# Patient Record
Sex: Female | Born: 1940 | Race: White | Hispanic: No | Marital: Married | Smoking: Never smoker
Health system: Southern US, Community
[De-identification: ages and names within clinical notes are randomized; demographics above are authoritative.]

## PROBLEM LIST (undated history)

## (undated) DIAGNOSIS — K2970 Gastritis, unspecified, without bleeding: Secondary | ICD-10-CM

## (undated) DIAGNOSIS — T7840XA Allergy, unspecified, initial encounter: Secondary | ICD-10-CM

## (undated) DIAGNOSIS — J40 Bronchitis, not specified as acute or chronic: Secondary | ICD-10-CM

## (undated) DIAGNOSIS — B029 Zoster without complications: Secondary | ICD-10-CM

## (undated) DIAGNOSIS — I447 Left bundle-branch block, unspecified: Secondary | ICD-10-CM

## (undated) DIAGNOSIS — M797 Fibromyalgia: Secondary | ICD-10-CM

## (undated) DIAGNOSIS — I519 Heart disease, unspecified: Secondary | ICD-10-CM

## (undated) DIAGNOSIS — Q249 Congenital malformation of heart, unspecified: Secondary | ICD-10-CM

## (undated) DIAGNOSIS — K219 Gastro-esophageal reflux disease without esophagitis: Secondary | ICD-10-CM

## (undated) DIAGNOSIS — E785 Hyperlipidemia, unspecified: Secondary | ICD-10-CM

## (undated) DIAGNOSIS — C801 Malignant (primary) neoplasm, unspecified: Secondary | ICD-10-CM

## (undated) DIAGNOSIS — M1990 Unspecified osteoarthritis, unspecified site: Secondary | ICD-10-CM

## (undated) DIAGNOSIS — K589 Irritable bowel syndrome without diarrhea: Secondary | ICD-10-CM

## (undated) HISTORY — DX: Allergy, unspecified, initial encounter: T78.40XA

## (undated) HISTORY — DX: Unspecified osteoarthritis, unspecified site: M19.90

## (undated) HISTORY — DX: Fibromyalgia: M79.7

## (undated) HISTORY — DX: Irritable bowel syndrome, unspecified: K58.9

## (undated) HISTORY — DX: Hyperlipidemia, unspecified: E78.5

## (undated) HISTORY — PX: GANGLION CYST EXCISION: SHX1691

## (undated) HISTORY — DX: Bronchitis, not specified as acute or chronic: J40

## (undated) HISTORY — PX: TONSILLECTOMY: SHX5217

## (undated) HISTORY — DX: Malignant (primary) neoplasm, unspecified: C80.1

## (undated) HISTORY — DX: Gastritis, unspecified, without bleeding: K29.70

## (undated) HISTORY — DX: Gastro-esophageal reflux disease without esophagitis: K21.9

## (undated) HISTORY — DX: Congenital malformation of heart, unspecified: Q24.9

## (undated) HISTORY — DX: Heart disease, unspecified: I51.9

## (undated) HISTORY — PX: MASTECTOMY, PARTIAL: SHX709

## (undated) HISTORY — DX: Left bundle-branch block, unspecified: I44.7

---

## 1961-06-10 HISTORY — PX: TONSILLECTOMY: SUR1361

## 1999-11-21 ENCOUNTER — Ambulatory Visit (HOSPITAL_COMMUNITY): Admission: RE | Admit: 1999-11-21 | Discharge: 1999-11-21 | Payer: Self-pay | Admitting: Gastroenterology

## 1999-11-28 ENCOUNTER — Encounter: Payer: Self-pay | Admitting: Gynecology

## 1999-11-28 ENCOUNTER — Encounter: Admission: RE | Admit: 1999-11-28 | Discharge: 1999-11-28 | Payer: Self-pay | Admitting: Gynecology

## 2000-10-13 ENCOUNTER — Other Ambulatory Visit: Admission: RE | Admit: 2000-10-13 | Discharge: 2000-10-13 | Payer: Self-pay | Admitting: Gynecology

## 2000-12-25 ENCOUNTER — Encounter: Admission: RE | Admit: 2000-12-25 | Discharge: 2000-12-25 | Payer: Self-pay | Admitting: Gynecology

## 2000-12-25 ENCOUNTER — Encounter: Payer: Self-pay | Admitting: Gynecology

## 2001-10-28 ENCOUNTER — Other Ambulatory Visit: Admission: RE | Admit: 2001-10-28 | Discharge: 2001-10-28 | Payer: Self-pay | Admitting: Gynecology

## 2002-01-26 ENCOUNTER — Ambulatory Visit (HOSPITAL_COMMUNITY): Admission: RE | Admit: 2002-01-26 | Discharge: 2002-01-26 | Payer: Self-pay | Admitting: Gastroenterology

## 2002-04-26 ENCOUNTER — Encounter: Admission: RE | Admit: 2002-04-26 | Discharge: 2002-04-26 | Payer: Self-pay | Admitting: Gynecology

## 2002-04-26 ENCOUNTER — Encounter: Payer: Self-pay | Admitting: Gynecology

## 2002-04-29 ENCOUNTER — Encounter (INDEPENDENT_AMBULATORY_CARE_PROVIDER_SITE_OTHER): Payer: Self-pay | Admitting: *Deleted

## 2002-04-29 ENCOUNTER — Encounter: Admission: RE | Admit: 2002-04-29 | Discharge: 2002-04-29 | Payer: Self-pay | Admitting: Gynecology

## 2002-04-29 ENCOUNTER — Other Ambulatory Visit: Admission: RE | Admit: 2002-04-29 | Discharge: 2002-04-29 | Payer: Self-pay | Admitting: Radiology

## 2002-04-29 ENCOUNTER — Encounter: Payer: Self-pay | Admitting: Gynecology

## 2002-05-12 ENCOUNTER — Encounter: Admission: RE | Admit: 2002-05-12 | Discharge: 2002-05-12 | Payer: Self-pay | Admitting: Surgery

## 2002-05-12 ENCOUNTER — Encounter: Payer: Self-pay | Admitting: Surgery

## 2002-05-13 ENCOUNTER — Ambulatory Visit (HOSPITAL_BASED_OUTPATIENT_CLINIC_OR_DEPARTMENT_OTHER): Admission: RE | Admit: 2002-05-13 | Discharge: 2002-05-14 | Payer: Self-pay | Admitting: Surgery

## 2002-05-13 ENCOUNTER — Encounter (INDEPENDENT_AMBULATORY_CARE_PROVIDER_SITE_OTHER): Payer: Self-pay | Admitting: Specialist

## 2002-05-13 ENCOUNTER — Encounter: Payer: Self-pay | Admitting: Surgery

## 2002-05-15 HISTORY — PX: BREAST SURGERY: SHX581

## 2002-05-25 ENCOUNTER — Ambulatory Visit: Admission: RE | Admit: 2002-05-25 | Discharge: 2002-05-31 | Payer: Self-pay | Admitting: Radiation Oncology

## 2002-06-04 ENCOUNTER — Ambulatory Visit: Admission: RE | Admit: 2002-06-04 | Discharge: 2002-08-22 | Payer: Self-pay | Admitting: Radiation Oncology

## 2002-09-21 ENCOUNTER — Encounter: Payer: Self-pay | Admitting: Oncology

## 2002-09-21 ENCOUNTER — Ambulatory Visit (HOSPITAL_COMMUNITY): Admission: RE | Admit: 2002-09-21 | Discharge: 2002-09-21 | Payer: Self-pay | Admitting: Oncology

## 2002-11-19 ENCOUNTER — Encounter: Payer: Self-pay | Admitting: Oncology

## 2002-11-19 ENCOUNTER — Ambulatory Visit (HOSPITAL_COMMUNITY): Admission: RE | Admit: 2002-11-19 | Discharge: 2002-11-19 | Payer: Self-pay | Admitting: Oncology

## 2003-01-03 ENCOUNTER — Other Ambulatory Visit: Admission: RE | Admit: 2003-01-03 | Discharge: 2003-01-03 | Payer: Self-pay | Admitting: Gynecology

## 2003-05-02 ENCOUNTER — Encounter: Admission: RE | Admit: 2003-05-02 | Discharge: 2003-05-02 | Payer: Self-pay | Admitting: Oncology

## 2004-04-18 ENCOUNTER — Other Ambulatory Visit: Admission: RE | Admit: 2004-04-18 | Discharge: 2004-04-18 | Payer: Self-pay | Admitting: Gynecology

## 2004-05-14 ENCOUNTER — Encounter: Admission: RE | Admit: 2004-05-14 | Discharge: 2004-05-14 | Payer: Self-pay | Admitting: Gynecology

## 2004-07-18 ENCOUNTER — Encounter: Admission: RE | Admit: 2004-07-18 | Discharge: 2004-07-18 | Payer: Self-pay | Admitting: Oncology

## 2004-08-17 ENCOUNTER — Ambulatory Visit: Payer: Self-pay | Admitting: Oncology

## 2004-10-18 ENCOUNTER — Ambulatory Visit: Payer: Self-pay | Admitting: Oncology

## 2005-05-22 ENCOUNTER — Other Ambulatory Visit: Admission: RE | Admit: 2005-05-22 | Discharge: 2005-05-22 | Payer: Self-pay | Admitting: Gynecology

## 2005-05-29 ENCOUNTER — Encounter: Admission: RE | Admit: 2005-05-29 | Discharge: 2005-05-29 | Payer: Self-pay | Admitting: Oncology

## 2005-10-18 ENCOUNTER — Ambulatory Visit: Payer: Self-pay | Admitting: Oncology

## 2005-10-22 LAB — CBC WITH DIFFERENTIAL/PLATELET
BASO%: 0.5 % (ref 0.0–2.0)
Eosinophils Absolute: 0.1 10*3/uL (ref 0.0–0.5)
HCT: 43.3 % (ref 34.8–46.6)
MCHC: 33.4 g/dL (ref 32.0–36.0)
MONO#: 0.8 10*3/uL (ref 0.1–0.9)
NEUT#: 5.8 10*3/uL (ref 1.5–6.5)
NEUT%: 69.5 % (ref 39.6–76.8)
RBC: 4.99 10*6/uL (ref 3.70–5.32)
WBC: 8.3 10*3/uL (ref 3.9–10.0)
lymph#: 1.6 10*3/uL (ref 0.9–3.3)

## 2005-10-22 LAB — COMPREHENSIVE METABOLIC PANEL
ALT: 20 U/L (ref 0–40)
AST: 23 U/L (ref 0–37)
Albumin: 4.6 g/dL (ref 3.5–5.2)
CO2: 24 mEq/L (ref 19–32)
Calcium: 9.9 mg/dL (ref 8.4–10.5)
Chloride: 101 mEq/L (ref 96–112)
Potassium: 4.4 mEq/L (ref 3.5–5.3)

## 2005-10-22 LAB — CANCER ANTIGEN 27.29: CA 27.29: 60 U/mL — ABNORMAL HIGH (ref 0–39)

## 2005-10-31 ENCOUNTER — Encounter: Admission: RE | Admit: 2005-10-31 | Discharge: 2005-10-31 | Payer: Self-pay | Admitting: Oncology

## 2006-05-21 ENCOUNTER — Ambulatory Visit: Payer: Self-pay | Admitting: Oncology

## 2006-06-11 ENCOUNTER — Encounter: Admission: RE | Admit: 2006-06-11 | Discharge: 2006-06-11 | Payer: Self-pay | Admitting: Gynecology

## 2006-06-11 ENCOUNTER — Ambulatory Visit (HOSPITAL_COMMUNITY): Admission: RE | Admit: 2006-06-11 | Discharge: 2006-06-11 | Payer: Self-pay | Admitting: Oncology

## 2006-06-12 LAB — COMPREHENSIVE METABOLIC PANEL
ALT: 19 U/L (ref 0–35)
AST: 19 U/L (ref 0–37)
Calcium: 9.9 mg/dL (ref 8.4–10.5)
Chloride: 104 mEq/L (ref 96–112)
Creatinine, Ser: 0.73 mg/dL (ref 0.40–1.20)
Potassium: 4.3 mEq/L (ref 3.5–5.3)
Sodium: 138 mEq/L (ref 135–145)

## 2006-06-12 LAB — CBC WITH DIFFERENTIAL/PLATELET
BASO%: 0.7 % (ref 0.0–2.0)
EOS%: 2.1 % (ref 0.0–7.0)
MCH: 27 pg (ref 26.0–34.0)
MCHC: 31.7 g/dL — ABNORMAL LOW (ref 32.0–36.0)
RBC: 5.3 10*6/uL (ref 3.70–5.32)
RDW: 13.7 % (ref 11.3–14.5)
lymph#: 1.5 10*3/uL (ref 0.9–3.3)

## 2006-07-28 ENCOUNTER — Ambulatory Visit: Payer: Self-pay | Admitting: Oncology

## 2006-07-31 LAB — CBC WITH DIFFERENTIAL/PLATELET
Basophils Absolute: 0.1 10*3/uL (ref 0.0–0.1)
EOS%: 1.5 % (ref 0.0–7.0)
HGB: 14.9 g/dL (ref 11.6–15.9)
MCH: 28.3 pg (ref 26.0–34.0)
MCV: 82.5 fL (ref 81.0–101.0)
MONO%: 8.9 % (ref 0.0–13.0)
NEUT%: 71.8 % (ref 39.6–76.8)
RDW: 13.4 % (ref 11.3–14.5)

## 2006-07-31 LAB — COMPREHENSIVE METABOLIC PANEL
AST: 21 U/L (ref 0–37)
Alkaline Phosphatase: 160 U/L — ABNORMAL HIGH (ref 39–117)
BUN: 14 mg/dL (ref 6–23)
Creatinine, Ser: 0.96 mg/dL (ref 0.40–1.20)
Potassium: 4.2 mEq/L (ref 3.5–5.3)
Total Bilirubin: 0.3 mg/dL (ref 0.3–1.2)

## 2006-08-04 ENCOUNTER — Other Ambulatory Visit: Admission: RE | Admit: 2006-08-04 | Discharge: 2006-08-04 | Payer: Self-pay | Admitting: Gynecology

## 2006-08-12 ENCOUNTER — Ambulatory Visit (HOSPITAL_COMMUNITY): Admission: RE | Admit: 2006-08-12 | Discharge: 2006-08-12 | Payer: Self-pay | Admitting: Oncology

## 2006-09-24 ENCOUNTER — Ambulatory Visit: Payer: Self-pay | Admitting: Oncology

## 2006-09-25 LAB — CBC WITH DIFFERENTIAL/PLATELET
Basophils Absolute: 0 10*3/uL (ref 0.0–0.1)
Eosinophils Absolute: 0.1 10*3/uL (ref 0.0–0.5)
HCT: 41.8 % (ref 34.8–46.6)
HGB: 14.5 g/dL (ref 11.6–15.9)
LYMPH%: 18.7 % (ref 14.0–48.0)
MCV: 82.5 fL (ref 81.0–101.0)
MONO%: 8.8 % (ref 0.0–13.0)
NEUT#: 4.7 10*3/uL (ref 1.5–6.5)
Platelets: 274 10*3/uL (ref 145–400)

## 2006-09-25 LAB — COMPREHENSIVE METABOLIC PANEL
CO2: 24 mEq/L (ref 19–32)
Calcium: 9.5 mg/dL (ref 8.4–10.5)
Creatinine, Ser: 0.74 mg/dL (ref 0.40–1.20)
Glucose, Bld: 104 mg/dL — ABNORMAL HIGH (ref 70–99)
Total Bilirubin: 0.6 mg/dL (ref 0.3–1.2)
Total Protein: 6.6 g/dL (ref 6.0–8.3)

## 2006-11-10 ENCOUNTER — Ambulatory Visit: Payer: Self-pay | Admitting: Oncology

## 2006-11-10 LAB — CBC WITH DIFFERENTIAL/PLATELET
BASO%: 0.3 % (ref 0.0–2.0)
EOS%: 1.8 % (ref 0.0–7.0)
Eosinophils Absolute: 0.1 10*3/uL (ref 0.0–0.5)
HCT: 43 % (ref 34.8–46.6)
LYMPH%: 15.6 % (ref 14.0–48.0)
MCH: 28.9 pg (ref 26.0–34.0)
MCHC: 34.8 g/dL (ref 32.0–36.0)
MONO#: 0.7 10*3/uL (ref 0.1–0.9)
NEUT%: 71.8 % (ref 39.6–76.8)
Platelets: 252 10*3/uL (ref 145–400)
RBC: 5.19 10*6/uL (ref 3.70–5.32)
WBC: 6.9 10*3/uL (ref 3.9–10.0)

## 2006-11-10 LAB — COMPREHENSIVE METABOLIC PANEL
ALT: 20 U/L (ref 0–35)
AST: 21 U/L (ref 0–37)
Alkaline Phosphatase: 135 U/L — ABNORMAL HIGH (ref 39–117)
Creatinine, Ser: 0.68 mg/dL (ref 0.40–1.20)
Sodium: 138 mEq/L (ref 135–145)
Total Bilirubin: 0.4 mg/dL (ref 0.3–1.2)
Total Protein: 6.8 g/dL (ref 6.0–8.3)

## 2006-11-10 LAB — CANCER ANTIGEN 27.29: CA 27.29: 48 U/mL — ABNORMAL HIGH (ref 0–39)

## 2007-06-30 ENCOUNTER — Ambulatory Visit: Payer: Self-pay | Admitting: Oncology

## 2007-07-02 LAB — COMPREHENSIVE METABOLIC PANEL
AST: 23 U/L (ref 0–37)
BUN: 9 mg/dL (ref 6–23)
Calcium: 9.7 mg/dL (ref 8.4–10.5)
Chloride: 103 mEq/L (ref 96–112)
Creatinine, Ser: 0.74 mg/dL (ref 0.40–1.20)
Total Bilirubin: 0.5 mg/dL (ref 0.3–1.2)

## 2007-07-02 LAB — CBC WITH DIFFERENTIAL/PLATELET
Basophils Absolute: 0 10*3/uL (ref 0.0–0.1)
EOS%: 1.4 % (ref 0.0–7.0)
HCT: 41.6 % (ref 34.8–46.6)
HGB: 14.3 g/dL (ref 11.6–15.9)
LYMPH%: 20.1 % (ref 14.0–48.0)
MCH: 28.4 pg (ref 26.0–34.0)
MCHC: 34.3 g/dL (ref 32.0–36.0)
MCV: 82.9 fL (ref 81.0–101.0)
MONO%: 10.6 % (ref 0.0–13.0)
NEUT%: 67.4 % (ref 39.6–76.8)
Platelets: 303 10*3/uL (ref 145–400)
lymph#: 1.4 10*3/uL (ref 0.9–3.3)

## 2007-07-02 LAB — CANCER ANTIGEN 27.29: CA 27.29: 68 U/mL — ABNORMAL HIGH (ref 0–39)

## 2007-07-07 ENCOUNTER — Encounter: Admission: RE | Admit: 2007-07-07 | Discharge: 2007-07-07 | Payer: Self-pay | Admitting: Oncology

## 2008-06-23 ENCOUNTER — Emergency Department (HOSPITAL_BASED_OUTPATIENT_CLINIC_OR_DEPARTMENT_OTHER): Admission: EM | Admit: 2008-06-23 | Discharge: 2008-06-23 | Payer: Self-pay | Admitting: Emergency Medicine

## 2008-07-12 ENCOUNTER — Encounter: Admission: RE | Admit: 2008-07-12 | Discharge: 2008-07-12 | Payer: Self-pay | Admitting: Gynecology

## 2008-08-08 HISTORY — PX: CARDIAC CATHETERIZATION: SHX172

## 2008-09-06 ENCOUNTER — Ambulatory Visit (HOSPITAL_COMMUNITY): Admission: RE | Admit: 2008-09-06 | Discharge: 2008-09-06 | Payer: Self-pay | Admitting: *Deleted

## 2008-09-24 DIAGNOSIS — D0590 Unspecified type of carcinoma in situ of unspecified breast: Secondary | ICD-10-CM | POA: Insufficient documentation

## 2008-09-24 DIAGNOSIS — I2510 Atherosclerotic heart disease of native coronary artery without angina pectoris: Secondary | ICD-10-CM | POA: Insufficient documentation

## 2008-09-24 DIAGNOSIS — I4460 Unspecified fascicular block: Secondary | ICD-10-CM

## 2008-09-26 ENCOUNTER — Encounter: Payer: Self-pay | Admitting: Internal Medicine

## 2008-09-26 ENCOUNTER — Ambulatory Visit: Payer: Self-pay | Admitting: Internal Medicine

## 2008-09-26 DIAGNOSIS — R000 Tachycardia, unspecified: Secondary | ICD-10-CM

## 2009-04-19 ENCOUNTER — Encounter: Admission: RE | Admit: 2009-04-19 | Discharge: 2009-04-19 | Payer: Self-pay | Admitting: Gynecology

## 2009-09-06 ENCOUNTER — Encounter: Admission: RE | Admit: 2009-09-06 | Discharge: 2009-09-06 | Payer: Self-pay | Admitting: Gynecology

## 2010-04-30 ENCOUNTER — Ambulatory Visit: Payer: Self-pay | Admitting: Cardiology

## 2010-05-09 ENCOUNTER — Encounter: Admission: RE | Admit: 2010-05-09 | Discharge: 2010-05-09 | Payer: Self-pay | Admitting: Gastroenterology

## 2010-07-01 ENCOUNTER — Encounter: Payer: Self-pay | Admitting: Oncology

## 2010-09-25 ENCOUNTER — Other Ambulatory Visit: Payer: Self-pay | Admitting: *Deleted

## 2010-09-25 DIAGNOSIS — R000 Tachycardia, unspecified: Secondary | ICD-10-CM

## 2010-09-25 MED ORDER — NADOLOL 20 MG PO TABS: 20.0000 mg | ORAL_TABLET | Freq: Every day | ORAL | Status: DC

## 2010-09-25 NOTE — Telephone Encounter (Signed)
escribe medication per fax request  

## 2010-10-08 ENCOUNTER — Other Ambulatory Visit: Payer: Self-pay | Admitting: Gynecology

## 2010-10-08 DIAGNOSIS — Z1231 Encounter for screening mammogram for malignant neoplasm of breast: Secondary | ICD-10-CM

## 2010-10-23 ENCOUNTER — Ambulatory Visit
Admission: RE | Admit: 2010-10-23 | Discharge: 2010-10-23 | Disposition: A | Discharge status: Home / Self Care | Payer: Medicare Other | Source: Ambulatory Visit | Attending: Gynecology | Admitting: Gynecology

## 2010-10-23 DIAGNOSIS — Z1231 Encounter for screening mammogram for malignant neoplasm of breast: Secondary | ICD-10-CM

## 2010-10-23 NOTE — Cardiovascular Report (Signed)
[name], [name]                 ACCOUNT NO.:  [id number]   MEDICAL RECORD NO.:  [id number]          PATIENT TYPE:  OIB   LOCATION:  2899                         FACILITY:  [facility]   PHYSICIAN:  Kristina Schroeder., Kristina SchroederDATE OF BIRTH:  Feb 04, 1941   DATE OF PROCEDURE:  09/06/2008  DATE OF DISCHARGE:  09/06/2008                            CARDIAC CATHETERIZATION   INDICATION FOR PROCEDURE:  Abnormal stress test, new left bundle-branch  block on ECG.   HISTORY OF PRESENT ILLNESS:  Kristina Schroeder is a very pleasant 70 year old  white female with increasing dyspnea on exertion.  She presented to the  office for evaluation.  Her ECG showed new left bundle-branch block.  She was then referred for nuclear medicine study.  This study showed a  reversible mid, distal, anterior, and periapical wall defect with  moderate LV dysfunction, her EF was 29%.  Because of these findings, she  is referred for cardiac catheterization.   PROCEDURE REPORT:  The patient was brought to the cardiac cath lab, and  after appropriate informed consent, she was prepped and draped in  sterile fashion.  Approximately 10 mL of 1% lidocaine was used for local  anesthesia.  A 5-French sheath was placed in the right femoral artery  without difficulty.  Coronary angiography, LV angiography were then  performed.  The patient tolerated the procedure well and was transferred  from the cardiac cath lab in stable condition.   FINDINGS:  1. Left Main:  Long, normal appearing.  2. LAD:  Moderate-sized vessel with mild luminal irregularities.  3. D1:  Moderate-sized vessel with mild luminal irregularities.  4. LCX:  Nondominant with mild luminal irregularities.  5. OM-1/OM-2/OM-3:  All patent with mild luminal irregularities.  6. Ramus intermedius:  Small vessel, patent with mild luminal      irregularities.  7. RCA:  Dominant with mild luminal irregularities.  8. PDA is ectatic with mild luminal irregularities, PLV is patent  with      mild luminal irregularities.  9. LV:  EF is 35-40% with mild-to-moderate global hypokinesis.  LVEDP      is 6 mmHg.   IMPRESSION:  1. Nonobstructive coronary arteries as described above.  2. Moderate left ventricular dysfunction, ejection fraction 35-40%.   PLAN:  At this time, I would recommend aggressive risk factor  modification and treatment for LV dysfunction as indicated.      Kristina Schroeder., M.D.  Electronically Signed    TWK/MEDQ  D:  09/06/2008  T:  09/07/2008  Job:  [id number]

## 2010-10-26 NOTE — Op Note (Signed)
NAMESUEKO, DIMICHELE                           ACCOUNT NO.:  [id number]   MEDICAL RECORD NO.:  [id number]                   PATIENT TYPE:  AMB   LOCATION:  DSC                                  FACILITY:  [facility]   PHYSICIAN:  Currie Paris, M.D.           DATE OF BIRTH:  Mar 07, 1941   DATE OF PROCEDURE:  05/13/2002  DATE OF DISCHARGE:                                 OPERATIVE REPORT   PREOPERATIVE DIAGNOSIS:  Carcinoma left breast upper outer quadrant.   POSTOPERATIVE DIAGNOSIS:  Carcinoma left breast upper outer quadrant.   OPERATION:  Left partial mastectomy with blue dye injection and sentinel  node dissection.   SURGEON:  Currie Paris, M.D.   ANESTHESIA:  General.   CLINICAL HISTORY:  This patient is a 70 year old lady recently found to have  a left breast cancer by core biopsy that measured about 1.2 cm by ultrasound  criteria.  It could be felt but it was somewhat soft and a little bit  difficult to assess completely in terms of size by palpation.   DESCRIPTION OF PROCEDURE:  The patient was seen in the holding area and had  no further questions.  The left side was identified and marked as the  operative side.  The patient was taken to the operating room and after  satisfactory general anesthesia had been obtained, some Lymphazurin blue  using about 4 cc was infiltrated subareolarly.  The mass was palpated and  using ultrasound confirmed and marked.  The breast was prepped and draped.  Using the Neoprobe, I found a hot area in the axilla and made a transverse  incision and as soon as I got through with subcutaneous tissues, identified  a blue lymphatic and almost immediately a blue lymph node.  The lymph node  had counts of about 1800 once it was removed which was done with the  cautery.  There were no other hot areas and no blue areas noted but just  adjacent where I had taken this first node out was a second fairly firm  node, the first one being more normal  in appearance.  I sent the second one  out as well because it grossly appeared abnormal, but this was the only  other abnormality noted in the axilla.   Attention was returned to the breast and I made a curvilinear incision over  the mass taking a thin ellipse of skin to make sure I had a good superficial  margin and then using cautery, went down around the mass and came under it  getting down close to the chest wall.  There was seen to be some tethering  towards the axilla, but no obvious tumor.  However, along the medial  inferior aspect, I got fairly close to the tumor but thought I was around  it.  Nevertheless, once I had this area out I took another cm of tissue at  the area where I was close making sure I got down to and included muscle  fascia but not muscle, itself.  Once all this was out, it went for  examination by the pathologist.  The wound was irrigated, checked for  hemostasis, and once everything was dry, I put some metal clips in using two  large to mark superficial and deep margins as well as four small clips to  mark the quadrants of excision.  The deeper layers were left unclosed, but  the superficial subcu was closed followed by the skin with 4-0 Monocryl.  The axillary incision was closed in a similar fashion.  I used some Marcaine  in each incision to help with postoperative analgesia.   Speaking to Dr. Clelia Croft, the initial hot lymph node was totally negative.  There was some reactive changes in the second node, but she could not make a  definite diagnosis of cancer with that.  The margins appeared negative to  her on the original lumpectomy specimen with the closest being about 5 mm  and that being from the area where I took additional tissue.   At this point, I concluded the case.  Sterile dressings were applied.  The  patient was taken to the recovery room in satisfactory condition.  All  counts were correct.                                                Currie Paris, M.D.    CJS/MEDQ  D:  05/13/2002  T:  05/13/2002  Job:  [id number]   cc:   Meredith Staggers, M.D.  [address]  [place]  [place] [address]  Fax: [phone]   Gretta Cool, M.D.  [address] [place]  [place] [address]  Fax: [phone]

## 2010-10-26 NOTE — Procedures (Signed)
[facility]  Patient:    Kristina Schroeder, Kristina Schroeder                   MRN: [id number] Proc. Date: 11/21/99 Adm. Date:  [id number] Disc. Date: [id number] Attending:  Deneen Harts CC:         Gretta Cool, M.D.             Meredith Staggers, M.D.                           Procedure Report  PROCEDURE:  Colonoscopy.  INDICATION FOR PROCEDURE: A 70 year old white female with intermittent hematochezia. Abdominal pain, cramping/gripping typically worse in the morning. Bowel habits typically 5-6 formed stools daily. Prior neoplasia surveillance with flexible sigmoidoscopy performed by Dr. Francesca Oman 1.5 years ago. Reportedly normal.  DESCRIPTION OF PROCEDURE:  After reviewing the nature of the procedure with the patient including potential risks and complications, and after discussing alternative methods of diagnosis and treatment, informed consent was signed.  The patient was premedicated receiving IV sedation totaling Versed 7 mg, fentanyl 62.5 mcg administered in divided doses prior to the onset of the procedure.  Using the Olympus pediatric PCF-140L video colonoscope, the rectum was intubated after a normal digital examination. The scope was advanced without difficulty around the entire length of the colon to the cecum identified by the appendiceal orifice and ileocecal valve. Excellent preparation throughout, patient having used Visicol tablets.  The scope was slowly withdrawn after the terminal ileum was inspected over its distal 10 cm. This was entirely normal.  The colon was then examined in a retrograde manner with close inspection throughout. Excellent visualization without abnormality other than early internal hemorrhoids, noninflamed.  No evidence of neoplasia, mucosal inflammation, vascular lesion, or diverticular disease.  The colon was decompressed, scope withdrawn. The patient tolerated the procedure without difficulty being  maintained on DataScope monitor and low flow oxygen throughout.  Time 1, technical 1, preparation 1 (Visicol), total score equal 3.  ASSESSMENT: 1. Normal colonoscopy and ileoscopy. 2. Internal hemorrhoids--probable source of intermittent hematochezia. 3. Altered bowel movements probably secondary to irritable bowel syndrome.  RECOMMENDATION: 1. Repeat colonoscopy in 10 years for neoplasia surveillance. 2. Annual guaiac--Dr. Nicholas Lose or Dr. Dayton Scrape. 3. Irritable bowel syndrome regimen--NuLev, Levbid as prescribed. 4. Return office visit p.r.n. 5. Rectal care p.r.n. DD:  11/21/99 TD:  11/24/99 Job: [id number]

## 2010-11-06 ENCOUNTER — Other Ambulatory Visit: Payer: Self-pay | Admitting: *Deleted

## 2010-11-06 MED ORDER — DILTIAZEM HCL ER COATED BEADS 180 MG PO CP24: ORAL_CAPSULE | ORAL | Status: DC

## 2010-11-06 NOTE — Telephone Encounter (Signed)
escribe medication per fax request  

## 2010-11-29 ENCOUNTER — Encounter: Payer: Self-pay | Admitting: Cardiology

## 2011-01-21 ENCOUNTER — Encounter: Payer: Self-pay | Admitting: Cardiology

## 2011-01-24 ENCOUNTER — Other Ambulatory Visit: Payer: Self-pay | Admitting: *Deleted

## 2011-01-24 MED ORDER — ROSUVASTATIN CALCIUM 5 MG PO TABS: 5.0000 mg | ORAL_TABLET | Freq: Every day | ORAL | Status: DC

## 2011-01-24 NOTE — Telephone Encounter (Signed)
escribe medication per fax request  

## 2011-01-31 ENCOUNTER — Encounter: Payer: Self-pay | Admitting: Cardiology

## 2011-01-31 ENCOUNTER — Ambulatory Visit (INDEPENDENT_AMBULATORY_CARE_PROVIDER_SITE_OTHER): Payer: Medicare Other | Admitting: Cardiology

## 2011-01-31 VITALS — BP 126/80 | HR 76 | Ht 62.5 in | Wt 164.6 lb

## 2011-01-31 DIAGNOSIS — R000 Tachycardia, unspecified: Secondary | ICD-10-CM

## 2011-01-31 DIAGNOSIS — I2510 Atherosclerotic heart disease of native coronary artery without angina pectoris: Secondary | ICD-10-CM

## 2011-01-31 DIAGNOSIS — E7800 Pure hypercholesterolemia, unspecified: Secondary | ICD-10-CM

## 2011-01-31 NOTE — Patient Instructions (Signed)
Continue your current medications.  We will see you again in 6 months with fasting lab work.

## 2011-02-01 NOTE — Assessment & Plan Note (Signed)
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 We will continue on her current therapy.

## 2011-02-01 NOTE — Progress Notes (Signed)
Kristina Schroeder Date of Birth: [birth year]   History of Present Illness: Kristina Schroeder is seen today for followup. She reports that she has been doing very well. On the combination of diltiazem and beta blockers she has had no tachycardia. Her energy level is good. She's had no dizziness or syncope. She denies any chest pain or shortness of breath. Overall she feels that she is doing very well.  Current Outpatient Prescriptions on File Prior to Visit  Medication Sig Dispense Refill  . Acetaminophen (TYLENOL ARTHRITIS PAIN PO) Take by mouth 4 (four) times daily.        Marland Kitchen BIOTIN 5000 PO Take by mouth daily.        . cetirizine (ZYRTEC) 10 MG tablet Take 10 mg by mouth daily.        . Cholecalciferol (VITAMIN D-3) 5000 UNITS TABS Take by mouth daily.        Marland Kitchen diltiazem (CARDIZEM CD) 180 MG 24 hr capsule Take 2 capsules daily  60 capsule  3  . DULoxetine (CYMBALTA) 60 MG capsule Take 60 mg by mouth 2 (two) times daily.        Marland Kitchen ezetimibe (ZETIA) 10 MG tablet Take 10 mg by mouth daily. Take 1/2 Tab      . famotidine (PEPCID) 40 MG tablet Take 40 mg by mouth 2 (two) times daily.        . fexofenadine (ALLEGRA) 180 MG tablet Take 180 mg by mouth daily.        . fish oil-omega-3 fatty acids 1000 MG capsule Take 2 g by mouth daily.        . fluticasone (FLONASE) 50 MCG/ACT nasal spray Place 2 sprays into the nose daily.        Marland Kitchen gabapentin (NEURONTIN) 100 MG capsule Take 100 mg by mouth 2 (two) times daily.        . hyoscyamine (LEVSIN) 0.125 MG/5ML ELIX Take 0.125 mg by mouth.        . lubiprostone (AMITIZA) 8 MCG capsule Take 8 mcg by mouth daily with breakfast.        . methocarbamol (ROBAXIN) 500 MG tablet Take 500 mg by mouth 2 (two) times daily.        . nadolol (CORGARD) 20 MG tablet Take 1 tablet (20 mg total) by mouth daily.  30 tablet  5  . niacin 250 MG tablet Take 250 mg by mouth daily.        . Probiotic Product (PROBIOTIC PO) Take by mouth daily.        . rosuvastatin (CRESTOR) 5 MG  tablet Take 1 tablet (5 mg total) by mouth daily.  30 tablet  5  . zolpidem (AMBIEN) 10 MG tablet Take 10 mg by mouth. 1/2 tablet q.h.s.       Marland Kitchen aspirin 81 MG tablet Take 81 mg by mouth daily.        . Flaxseed, Linseed, (FLAX SEED OIL PO) Take by mouth daily.        Marland Kitchen omeprazole (PRILOSEC) 20 MG capsule Take 20 mg by mouth 2 (two) times daily.          Allergies  Allergen Reactions  . Demerol   . Hydrocodone-Acetaminophen   . Meperidine Hcl   . Morphine   . Morphine And Related   . Vioxx (Rofecoxib)     Past Medical History  Diagnosis Date  . Bundle branch block left   . Left ventricular dysfunction   . Dyslipidemia   .  IBS (irritable bowel syndrome)   . GERD (gastroesophageal reflux disease)   . Fibromyalgia   . Degenerative joint disease   . Cancer     Breast  . Gastritis     Past Surgical History  Procedure Date  . Cardiac catheterization 08/2008  . Mastectomy, partial     Status post left   . Tonsillectomy   . Ganglion cyst excision     History  Smoking status  . Never Smoker   Smokeless tobacco  . Not on file    History  Alcohol Use No    Family History  Problem Relation Age of Onset  . Heart disease Mother   . Heart disease Sister     Review of Systems: As noted history of present illness.  All other systems were reviewed and are negative.  Physical Exam: BP 126/80  Pulse 76  Ht 5' 2.5" (1.588 m)  Wt 164 lb 9.6 oz (74.662 kg)  BMI 29.63 kg/m2 The patient is alert and oriented x 3.  The mood and affect are normal.  The skin is warm and dry.  Color is normal.  The HEENT exam reveals that the sclera are nonicteric.  The mucous membranes are moist.  The carotids are 2+ without bruits.  There is no thyromegaly.  There is no JVD.  The lungs are clear.  The chest wall is non tender.  The heart exam reveals a regular rate with a normal S1 and S2.  There are no murmurs, gallops, or rubs.  The PMI is not displaced.   Abdominal exam reveals good bowel  sounds.  There is no guarding or rebound.  There is no hepatosplenomegaly or tenderness.  There are no masses.  Exam of the legs reveal no clubbing, cyanosis, or edema.  The legs are without rashes.  The distal pulses are intact.  Cranial nerves II - XII are intact.  Motor and sensory functions are intact.  The gait is normal. LABORATORY DATA:   Assessment / Plan:

## 2011-02-01 NOTE — Assessment & Plan Note (Signed)
Ejection fraction of 45-50% by echocardiogram in March of 2011. This is a significant improvement from her cardiac catheterization in 2010 when it was 35-40% and nuclear stress testing at that time which showed an ejection fraction of 29%. She is asymptomatic. We will continue with her current therapy.

## 2011-03-12 ENCOUNTER — Other Ambulatory Visit: Payer: Self-pay | Admitting: *Deleted

## 2011-03-12 MED ORDER — DILTIAZEM HCL ER COATED BEADS 180 MG PO CP24: ORAL_CAPSULE | ORAL | Status: DC

## 2011-03-15 ENCOUNTER — Telehealth: Payer: Self-pay | Admitting: Cardiology

## 2011-03-15 NOTE — Telephone Encounter (Signed)
Pt call c/o of BP 109/54 and HR 80. Pt would like to speak with nurse regarding this. Please call back.

## 2011-03-15 NOTE — Telephone Encounter (Signed)
10/5--pt calling stating she has fatigue and it seems worse lately and she thinks it is because her BP is too low--she gave me readings of 120/80 and 109/54--with pulse of 80bpm--advised that  Both readings are good, but if she wants to see dr jordon we could make an appoint--pt states she does not need an appoint--has one in 5 months and is just happy to know the BP is with-in normal limits--nt

## 2011-03-25 ENCOUNTER — Other Ambulatory Visit: Payer: Self-pay | Admitting: Cardiology

## 2011-05-15 ENCOUNTER — Other Ambulatory Visit: Payer: Self-pay | Admitting: *Deleted

## 2011-05-15 MED ORDER — EZETIMIBE 10 MG PO TABS: 10.0000 mg | ORAL_TABLET | Freq: Every day | ORAL | Status: DC

## 2011-05-29 ENCOUNTER — Emergency Department (HOSPITAL_BASED_OUTPATIENT_CLINIC_OR_DEPARTMENT_OTHER)
Admission: EM | Admit: 2011-05-29 | Discharge: 2011-05-29 | Disposition: A | Discharge status: Home / Self Care | Payer: Medicare Other | Attending: Emergency Medicine | Admitting: Emergency Medicine

## 2011-05-29 ENCOUNTER — Encounter (HOSPITAL_BASED_OUTPATIENT_CLINIC_OR_DEPARTMENT_OTHER): Payer: Self-pay | Admitting: *Deleted

## 2011-05-29 ENCOUNTER — Emergency Department (INDEPENDENT_AMBULATORY_CARE_PROVIDER_SITE_OTHER): Payer: Medicare Other

## 2011-05-29 DIAGNOSIS — R52 Pain, unspecified: Secondary | ICD-10-CM

## 2011-05-29 DIAGNOSIS — E785 Hyperlipidemia, unspecified: Secondary | ICD-10-CM | POA: Insufficient documentation

## 2011-05-29 DIAGNOSIS — J209 Acute bronchitis, unspecified: Secondary | ICD-10-CM | POA: Insufficient documentation

## 2011-05-29 DIAGNOSIS — R0989 Other specified symptoms and signs involving the circulatory and respiratory systems: Secondary | ICD-10-CM

## 2011-05-29 DIAGNOSIS — R05 Cough: Secondary | ICD-10-CM

## 2011-05-29 DIAGNOSIS — K589 Irritable bowel syndrome without diarrhea: Secondary | ICD-10-CM | POA: Insufficient documentation

## 2011-05-29 DIAGNOSIS — R059 Cough, unspecified: Secondary | ICD-10-CM

## 2011-05-29 DIAGNOSIS — I517 Cardiomegaly: Secondary | ICD-10-CM

## 2011-05-29 MED ORDER — GUAIFENESIN-CODEINE 100-10 MG/5ML PO SYRP: ORAL_SOLUTION | ORAL | Status: DC

## 2011-05-29 MED ORDER — AZITHROMYCIN 250 MG PO TABS: ORAL_TABLET | ORAL | Status: DC

## 2011-05-29 NOTE — ED Notes (Signed)
Pt up and redressed-EDP [place] at [facility] with d/c instructions and rx

## 2011-05-29 NOTE — ED Provider Notes (Signed)
History     CSN: [id number] Arrival date & time: 05/29/2011  4:37 PM   First MD Initiated Contact with Patient 05/29/11 1751      Chief Complaint  Patient presents with  . URI    (Consider location/radiation/quality/duration/timing/severity/associated sxs/prior treatment) HPI Comments: Patient is a 70 year old woman who had the flu some 3 weeks ago. She has been coughing up green stuff. Has been no fever. She has continued chest congestion and a hacking cough she's had headache for a week. He says symptomatic treatment from the doctor with Jerilynn Som, without relief. She therefore seeks evaluation.  Patient is a 70 y.o. female presenting with cough. The history is provided by the patient. No language interpreter was used.  Cough This is a new problem. The current episode started more than 1 week ago. The problem occurs hourly. The problem has not changed since onset.The cough is productive of sputum. There has been no fever. Associated symptoms include chest pain. Associated symptoms comments: She has anterior pleuritic chest pain.. Treatments tried: She has taken Tessalon Perles without relief.    Past Medical History  Diagnosis Date  . Bundle branch block left   . Left ventricular dysfunction   . Dyslipidemia   . IBS (irritable bowel syndrome)   . GERD (gastroesophageal reflux disease)   . Fibromyalgia   . Degenerative joint disease   . Cancer     Breast  . Gastritis     Past Surgical History  Procedure Date  . Cardiac catheterization 08/2008  . Mastectomy, partial     Status post left   . Tonsillectomy   . Ganglion cyst excision     Family History  Problem Relation Age of Onset  . Heart disease Mother   . Heart disease Sister     History  Substance Use Topics  . Smoking status: Never Smoker   . Smokeless tobacco: Not on file  . Alcohol Use: No    OB History    Grav Para Term Preterm Abortions TAB SAB Ect Mult Living                  Review of  Systems  Constitutional: Negative.  Negative for fever.  HENT: Negative.   Eyes: Negative.   Respiratory: Positive for cough.   Cardiovascular: Positive for chest pain.  Gastrointestinal: Negative.   Genitourinary: Negative.   Musculoskeletal: Negative.   Skin: Negative.   Neurological: Negative.   Psychiatric/Behavioral: Negative.     Allergies  Demerol; Hydrocodone-acetaminophen; Meperidine hcl; Morphine; Morphine and related; and Vioxx  Home Medications   Current Outpatient Rx  Name Route Sig Dispense Refill  . BIOTIN 5000 PO Oral Take 5,000 mg by mouth daily.     Marland Kitchen VITAMIN D 1000 UNITS PO TABS Oral Take 2,000 Units by mouth daily.      Marland Kitchen COENZYME Q10 200 MG PO TABS Oral Take 1 tablet by mouth daily.      Marland Kitchen DILTIAZEM HCL ER COATED BEADS 180 MG PO CP24  Take 2 capsules daily 60 capsule 5  . DULOXETINE HCL 60 MG PO CPEP Oral Take 60 mg by mouth 2 (two) times daily.      Marland Kitchen EZETIMIBE 10 MG PO TABS Oral Take 1 tablet (10 mg total) by mouth daily. Take 1/2 Tab 90 tablet 3  . FAMOTIDINE 40 MG PO TABS Oral Take 40 mg by mouth 2 (two) times daily.      . OMEGA-3 FATTY ACIDS 1000 MG PO CAPS Oral  Take 1 g by mouth daily.     Marland Kitchen GABAPENTIN 100 MG PO CAPS Oral Take 100 mg by mouth at bedtime.     Marland Kitchen HYOSCYAMINE SULFATE 0.125 MG/5ML PO ELIX Oral Take 0.125 mg by mouth every 4 (four) hours as needed. For IBS    . LUBIPROSTONE 8 MCG PO CAPS Oral Take 8 mcg by mouth daily with breakfast.      . METHOCARBAMOL 500 MG PO TABS Oral Take 500 mg by mouth 2 (two) times daily.      Marland Kitchen NADOLOL 20 MG PO TABS  TAKE 1 TABLET ONCE DAILY. 30 tablet 5  . NIACIN 250 MG PO TABS Oral Take 250 mg by mouth daily.      Marland Kitchen PANTOPRAZOLE SODIUM 40 MG PO TBEC Oral Take 40 mg by mouth daily.      Marland Kitchen PSEUDOEPHEDRINE-GUAIFENESIN 60-600 MG PO TB12 Oral Take 1 tablet by mouth every 12 (twelve) hours.      Marland Kitchen ROSUVASTATIN CALCIUM 5 MG PO TABS Oral Take 1 tablet (5 mg total) by mouth daily. 30 tablet 5  . ZOLPIDEM TARTRATE 10 MG  PO TABS Oral Take 5 mg by mouth at bedtime as needed. For sleep    . AZITHROMYCIN 250 MG PO TABS  Take 2 tablets today, then take one tablet per day for the next 4 days. 6 tablet 0  . GUAIFENESIN-CODEINE 100-10 MG/5ML PO SYRP  Take 2 teaspoons every 4 hours if needed for cough. 120 mL 0    BP 144/85  Pulse 90  Temp 98.6 F (37 C)  Resp 18  Ht 5\' 3"  (1.6 m)  Wt 160 lb (72.576 kg)  BMI 28.34 kg/m2  SpO2 96%  Physical Exam  Nursing note and vitals reviewed. Constitutional: She is oriented to person, place, and time.       Pleasant elderly lady in no distress. She has a dry cough.  HENT:  Head: Normocephalic and atraumatic.  Right Ear: External ear normal.  Left Ear: External ear normal.  Mouth/Throat: Oropharynx is clear and moist.  Eyes: Conjunctivae and EOM are normal. Pupils are equal, round, and reactive to light.  Neck: Normal range of motion. Neck supple.  Cardiovascular: Normal rate, regular rhythm and normal heart sounds.   Pulmonary/Chest: Effort normal and breath sounds normal. She has no wheezes. She has no rales. She exhibits no tenderness.  Abdominal: Soft. Bowel sounds are normal.  Musculoskeletal: Normal range of motion.  Lymphadenopathy:    She has no cervical adenopathy.  Neurological: She is alert and oriented to person, place, and time.       No sensory or motor deficit.  Skin: Skin is warm and dry.  Psychiatric: She has a normal mood and affect. Her behavior is normal.    ED Course  Procedures (including critical care time)  Labs Reviewed - No data to display Dg Chest 2 View  05/29/2011  *RADIOLOGY REPORT*  Clinical Data: Cough, congestion, body aches  CHEST - 2 VIEW  Comparison: None Correlation:  CT chest 08/12/2006  Findings: Enlargement of cardiac silhouette. Tortuous aorta with atherosclerotic calcification. Pulmonary vascular congestion. Emphysematous changes with scattered interstitial prominence greatest in left lower lobe. Retrosternal soft tissue  density on lateral view, mass not excluded, area measuring approximately 2.5 x 2.6 cm in size. Bones diffusely demineralized.  IMPRESSION: Enlargement of cardiac silhouette. Emphysematous changes with scattered interstitial changes of uncertain acuity; acute left basilar infiltrate not completely excluded. Retrosternal soft tissue density 2.5 x 2.6 cm  in size. Recommend CT chest, with contrast if the patient's renal function permits, to evaluate.  Original Report Authenticated By: Lollie Marrow, M.D.   6:21 PM Patient was seen and had physical examination. She was prescribed a Z-Pak and Robitussin-AC for cough. I questioned her on her allergy history, and hydrocodone makes her nauseated. He decided it would be reasonable to try Robitussin-AC where Jerilynn Som had been ineffective for her cough. Her chest x-ray showed a retrosternal lesion of unknown cause. She will need to have this checked further, with a CT of her chest. She can get this ordered through her primary care physician, Selena Batten M.D.  1. Acute bronchitis          Carleene Cooper III, MD 05/29/11 Rickey Primus

## 2011-05-29 NOTE — ED Notes (Signed)
Pt c/o cold symptoms x 5 weeks, finished tamiflu x 2 weeks ago w/ cont symptoms

## 2011-06-13 ENCOUNTER — Telehealth: Payer: Self-pay

## 2011-06-13 ENCOUNTER — Other Ambulatory Visit: Payer: Self-pay

## 2011-06-13 MED ORDER — METOPROLOL SUCCINATE ER 50 MG PO TB24: 50.0000 mg | ORAL_TABLET | Freq: Every day | ORAL | Status: DC

## 2011-06-13 NOTE — Telephone Encounter (Signed)
[facility] faxed a notification patient's Nadolol on indefinite backorder.Spoke with Dr.Jordan Metoprolol 50 mg daily prescribed.Patient was called and was told.

## 2011-07-17 ENCOUNTER — Other Ambulatory Visit: Payer: Self-pay | Admitting: Cardiology

## 2011-09-04 ENCOUNTER — Other Ambulatory Visit (INDEPENDENT_AMBULATORY_CARE_PROVIDER_SITE_OTHER): Payer: Medicare Other

## 2011-09-04 ENCOUNTER — Encounter: Payer: Self-pay | Admitting: Cardiology

## 2011-09-04 ENCOUNTER — Ambulatory Visit (INDEPENDENT_AMBULATORY_CARE_PROVIDER_SITE_OTHER): Payer: Medicare Other | Admitting: Cardiology

## 2011-09-04 VITALS — BP 124/70 | HR 72 | Ht 63.0 in | Wt 169.0 lb

## 2011-09-04 DIAGNOSIS — E7800 Pure hypercholesterolemia, unspecified: Secondary | ICD-10-CM

## 2011-09-04 DIAGNOSIS — I519 Heart disease, unspecified: Secondary | ICD-10-CM

## 2011-09-04 DIAGNOSIS — I2510 Atherosclerotic heart disease of native coronary artery without angina pectoris: Secondary | ICD-10-CM

## 2011-09-04 DIAGNOSIS — R000 Tachycardia, unspecified: Secondary | ICD-10-CM

## 2011-09-04 DIAGNOSIS — I498 Other specified cardiac arrhythmias: Secondary | ICD-10-CM

## 2011-09-04 LAB — BASIC METABOLIC PANEL
Chloride: 104 mEq/L (ref 96–112)
GFR: 86.36 mL/min (ref >60.00)
Glucose, Bld: 119 mg/dL — ABNORMAL HIGH (ref 70–99)
Potassium: 4.2 mEq/L (ref 3.5–5.1)
Sodium: 139 mEq/L (ref 135–145)

## 2011-09-04 LAB — HEPATIC FUNCTION PANEL
ALT: 18 U/L (ref 0–35)
AST: 24 U/L (ref 0–37)
Albumin: 4 g/dL (ref 3.5–5.2)
Alkaline Phosphatase: 119 U/L — ABNORMAL HIGH (ref 39–117)
Total Bilirubin: 0.4 mg/dL (ref 0.3–1.2)

## 2011-09-04 LAB — LIPID PANEL
Cholesterol: 129 mg/dL (ref 0–200)
VLDL: 23.8 mg/dL (ref 0.0–40.0)

## 2011-09-04 MED ORDER — GUAIFENESIN ER 600 MG PO TB12: 600.0000 mg | ORAL_TABLET | Freq: Two times a day (BID) | ORAL | Status: AC

## 2011-09-04 NOTE — Assessment & Plan Note (Signed)
She has a history of inappropriate sinus tachycardia well controlled on a combination of diltiazem and nadolol. Continue current therapy.

## 2011-09-04 NOTE — Patient Instructions (Signed)
Continue your current medication.  We will call with the results of your lab work  I will see you again in 1 year.

## 2011-09-04 NOTE — Progress Notes (Signed)
Kristina Schroeder Date of Birth: 02-Oct-1940   History of Present Illness: Kristina Schroeder is seen today for followup. She reports that she has been doing very well. On the combination of diltiazem and beta blockers she has had no tachycardia. Her energy level is good. She did have a bad case of bronchitis in December and still has a mild cough.  Current Outpatient Prescriptions on File Prior to Visit  Medication Sig Dispense Refill  . BIOTIN 5000 PO Take 5,000 mg by mouth daily.       . cholecalciferol (VITAMIN D) 1000 UNITS tablet Take 2,000 Units by mouth daily.        . CRESTOR 5 MG tablet TAKE 1 TABLET ONCE DAILY.  30 each  5  . diltiazem (CARDIZEM CD) 180 MG 24 hr capsule Take 2 capsules daily  60 capsule  5  . DULoxetine (CYMBALTA) 60 MG capsule Take 60 mg by mouth 2 (two) times daily.        Marland Kitchen ezetimibe (ZETIA) 10 MG tablet Take 1 tablet (10 mg total) by mouth daily. Take 1/2 Tab  90 tablet  3  . famotidine (PEPCID) 40 MG tablet Take 40 mg by mouth 2 (two) times daily.        Marland Kitchen gabapentin (NEURONTIN) 100 MG capsule Take 100 mg by mouth at bedtime.       Marland Kitchen guaiFENesin-codeine (ROBITUSSIN AC) 100-10 MG/5ML syrup Take 2 teaspoons every 4 hours if needed for cough.  120 mL  0  . hyoscyamine (LEVSIN) 0.125 MG/5ML ELIX Take 0.125 mg by mouth every 4 (four) hours as needed. For IBS      . lubiprostone (AMITIZA) 8 MCG capsule Take 8 mcg by mouth 2 (two) times daily with a meal.       . methocarbamol (ROBAXIN) 500 MG tablet Take 500 mg by mouth 2 (two) times daily.        . niacin 250 MG tablet Take 250 mg by mouth daily.        . pantoprazole (PROTONIX) 40 MG tablet Take 40 mg by mouth daily.        Marland Kitchen zolpidem (AMBIEN) 10 MG tablet Take 5 mg by mouth at bedtime as needed. For sleep        Allergies  Allergen Reactions  . Demerol     unknown  . Hydrocodone-Acetaminophen     unknown  . Meperidine Hcl     unknown  . Morphine     unknown  . Morphine And Related     unknown  . Vioxx  (Rofecoxib)     unknown    Past Medical History  Diagnosis Date  . Bundle branch block left   . Left ventricular dysfunction   . Dyslipidemia   . IBS (irritable bowel syndrome)   . GERD (gastroesophageal reflux disease)   . Fibromyalgia   . Degenerative joint disease   . Cancer     Breast  . Gastritis     Past Surgical History  Procedure Date  . Cardiac catheterization 08/2008  . Mastectomy, partial     Status post left   . Tonsillectomy   . Ganglion cyst excision     History  Smoking status  . Never Smoker   Smokeless tobacco  . Not on file    History  Alcohol Use No    Family History  Problem Relation Age of Onset  . Heart disease Mother   . Heart disease Sister  Review of Systems: As noted history of present illness.  All other systems were reviewed and are negative.  Physical Exam: BP 124/70  Pulse 72  Ht 5\' 3"  (1.6 m)  Wt 169 lb (76.658 kg)  BMI 29.94 kg/m2 The patient is alert and oriented x 3.  The mood and affect are normal.  The skin is warm and dry.  Color is normal.  The HEENT exam reveals that the sclera are nonicteric.  The mucous membranes are moist.  The carotids are 2+ without bruits.  There is no thyromegaly.  There is no JVD.  The lungs are clear.  The chest wall is non tender.  The heart exam reveals a regular rate with a normal S1 and S2.  There are no murmurs, gallops, or rubs.  The PMI is not displaced.   Abdominal exam reveals good bowel sounds.  There is no guarding or rebound.  There is no hepatosplenomegaly or tenderness.  There are no masses.  Exam of the legs reveal no clubbing, cyanosis, or edema.  The legs are without rashes.  The distal pulses are intact.  Cranial nerves II - XII are intact.  Motor and sensory functions are intact.  The gait is normal. LABORATORY DATA: ECG today demonstrates normal sinus rhythm with a chronic left bundle branch block.  Assessment / Plan:

## 2011-09-04 NOTE — Assessment & Plan Note (Signed)
Ejection fraction 45-50% by echocardiogram in March of 2011. No signs or symptoms of congestive heart failure.

## 2011-09-09 HISTORY — PX: CATARACT EXTRACTION: SUR2

## 2011-10-15 ENCOUNTER — Encounter: Payer: Self-pay | Admitting: Family Medicine

## 2011-10-15 ENCOUNTER — Ambulatory Visit (INDEPENDENT_AMBULATORY_CARE_PROVIDER_SITE_OTHER): Payer: Medicare Other | Admitting: Family Medicine

## 2011-10-15 VITALS — BP 121/77 | HR 80 | Temp 98.3°F | Ht 61.75 in | Wt 170.0 lb

## 2011-10-15 DIAGNOSIS — IMO0001 Reserved for inherently not codable concepts without codable children: Secondary | ICD-10-CM

## 2011-10-15 DIAGNOSIS — K589 Irritable bowel syndrome without diarrhea: Secondary | ICD-10-CM | POA: Insufficient documentation

## 2011-10-15 DIAGNOSIS — E785 Hyperlipidemia, unspecified: Secondary | ICD-10-CM

## 2011-10-15 DIAGNOSIS — M797 Fibromyalgia: Secondary | ICD-10-CM

## 2011-10-15 DIAGNOSIS — R000 Tachycardia, unspecified: Secondary | ICD-10-CM

## 2011-10-15 DIAGNOSIS — K219 Gastro-esophageal reflux disease without esophagitis: Secondary | ICD-10-CM

## 2011-10-15 NOTE — Progress Notes (Signed)
  Subjective:    Patient ID: Kristina Schroeder, female    DOB: [birth year], 71 y.o.   MRN: [id number]  HPI New to establish.  Previous MD- Uvaldo Rising Deboraha Sprang) Cards- [place] [facility]- [facility]- [facility]- [facility]- [facility]- Deveshwar  Hyperlipidemia- chronic problem, on crestor and Zetia.  Checked 2 months ago.  Well controlled.  Denies N/V, myalgias.  Tachycardia- chronic problem, following w/ Dr [place].  On Dilt, Nadolol.  GERD- chronic problem, on Protonix.  Feels sxs are well controlled.  IBS- chronic problem, Levsin and Amitiza.  Following w/ GI  Fibromyalgia- chronic problem, 35 yrs+.  on Cymbalta, neurontin, robaxin.  Still continues to be problematic for pt.  Wants to know if there are better meds available.  Breast Cancer- getting yearly mammograms w/ Dr Nicholas Lose and is due in May.  10 yrs cancer free.   Review of Systems For ROS see HPI     Objective:   Physical Exam  Vitals reviewed. Constitutional: She is oriented to person, place, and time. She appears well-developed and well-nourished. No distress.  HENT:  Head: Normocephalic and atraumatic.  Eyes: Conjunctivae and EOM are normal. Pupils are equal, round, and reactive to light.  Neck: Normal range of motion. Neck supple. No thyromegaly present.  Cardiovascular: Normal rate, regular rhythm, normal heart sounds and intact distal pulses.   No murmur heard. Pulmonary/Chest: Effort normal and breath sounds normal. No respiratory distress.  Abdominal: Soft. She exhibits no distension. There is no tenderness.  Musculoskeletal: She exhibits no edema.  Lymphadenopathy:    She has no cervical adenopathy.  Neurological: She is alert and oriented to person, place, and time.  Skin: Skin is warm and dry.  Psychiatric: She has a normal mood and affect. Her behavior is normal.          Assessment & Plan:

## 2011-10-15 NOTE — Patient Instructions (Signed)
Please have your specialists send [place] their notes so we can follow along Try and get regular exercise to improve your fibromyalgia pain At this point, we won't make any med changes Call with any questions or concerns Welcome!  We're glad to have you!

## 2011-10-26 NOTE — Assessment & Plan Note (Signed)
New to provider- chronic for pt.  Following w/ GI.  On meds.  Doing fairly well.  Will continue to follow.

## 2011-10-26 NOTE — Assessment & Plan Note (Signed)
New to provider.  Following w/ cardiology.  Will defer management to them and follow along.

## 2011-10-26 NOTE — Assessment & Plan Note (Signed)
New to provider.  Chronic for pt.  Had labs done 2 months ago.  Tolerating statin w/out difficulty.  No changes.  Will follow.

## 2011-10-26 NOTE — Assessment & Plan Note (Signed)
New to provider- chronic for pt.  Following w/ rheum.  Pt already on appropriate meds.  Encouraged regular exercise to prevent stiffness and improve pain.  Will follow.

## 2011-10-26 NOTE — Assessment & Plan Note (Signed)
New to provider, chronic for pt.  sxs well controlled on current PPI.  No changes.

## 2011-10-31 ENCOUNTER — Ambulatory Visit (INDEPENDENT_AMBULATORY_CARE_PROVIDER_SITE_OTHER): Payer: Medicare Other | Admitting: Family Medicine

## 2011-10-31 ENCOUNTER — Encounter: Payer: Self-pay | Admitting: Family Medicine

## 2011-10-31 VITALS — BP 125/78 | HR 88 | Temp 98.3°F | Ht 61.75 in | Wt 169.6 lb

## 2011-10-31 DIAGNOSIS — J019 Acute sinusitis, unspecified: Secondary | ICD-10-CM

## 2011-10-31 MED ORDER — CETIRIZINE HCL 10 MG PO TABS: 10.0000 mg | ORAL_TABLET | Freq: Every day | ORAL | Status: AC

## 2011-10-31 MED ORDER — GUAIFENESIN-CODEINE 100-10 MG/5ML PO SYRP: ORAL_SOLUTION | ORAL | Status: DC

## 2011-10-31 MED ORDER — AMOXICILLIN 875 MG PO TABS: 875.0000 mg | ORAL_TABLET | Freq: Two times a day (BID) | ORAL | Status: DC

## 2011-10-31 NOTE — Assessment & Plan Note (Signed)
New.  Pt's sxs and PE consistent w/ infxn.  Start abx.  Cough meds prn.  Reviewed supportive care and red flags that should prompt return.  Pt expressed understanding and is in agreement w/ plan.  

## 2011-10-31 NOTE — Progress Notes (Signed)
  Subjective:    Patient ID: Kristina Schroeder, female    DOB: May 02, 1941, 71 y.o.   MRN: [id number]  HPI URI- sxs started Tuesday w/ nasal congestion, PND, cough productive of green sputum.  + sick contacts.  + facial pain over R eye.  No fevers.   Review of Systems For ROS see HPI     Objective:   Physical Exam  Vitals reviewed. Constitutional: She appears well-developed and well-nourished. No distress.  HENT:  Head: Normocephalic and atraumatic.  Right Ear: Tympanic membrane normal.  Left Ear: Tympanic membrane normal.  Nose: Mucosal edema and rhinorrhea present. Right sinus exhibits maxillary sinus tenderness and frontal sinus tenderness. Left sinus exhibits no maxillary sinus tenderness and no frontal sinus tenderness.  Mouth/Throat: Uvula is midline and mucous membranes are normal. Posterior oropharyngeal erythema present. No oropharyngeal exudate.  Eyes: Conjunctivae and EOM are normal. Pupils are equal, round, and reactive to light.  Neck: Normal range of motion. Neck supple.  Cardiovascular: Normal rate, regular rhythm and normal heart sounds.   Pulmonary/Chest: Effort normal and breath sounds normal. No respiratory distress. She has no wheezes.  Lymphadenopathy:    She has no cervical adenopathy.          Assessment & Plan:

## 2011-10-31 NOTE — Patient Instructions (Signed)
Start the Amoxicillin twice daily w/ food to treat your sinus infection Use the cough syrup as needed Start the Zyrtec daily for allergy symptoms Call with any questions or concerns Hang in there!!!

## 2011-11-25 ENCOUNTER — Other Ambulatory Visit: Payer: Self-pay | Admitting: Gynecology

## 2011-11-25 ENCOUNTER — Other Ambulatory Visit: Payer: Self-pay | Admitting: Cardiology

## 2011-11-25 DIAGNOSIS — Z1231 Encounter for screening mammogram for malignant neoplasm of breast: Secondary | ICD-10-CM

## 2011-11-26 ENCOUNTER — Encounter: Payer: Self-pay | Admitting: Family Medicine

## 2011-11-26 ENCOUNTER — Ambulatory Visit (INDEPENDENT_AMBULATORY_CARE_PROVIDER_SITE_OTHER): Payer: Medicare Other | Admitting: Family Medicine

## 2011-11-26 VITALS — BP 118/80 | HR 81 | Temp 98.4°F | Ht 61.0 in | Wt 167.0 lb

## 2011-11-26 DIAGNOSIS — J019 Acute sinusitis, unspecified: Secondary | ICD-10-CM

## 2011-11-26 MED ORDER — AMOXICILLIN 875 MG PO TABS: 875.0000 mg | ORAL_TABLET | Freq: Two times a day (BID) | ORAL | Status: AC

## 2011-11-26 NOTE — Assessment & Plan Note (Signed)
Pt's sxs and PE consistent w/ infxn.  Start abx.  Reviewed supportive care and red flags that should prompt return.  Pt expressed understanding and is in agreement w/ plan.  

## 2011-11-26 NOTE — Progress Notes (Signed)
  Subjective:    Patient ID: Kristina Schroeder, female    DOB: 1941/05/30, 71 y.o.   MRN: [id number]  HPI 'my sinuses are about to drive me crazy'- sxs started over the weekend w/ sore throat, 'constant' PND.  R ear pain.  No fevers.  + facial pressure.  Minimal cough but frequent throat clearing.  No known sick contacts.  Take Zyrtec daily.   Review of Systems For ROS see HPI     Objective:   Physical Exam  Vitals reviewed. Constitutional: She appears well-developed and well-nourished. No distress.  HENT:  Head: Normocephalic and atraumatic.  Right Ear: Tympanic membrane normal.  Left Ear: Tympanic membrane normal.  Nose: Mucosal edema and rhinorrhea present. Right sinus exhibits maxillary sinus tenderness and frontal sinus tenderness. Left sinus exhibits maxillary sinus tenderness and frontal sinus tenderness.  Mouth/Throat: Uvula is midline and mucous membranes are normal. Posterior oropharyngeal erythema present. No oropharyngeal exudate.  Eyes: Conjunctivae and EOM are normal. Pupils are equal, round, and reactive to light.  Neck: Normal range of motion. Neck supple.  Cardiovascular: Normal rate, regular rhythm and normal heart sounds.   Pulmonary/Chest: Effort normal and breath sounds normal. No respiratory distress. She has no wheezes.  Lymphadenopathy:    She has no cervical adenopathy.          Assessment & Plan:

## 2011-11-26 NOTE — Patient Instructions (Addendum)
Take the Amoxicillin twice daily for early sinus infection Drink plenty of fluids Mucinex as needed to thin your post nasal drip Rest! Sherri Rad in there!!!

## 2011-12-18 ENCOUNTER — Ambulatory Visit
Admission: RE | Admit: 2011-12-18 | Discharge: 2011-12-18 | Disposition: A | Discharge status: Home / Self Care | Payer: Medicare Other | Source: Ambulatory Visit | Attending: Gynecology | Admitting: Gynecology

## 2011-12-18 DIAGNOSIS — Z1231 Encounter for screening mammogram for malignant neoplasm of breast: Secondary | ICD-10-CM

## 2012-01-22 ENCOUNTER — Other Ambulatory Visit: Payer: Self-pay | Admitting: Cardiology

## 2012-01-23 ENCOUNTER — Other Ambulatory Visit: Payer: Self-pay | Admitting: *Deleted

## 2012-01-24 ENCOUNTER — Other Ambulatory Visit: Payer: Self-pay

## 2012-01-24 ENCOUNTER — Telehealth: Payer: Self-pay | Admitting: Cardiology

## 2012-01-24 MED ORDER — NADOLOL 40 MG PO TABS: 40.0000 mg | ORAL_TABLET | Freq: Every day | ORAL | Status: DC

## 2012-01-24 NOTE — Telephone Encounter (Signed)
Patient called upset because her corgard has not been renewed.Stated she has been waiting on prescription for 3 days.Patient was told it was not listed on her medication list.Stated at one time her pharmacy could not get corgard and she was prescribed metoprolol.States when pharmacy got corgard in she stopped metoprolol and went back to taking corgard 20 mg daily.Patient was told will call [facility] and renew corgard.Spoke to pharmacist at [facility] was told to renew corgard 20 mg daily # 30 refills x 6.

## 2012-01-27 ENCOUNTER — Other Ambulatory Visit: Payer: Self-pay | Admitting: Cardiology

## 2012-02-03 ENCOUNTER — Other Ambulatory Visit: Payer: Self-pay | Admitting: *Deleted

## 2012-02-03 NOTE — Telephone Encounter (Signed)
Ok for cymbalta #60, 6 refills Ok for Ambien, #30, 3 refills

## 2012-02-04 MED ORDER — DULOXETINE HCL 60 MG PO CPEP: 60.0000 mg | ORAL_CAPSULE | Freq: Two times a day (BID) | ORAL | Status: AC

## 2012-02-04 MED ORDER — ZOLPIDEM TARTRATE 10 MG PO TABS: 5.0000 mg | ORAL_TABLET | Freq: Every evening | ORAL | Status: AC | PRN

## 2012-02-04 NOTE — Telephone Encounter (Signed)
.  rx faxed to pharmacy, manually for Ambein escribe cymbalta

## 2012-03-02 ENCOUNTER — Encounter: Payer: Self-pay | Admitting: Family

## 2012-03-02 ENCOUNTER — Ambulatory Visit (INDEPENDENT_AMBULATORY_CARE_PROVIDER_SITE_OTHER): Payer: Medicare Other | Admitting: Family

## 2012-03-02 VITALS — BP 126/86 | HR 73 | Temp 98.3°F | Resp 16 | Wt 164.1 lb

## 2012-03-02 DIAGNOSIS — J40 Bronchitis, not specified as acute or chronic: Secondary | ICD-10-CM | POA: Insufficient documentation

## 2012-03-02 MED ORDER — AZITHROMYCIN 250 MG PO TABS: ORAL_TABLET | ORAL | Status: DC

## 2012-03-02 MED ORDER — BENZONATATE 100 MG PO CAPS: 100.0000 mg | ORAL_CAPSULE | Freq: Three times a day (TID) | ORAL | Status: AC | PRN

## 2012-03-02 NOTE — Progress Notes (Signed)
Subjective:    Patient ID: Kristina Schroeder, female    DOB: 01-Oct-1940, 71 y.o.   MRN: [id number]  HPI  Kristina Schroeder is a 71 yr old female who presents with complaint of cough since Friday.  Cough is associated with muscle soreness and productive of yellow sputum. She denies fever and has been continuing mucinex and zyrtec.  Allergies have been acting up.    Review of Systems See HPI  Past Medical History  Diagnosis Date  . Bundle branch block left   . Left ventricular dysfunction   . Dyslipidemia   . IBS (irritable bowel syndrome)   . GERD (gastroesophageal reflux disease)   . Fibromyalgia   . Degenerative joint disease   . Cancer     Breast  . Gastritis   . Bronchitis     acute  . Allergy   . 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 Hyperlipidemia     History   Social History  . Marital Status: Married    Spouse Name: N/A    Number of Children: 1  . Years of Education: N/A   Occupational History  . Not on file.   Social History Main Topics  . Smoking status: Never Smoker   . Smokeless tobacco: Not on file  . Alcohol Use: No  . Drug Use: Not on file  . Sexually Active: Not on file   Other Topics Concern  . Not on file   Social History Narrative  . No narrative on file    Past Surgical History  Procedure Date  . Cardiac catheterization 08/2008  . Mastectomy, partial     Status post left   . Tonsillectomy   . Ganglion cyst excision   . Cataract extraction 4 2013  . Breast surgery 12 6 2003  . Tonsillectomy 1963    Family History  Problem Relation Age of Onset  . Heart disease Mother   . Heart disease Sister   . Diabetes Sister   . Arthritis Father   . Cancer Father   . Crohn's disease Father   . Cancer Paternal Aunt     Allergies  Allergen Reactions  . Demerol     unknown  . Hydrocodone-Acetaminophen     unknown  . Meperidine Hcl     unknown  . Morphine     unknown  . Morphine And Related     unknown  . Vioxx  (Rofecoxib)     unknown    Current Outpatient Prescriptions on File Prior to Visit  Medication Sig Dispense Refill  . BIOTIN 5000 PO Take 5,000 mg by mouth daily.       Marland Kitchen CARDIZEM CD 180 MG 24 hr capsule TAKE 2 CAPSULES DAILY.  180 each  1  . cetirizine (ZYRTEC) 10 MG tablet Take 1 tablet (10 mg total) by mouth daily.  30 tablet  11  . cholecalciferol (VITAMIN D) 1000 UNITS tablet Take 2,000 Units by mouth daily.        . CRESTOR 5 MG tablet TAKE 1 TABLET ONCE DAILY.  30 each  11  . DULoxetine (CYMBALTA) 60 MG capsule Take 1 capsule (60 mg total) by mouth 2 (two) times daily.  60 capsule  6  . ezetimibe (ZETIA) 10 MG tablet Take 1 tablet (10 mg total) by mouth daily. Take 1/2 Tab  90 tablet  3  . gabapentin (NEURONTIN) 100 MG capsule Take 100 mg by mouth at bedtime.       Marland Kitchen  guaiFENesin (MUCINEX) 600 MG 12 hr tablet Take 1 tablet (600 mg total) by mouth 2 (two) times daily.  60 tablet  2  . hyoscyamine (LEVSIN) 0.125 MG/5ML ELIX Take 0.125 mg by mouth every 4 (four) hours as needed. For IBS      . lubiprostone (AMITIZA) 8 MCG capsule Take 8 mcg by mouth 2 (two) times daily with a meal.       . methocarbamol (ROBAXIN) 500 MG tablet Take 500 mg by mouth 2 (two) times daily.        . nadolol (CORGARD) 40 MG tablet Take 1 tablet (40 mg total) by mouth daily. 1/2 daily  30 tablet  6  . niacin 250 MG tablet Take 250 mg by mouth daily.        . pantoprazole (PROTONIX) 40 MG tablet Take 40 mg by mouth daily.        Marland Kitchen zolpidem (AMBIEN) 10 MG tablet Take 0.5 tablets (5 mg total) by mouth at bedtime as needed. For sleep  30 tablet  3  . DISCONTD: metoprolol (TOPROL XL) 50 MG 24 hr tablet Take 1 tablet (50 mg total) by mouth daily.  30 tablet  11    BP 126/86  Pulse 73  Temp 98.3 F (36.8 C) (Oral)  Resp 16  Wt 164 lb 1.9 oz (74.444 kg)  SpO2 97%       Objective:   Physical Exam  Constitutional: She is oriented to person, place, and time. She appears well-developed and well-nourished. No  distress.  Cardiovascular: Normal rate and regular rhythm.   No murmur heard. Pulmonary/Chest: Effort normal and breath sounds normal. No respiratory distress. She has no wheezes.  Neurological: She is alert and oriented to person, place, and time.  Skin: Skin is warm and dry.  Psychiatric: She has a normal mood and affect. Her behavior is normal. Judgment and thought content normal.          Assessment & Plan:

## 2012-03-02 NOTE — Patient Instructions (Addendum)

## 2012-03-02 NOTE — Assessment & Plan Note (Signed)
Rx with Zpak and prn tessalon.

## 2012-03-12 ENCOUNTER — Encounter: Payer: Self-pay | Admitting: Family Medicine

## 2012-03-12 ENCOUNTER — Ambulatory Visit (INDEPENDENT_AMBULATORY_CARE_PROVIDER_SITE_OTHER): Payer: Medicare Other | Admitting: Family Medicine

## 2012-03-12 VITALS — BP 115/80 | HR 71 | Temp 98.4°F | Ht 61.0 in | Wt 166.6 lb

## 2012-03-12 DIAGNOSIS — J40 Bronchitis, not specified as acute or chronic: Secondary | ICD-10-CM

## 2012-03-12 MED ORDER — GUAIFENESIN-CODEINE 100-10 MG/5ML PO SYRP: ORAL_SOLUTION | ORAL | Status: DC

## 2012-03-12 MED ORDER — AZITHROMYCIN 250 MG PO TABS: ORAL_TABLET | ORAL | Status: DC

## 2012-03-12 NOTE — Progress Notes (Signed)
  Subjective:    Patient ID: Kristina Schroeder, female    DOB: [birth year], 71 y.o.   MRN: [id number]  HPI URI- saw Melissa on 9/23 and was started on Zpack.  Reports she finished meds 1 week ago.  Still coughing and chest congestion.  Still having yellow sputum.  No fevers.  + nasal congestion.  Has been using nasonex.  No ear pain.  No facial pain/pressure.  + wheezing.  No known sick contacts.  Hx of similar sxs that have required multiple Zpack or other abx in the past.   Review of Systems For ROS see HPI     Objective:   Physical Exam  Vitals reviewed. Constitutional: She appears well-developed and well-nourished. No distress.  HENT:  Head: Normocephalic and atraumatic.       TMs normal bilaterally Mild nasal congestion Throat w/out erythema, edema, or exudate  Eyes: Conjunctivae normal and EOM are normal. Pupils are equal, round, and reactive to light.  Neck: Normal range of motion. Neck supple.  Cardiovascular: Normal rate, regular rhythm, normal heart sounds and intact distal pulses.   No murmur heard. Pulmonary/Chest: Effort normal and breath sounds normal. No respiratory distress. She has no wheezes.       + hacking cough  Lymphadenopathy:    She has no cervical adenopathy.          Assessment & Plan:

## 2012-03-12 NOTE — Patient Instructions (Addendum)
Repeat the Zpack Use the cough syrup as needed Add Mucinex to thin your chest congestion Drink plenty of fluids REST! Hang in there!!!

## 2012-03-12 NOTE — Assessment & Plan Note (Signed)
New to provider.  Recurrent for pt.  Will repeat Zpack.  No need for steroids, no current wheezing.  Cough syrup prn.  Reviewed supportive care and red flags that should prompt return.  Pt expressed understanding and is in agreement w/ plan.

## 2012-03-20 ENCOUNTER — Other Ambulatory Visit: Payer: Self-pay | Admitting: Cardiology

## 2012-03-20 NOTE — Telephone Encounter (Signed)
Fax Received. Refill Completed. Adelaida Reindel Chowoe (R.M.A)   

## 2012-05-22 ENCOUNTER — Other Ambulatory Visit: Payer: Self-pay | Admitting: Family Medicine

## 2012-05-22 NOTE — Telephone Encounter (Signed)
LM @ (10:53am) asking the pt to RTC.//AB/CMA 

## 2012-05-22 NOTE — Telephone Encounter (Signed)
Refills x 2 Fluticasone & Zetia -- last ov 10.3.13 acute -- last labs 3.27.13  1-Fluticasone Prop 50 MCG #16GM Use 2 sprays in each nostril once a day last fill 6.17.13  2-Zetia 10mg  Tablet #15 Take 1/2 tablet daily last fil 10.11.13

## 2012-05-25 ENCOUNTER — Telehealth: Payer: Self-pay | Admitting: Family Medicine

## 2012-05-25 ENCOUNTER — Other Ambulatory Visit: Payer: Self-pay | Admitting: *Deleted

## 2012-05-25 NOTE — Telephone Encounter (Signed)
Noted.  We will not be able to fill any meds if pt is no longer coming here.

## 2012-05-25 NOTE — Telephone Encounter (Signed)
pt called in stated she is consolidating providers and will no longer be coming to see Dr Beverely Low

## 2012-05-27 ENCOUNTER — Other Ambulatory Visit: Payer: Self-pay | Admitting: Cardiology

## 2012-05-27 MED ORDER — DILTIAZEM HCL ER COATED BEADS 180 MG PO CP24: 360.0000 mg | ORAL_CAPSULE | Freq: Every day | ORAL | Status: DC

## 2012-05-29 ENCOUNTER — Other Ambulatory Visit: Payer: Self-pay

## 2012-05-29 MED ORDER — EZETIMIBE 10 MG PO TABS: 10.0000 mg | ORAL_TABLET | Freq: Every day | ORAL | Status: DC

## 2012-08-06 ENCOUNTER — Other Ambulatory Visit: Payer: Self-pay | Admitting: Gynecology

## 2012-08-25 ENCOUNTER — Other Ambulatory Visit: Payer: Self-pay | Admitting: Cardiology

## 2012-08-27 NOTE — Telephone Encounter (Signed)
Spoke to patient she stated she takes nadolol 20 mg daily.Appointment scheduled with Dr.Jordan 10/21/12.

## 2012-09-23 ENCOUNTER — Telehealth: Payer: Self-pay | Admitting: Family Medicine

## 2012-09-23 NOTE — Telephone Encounter (Signed)
Refill: Duloxetine hcl dr 60 mg. Take 1 capsule twice daily. Qty 60. Last fill 08-25-12

## 2012-09-23 NOTE — Telephone Encounter (Signed)
Looking through notes in chart, the patient contacted our office and stated that "she was consolidating physicians and would no longer be seeing Dr. Beverely Low". Since the patient is no longer coming here, we can no longer prescribed medications to her. I have contacted [facility] and informed them that pt is no longer seeing Dr. Beverely Low and that we would no longer fill meds.

## 2012-10-20 ENCOUNTER — Telehealth: Payer: Self-pay | Admitting: Cardiology

## 2012-10-20 NOTE — Telephone Encounter (Signed)
New Problem:   Patient called in stating that her PCP has all of her labs results form her recent visit and that our office needs to call their office to request those records Dr. Selena Batten phone- [phone].  Please call back.

## 2012-10-21 ENCOUNTER — Ambulatory Visit (INDEPENDENT_AMBULATORY_CARE_PROVIDER_SITE_OTHER): Payer: Medicare Other | Admitting: Cardiology

## 2012-10-21 ENCOUNTER — Encounter: Payer: Self-pay | Admitting: Cardiology

## 2012-10-21 VITALS — BP 145/81 | HR 82 | Ht 61.0 in | Wt 171.8 lb

## 2012-10-21 DIAGNOSIS — I2510 Atherosclerotic heart disease of native coronary artery without angina pectoris: Secondary | ICD-10-CM

## 2012-10-21 DIAGNOSIS — E7800 Pure hypercholesterolemia, unspecified: Secondary | ICD-10-CM

## 2012-10-21 DIAGNOSIS — I4460 Unspecified fascicular block: Secondary | ICD-10-CM

## 2012-10-21 DIAGNOSIS — R000 Tachycardia, unspecified: Secondary | ICD-10-CM

## 2012-10-21 MED ORDER — ROSUVASTATIN CALCIUM 5 MG PO TABS: 5.0000 mg | ORAL_TABLET | Freq: Every day | ORAL | Status: DC

## 2012-10-21 MED ORDER — EZETIMIBE 10 MG PO TABS: 10.0000 mg | ORAL_TABLET | Freq: Every day | ORAL | Status: DC

## 2012-10-21 NOTE — Progress Notes (Signed)
Kristina Schroeder Date of Birth: 15-Apr-1941   History of Present Illness: Kristina Schroeder is seen today for yearly followup. She has a history of inappropriate sinus tachycardia. She has mild left ventricular dysfunction probably related to a chronic left bundle branch block. Her tachycardia has been regulated with beta blocker and diltiazem therapy. She has done very well this past year without any chest pain, shortness of breath, palpitations, or dizziness. She remains active.  Current Outpatient Prescriptions on File Prior to Visit  Medication Sig Dispense Refill  . BIOTIN 5000 PO Take 5,000 mg by mouth daily.       . cetirizine (ZYRTEC) 10 MG tablet Take 1 tablet (10 mg total) by mouth daily.  30 tablet  11  . cholecalciferol (VITAMIN D) 1000 UNITS tablet Take 2,000 Units by mouth daily.        Marland Kitchen diltiazem (CARDIZEM CD) 180 MG 24 hr capsule Take 2 capsules (360 mg total) by mouth daily.  180 capsule  1  . DULoxetine (CYMBALTA) 60 MG capsule Take 1 capsule (60 mg total) by mouth 2 (two) times daily.  60 capsule  6  . gabapentin (NEURONTIN) 100 MG capsule Take 100 mg by mouth at bedtime.       Marland Kitchen lubiprostone (AMITIZA) 8 MCG capsule Take 8 mcg by mouth 2 (two) times daily with a meal.       . methocarbamol (ROBAXIN) 500 MG tablet Take 500 mg by mouth 2 (two) times daily.       . nadolol (CORGARD) 20 MG tablet TAKE 1 TABLET ONCE DAILY.  30 tablet  6  . pantoprazole (PROTONIX) 40 MG tablet Take 40 mg by mouth daily.        Marland Kitchen zolpidem (AMBIEN) 10 MG tablet Take 0.5 tablets (5 mg total) by mouth at bedtime as needed. For sleep  30 tablet  3  . [DISCONTINUED] metoprolol (TOPROL XL) 50 MG 24 hr tablet Take 1 tablet (50 mg total) by mouth daily.  30 tablet  11   No current facility-administered medications on file prior to visit.    Allergies  Allergen Reactions  . Demerol     unknown  . Hydrocodone-Acetaminophen     unknown  . Meperidine Hcl     unknown  . Morphine     unknown  . Morphine And  Related     unknown  . Vioxx (Rofecoxib)     unknown    Past Medical History  Diagnosis Date  . Bundle branch block left   . Left ventricular dysfunction   . Dyslipidemia   . IBS (irritable bowel syndrome)   . GERD (gastroesophageal reflux disease)   . Fibromyalgia   . Degenerative joint disease   . Cancer     Breast  . Gastritis   . Bronchitis     acute  . Allergy   . 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 Hyperlipidemia     Past Surgical History  Procedure Laterality Date  . Cardiac catheterization  08/2008  . Mastectomy, partial      Status post left   . Tonsillectomy    . Ganglion cyst excision    . Cataract extraction  4 2013  . Breast surgery  12 6 2003  . Tonsillectomy  1963    History  Smoking status  . Never Smoker   Smokeless tobacco  . Not on file    History  Alcohol Use No    Family History  Problem Relation Age of Onset  . Heart disease Mother   . Heart disease Sister   . Diabetes Sister   . Arthritis Father   . Cancer Father   . Crohn's disease Father   . Cancer Paternal Aunt     Review of Systems: As noted history of present illness.  All other systems were reviewed and are negative.  Physical Exam: BP 145/81  Pulse 82  Ht 5\' 1"  (1.549 m)  Wt 171 lb 12.8 oz (77.928 kg)  BMI 32.48 kg/m2  SpO2 96% The patient is alert and oriented x 3.  She appears younger than her stated age.  The HEENT exam is normal. The carotids are 2+ without bruits.  There is no thyromegaly.  There is no JVD.  The lungs are clear.    The heart exam reveals a regular rate with a normal S1 and S2.  There are no murmurs, gallops, or rubs.  The PMI is not displaced.   Abdominal exam reveals good bowel sounds.    Exam of the legs reveal no clubbing, cyanosis, or edema.  The legs are without rashes.  The distal pulses are intact.  Cranial nerves II - XII are intact.  Motor and sensory functions are intact.  The gait is normal. LABORATORY DATA: ECG  today demonstrates normal sinus rhythm with a chronic left bundle branch block.  Assessment / Plan: 1. Left bundle branch block-chronic  2. Mild left ventricular dysfunction with ejection fraction of 45-50%. Probably  related to her left bundle branch block. Asymptomatic.   3. 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  4. Hyperlipidemia.

## 2012-10-21 NOTE — Patient Instructions (Signed)
Continue your current therapy  We will request your lab work from Dr. Corliss Blacker  I will see you in 1 year.

## 2012-10-21 NOTE — Telephone Encounter (Signed)
Patient had appointment today 10/21/12 with Dr.Jordan lab requested from Dr.McNeil's office.

## 2012-11-05 ENCOUNTER — Encounter: Payer: Self-pay | Admitting: Cardiology

## 2012-11-26 ENCOUNTER — Other Ambulatory Visit: Payer: Self-pay

## 2012-11-26 MED ORDER — DILTIAZEM HCL ER COATED BEADS 180 MG PO CP24: 360.0000 mg | ORAL_CAPSULE | Freq: Every day | ORAL | Status: DC

## 2012-11-26 NOTE — Telephone Encounter (Signed)
Peter M [place], MD at 10/21/2012 10:54 AM diltiazem (CARDIZEM CD) 180 MG 24 hr capsule  Take 2 capsules (360 mg total) by mouth daily.   180 capsule   Patient Instructions  Continue your current therapy We will request your lab work from Dr. Corliss Blacker I will see you in 1 year.

## 2013-02-09 ENCOUNTER — Other Ambulatory Visit: Payer: Self-pay

## 2013-02-09 DIAGNOSIS — Z853 Personal history of malignant neoplasm of breast: Secondary | ICD-10-CM

## 2013-02-09 DIAGNOSIS — Z1231 Encounter for screening mammogram for malignant neoplasm of breast: Secondary | ICD-10-CM

## 2013-03-01 ENCOUNTER — Encounter (INDEPENDENT_AMBULATORY_CARE_PROVIDER_SITE_OTHER): Payer: Medicare Other | Admitting: Ophthalmology

## 2013-03-01 DIAGNOSIS — H35349 Macular cyst, hole, or pseudohole, unspecified eye: Secondary | ICD-10-CM

## 2013-03-08 ENCOUNTER — Ambulatory Visit
Admission: RE | Admit: 2013-03-08 | Discharge: 2013-03-08 | Disposition: A | Discharge status: Home / Self Care | Payer: Medicare Other | Source: Ambulatory Visit

## 2013-03-08 DIAGNOSIS — Z853 Personal history of malignant neoplasm of breast: Secondary | ICD-10-CM

## 2013-03-08 DIAGNOSIS — Z1231 Encounter for screening mammogram for malignant neoplasm of breast: Secondary | ICD-10-CM

## 2013-03-30 ENCOUNTER — Other Ambulatory Visit: Payer: Self-pay | Admitting: Cardiology

## 2013-04-07 ENCOUNTER — Other Ambulatory Visit: Payer: Self-pay | Admitting: Cardiology

## 2013-05-30 ENCOUNTER — Other Ambulatory Visit: Payer: Self-pay | Admitting: Cardiology

## 2013-05-31 ENCOUNTER — Other Ambulatory Visit: Payer: Self-pay | Admitting: Cardiology

## 2013-08-04 ENCOUNTER — Other Ambulatory Visit: Payer: Self-pay | Admitting: Cardiology

## 2013-09-06 ENCOUNTER — Ambulatory Visit (INDEPENDENT_AMBULATORY_CARE_PROVIDER_SITE_OTHER): Payer: Medicare Other | Admitting: Ophthalmology

## 2013-09-06 DIAGNOSIS — H43819 Vitreous degeneration, unspecified eye: Secondary | ICD-10-CM

## 2013-09-06 DIAGNOSIS — H35349 Macular cyst, hole, or pseudohole, unspecified eye: Secondary | ICD-10-CM

## 2013-11-08 ENCOUNTER — Other Ambulatory Visit: Payer: Self-pay | Admitting: Cardiology

## 2013-11-25 ENCOUNTER — Other Ambulatory Visit: Payer: Self-pay | Admitting: Cardiology

## 2013-12-03 ENCOUNTER — Other Ambulatory Visit: Payer: Self-pay | Admitting: Cardiology

## 2013-12-05 ENCOUNTER — Other Ambulatory Visit: Payer: Self-pay | Admitting: Cardiology

## 2013-12-12 ENCOUNTER — Other Ambulatory Visit: Payer: Self-pay | Admitting: Cardiology

## 2013-12-16 NOTE — Telephone Encounter (Signed)
Received  message patient needs refill for crestor.Spoke to [place] he advised ok to refill.Advised to obtain lipid and liver panels from PCP.PCP called lipid and liver panels requested.Patient has appointment with Dr.Jordan 01/26/14.

## 2013-12-23 ENCOUNTER — Other Ambulatory Visit: Payer: Self-pay | Admitting: Cardiology

## 2014-01-08 ENCOUNTER — Other Ambulatory Visit: Payer: Self-pay | Admitting: Cardiology

## 2014-01-16 ENCOUNTER — Encounter (HOSPITAL_BASED_OUTPATIENT_CLINIC_OR_DEPARTMENT_OTHER): Payer: Self-pay | Admitting: Emergency Medicine

## 2014-01-16 ENCOUNTER — Emergency Department (HOSPITAL_BASED_OUTPATIENT_CLINIC_OR_DEPARTMENT_OTHER)
Admission: EM | Admit: 2014-01-16 | Discharge: 2014-01-16 | Disposition: A | Discharge status: Home / Self Care | Payer: Medicare Other | Attending: Emergency Medicine | Admitting: Emergency Medicine

## 2014-01-16 DIAGNOSIS — Z8739 Personal history of other diseases of the musculoskeletal system and connective tissue: Secondary | ICD-10-CM | POA: Insufficient documentation

## 2014-01-16 DIAGNOSIS — H00016 Hordeolum externum left eye, unspecified eyelid: Secondary | ICD-10-CM

## 2014-01-16 DIAGNOSIS — Z8619 Personal history of other infectious and parasitic diseases: Secondary | ICD-10-CM | POA: Diagnosis not present

## 2014-01-16 DIAGNOSIS — I447 Left bundle-branch block, unspecified: Secondary | ICD-10-CM | POA: Diagnosis not present

## 2014-01-16 DIAGNOSIS — Z853 Personal history of malignant neoplasm of breast: Secondary | ICD-10-CM | POA: Diagnosis not present

## 2014-01-16 DIAGNOSIS — H579 Unspecified disorder of eye and adnexa: Secondary | ICD-10-CM | POA: Insufficient documentation

## 2014-01-16 DIAGNOSIS — Z8709 Personal history of other diseases of the respiratory system: Secondary | ICD-10-CM | POA: Insufficient documentation

## 2014-01-16 DIAGNOSIS — Z79899 Other long term (current) drug therapy: Secondary | ICD-10-CM | POA: Diagnosis not present

## 2014-01-16 DIAGNOSIS — K219 Gastro-esophageal reflux disease without esophagitis: Secondary | ICD-10-CM | POA: Insufficient documentation

## 2014-01-16 DIAGNOSIS — I519 Heart disease, unspecified: Secondary | ICD-10-CM | POA: Diagnosis not present

## 2014-01-16 DIAGNOSIS — H00019 Hordeolum externum unspecified eye, unspecified eyelid: Secondary | ICD-10-CM | POA: Insufficient documentation

## 2014-01-16 DIAGNOSIS — I499 Cardiac arrhythmia, unspecified: Secondary | ICD-10-CM | POA: Diagnosis not present

## 2014-01-16 HISTORY — DX: Zoster without complications: B02.9

## 2014-01-16 MED ORDER — CEPHALEXIN 500 MG PO CAPS
500.0000 mg | ORAL_CAPSULE | Freq: Three times a day (TID) | ORAL | Status: DC
Start: 2014-01-16 — End: 2014-01-26

## 2014-01-16 NOTE — ED Provider Notes (Signed)
CSN: [id number]     Arrival date & time 01/16/14  1121 History   First MD Initiated Contact with Patient 01/16/14 1136     Chief Complaint  Patient presents with  . Eye Problem     (Consider location/radiation/quality/duration/timing/severity/associated sxs/prior Treatment) Patient is a 73 y.o. female presenting with eye problem. The history is provided by the patient.  Eye Problem Location:  L eye Quality:  Aching Severity:  Mild Onset quality:  Gradual Duration:  2 days Timing:  Constant Progression:  Worsening Chronicity:  New Relieved by:  Nothing Worsened by:  Nothing tried Ineffective treatments: warm compresses.   Past Medical History  Diagnosis Date  . Bundle branch block left   . Left ventricular dysfunction   . Dyslipidemia   . IBS (irritable bowel syndrome)   . GERD (gastroesophageal reflux disease)   . Fibromyalgia   . Degenerative joint disease   . Cancer     Breast  . Gastritis   . Bronchitis     acute  . Allergy   . 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 Hyperlipidemia   . Shingles    Past Surgical History  Procedure Laterality Date  . Cardiac catheterization  08/2008  . Mastectomy, partial      Status post left   . Tonsillectomy    . Ganglion cyst excision    . Cataract extraction  4 2013  . Breast surgery  12 6 2003  . Tonsillectomy  1963   Family History  Problem Relation Age of Onset  . Heart disease Mother   . Heart disease Sister   . Diabetes Sister   . Arthritis Father   . Cancer Father   . Crohn's disease Father   . Cancer Paternal Aunt    History  Substance Use Topics  . Smoking status: Never Smoker   . Smokeless tobacco: Not on file  . Alcohol Use: No   OB History   Grav Para Term Preterm Abortions TAB SAB Ect Mult Living                 Review of Systems  Constitutional: Negative for fever and chills.  Respiratory: Negative for cough and shortness of breath.   All other systems reviewed and are  negative.     Allergies  Demerol; Hydrocodone-acetaminophen; Meperidine hcl; Morphine; Morphine and related; and Vioxx  Home Medications   Prior to Admission medications   Medication Sig Start Date End Date Taking? Authorizing Provider  BIOTIN 5000 PO Take 5,000 mg by mouth daily.     Historical Provider, MD  cholecalciferol (VITAMIN D) 1000 UNITS tablet Take 2,000 Units by mouth daily.      Historical Provider, MD  CRESTOR 5 MG tablet TAKE 1 TABLET ONCE DAILY.    Peter M [place], MD  diltiazem (CARDIZEM CD) 180 MG 24 hr capsule TAKE 2 CAPSULES DAILY.    Peter M [place], MD  DULoxetine (CYMBALTA) 60 MG capsule Take 1 capsule (60 mg total) by mouth 2 (two) times daily. 02/04/12   Midge Minium, MD  gabapentin (NEURONTIN) 100 MG capsule Take 100 mg by mouth at bedtime.     Historical Provider, MD  GuaiFENesin (MUCINEX PO) Take by mouth.    Historical Provider, MD  lubiprostone (AMITIZA) 8 MCG capsule Take 8 mcg by mouth 2 (two) times daily with a meal.     Historical Provider, MD  methocarbamol (ROBAXIN) 500 MG tablet Take 500 mg by mouth 2 (two)  times daily.     Historical Provider, MD  nadolol (CORGARD) 20 MG tablet TAKE 1 TABLET ONCE DAILY.    Peter M [place], MD  pantoprazole (PROTONIX) 40 MG tablet Take 40 mg by mouth daily.      Historical Provider, MD  traMADol (ULTRAM) 50 MG tablet Take 50 mg by mouth every 6 (six) hours as needed for pain.    Historical Provider, MD  ZETIA 10 MG tablet TAKE (1/2) TABLET DAILY.    Peter M [place], MD  zolpidem (AMBIEN) 10 MG tablet Take 0.5 tablets (5 mg total) by mouth at bedtime as needed. For sleep 02/04/12   Midge Minium, MD   BP 138/68  Pulse 100  Temp(Src) 97.9 F (36.6 C) (Oral)  Resp 18  Ht 5\' 2"  (1.575 m)  Wt 172 lb (78.019 kg)  BMI 31.45 kg/m2  SpO2 94% Physical Exam  Nursing note and vitals reviewed. Constitutional: She is oriented to person, place, and time. She appears well-developed and well-nourished. No distress.    HENT:  Head: Normocephalic and atraumatic.  Eyes: EOM are normal. Pupils are equal, round, and reactive to light. Left eye exhibits no chemosis, no discharge and no hordeolum. Right conjunctiva is not injected. Right conjunctiva has no hemorrhage. Left conjunctiva is not injected. Left conjunctiva has no hemorrhage. Right eye exhibits normal extraocular motion and no nystagmus. Left eye exhibits normal extraocular motion and no nystagmus. Right pupil is round and reactive. Left pupil is reactive.    Neck: Normal range of motion. Neck supple.  Cardiovascular: Normal rate and regular rhythm.  Exam reveals no friction rub.   No murmur heard. Pulmonary/Chest: Effort normal and breath sounds normal. No respiratory distress. She has no wheezes. She has no rales.  Abdominal: Soft. She exhibits no distension. There is no tenderness. There is no rebound.  Musculoskeletal: Normal range of motion. She exhibits no edema.  Neurological: She is alert and oriented to person, place, and time.  Skin: She is not diaphoretic.    ED Course  Procedures (including critical care time) Labs Review Labs Reviewed - No data to display  Imaging Review No results found.   EKG Interpretation None      MDM   Final diagnoses:  Stye external, left    83F here with large stye on her L upper eyelid. Small purulence at mid margin. Exam not c/w hordeolum. No fevers, vomiting. Has been using warm compresses. No systemic symptoms. Will place on antibiotics and have her f/u with Optometry. Very scant amount of pus expressed with pressure by me upon patient's request for me to push on it. Stable for discharge, given keflex.  Evelina Bucy, MD 01/16/14 [phone]

## 2014-01-16 NOTE — Discharge Instructions (Signed)

## 2014-01-16 NOTE — ED Notes (Signed)
Friday began noticing a small stye on left eye, worsening the next few days.

## 2014-01-26 ENCOUNTER — Encounter: Payer: Self-pay | Admitting: Cardiology

## 2014-01-26 ENCOUNTER — Ambulatory Visit (INDEPENDENT_AMBULATORY_CARE_PROVIDER_SITE_OTHER): Payer: Medicare Other | Admitting: Cardiology

## 2014-01-26 VITALS — BP 130/82 | HR 88 | Ht 62.0 in | Wt 171.0 lb

## 2014-01-26 DIAGNOSIS — I4460 Unspecified fascicular block: Secondary | ICD-10-CM

## 2014-01-26 DIAGNOSIS — E785 Hyperlipidemia, unspecified: Secondary | ICD-10-CM

## 2014-01-26 DIAGNOSIS — R000 Tachycardia, unspecified: Secondary | ICD-10-CM

## 2014-01-26 NOTE — Patient Instructions (Signed)
Continue your current therapy  I will see you in one year   

## 2014-01-27 NOTE — Progress Notes (Signed)
Kristina Schroeder Date of Birth: June 29, 1940   History of Present Illness: Kristina Schroeder is seen today for yearly followup. She has a history of inappropriate sinus tachycardia. She has mild left ventricular dysfunction probably related to a chronic left bundle branch block. Her tachycardia has been regulated with beta blocker and diltiazem therapy. She has done very well this past year without any chest pain, shortness of breath, palpitations, or dizziness. Her only illness was a bout of shingles. She feels very well.  Current Outpatient Prescriptions on File Prior to Visit  Medication Sig Dispense Refill  . BIOTIN 5000 PO Take 5,000 mg by mouth daily.       . cholecalciferol (VITAMIN D) 1000 UNITS tablet Take 2,000 Units by mouth daily.        . CRESTOR 5 MG tablet TAKE 1 TABLET ONCE DAILY.  30 tablet  2  . diltiazem (CARDIZEM CD) 180 MG 24 hr capsule TAKE 2 CAPSULES DAILY.  60 capsule  0  . DULoxetine (CYMBALTA) 60 MG capsule Take 1 capsule (60 mg total) by mouth 2 (two) times daily.  60 capsule  6  . GuaiFENesin (MUCINEX PO) Take by mouth.      . nadolol (CORGARD) 20 MG tablet TAKE 1 TABLET ONCE DAILY.  30 tablet  0  . pantoprazole (PROTONIX) 40 MG tablet Take 40 mg by mouth daily.        Marland Kitchen ZETIA 10 MG tablet TAKE (1/2) TABLET DAILY.  15 tablet  1  . zolpidem (AMBIEN) 10 MG tablet Take 0.5 tablets (5 mg total) by mouth at bedtime as needed. For sleep  30 tablet  3  . [DISCONTINUED] metoprolol (TOPROL XL) 50 MG 24 hr tablet Take 1 tablet (50 mg total) by mouth daily.  30 tablet  11   No current facility-administered medications on file prior to visit.    Allergies  Allergen Reactions  . Demerol     unknown  . Hydrocodone-Acetaminophen     unknown  . Meperidine Hcl     unknown  . Morphine     unknown  . Morphine And Related     unknown  . Vioxx [Rofecoxib]     unknown    Past Medical History  Diagnosis Date  . Bundle branch block left   . Left ventricular dysfunction   .  Dyslipidemia   . IBS (irritable bowel syndrome)   . GERD (gastroesophageal reflux disease)   . Fibromyalgia   . Degenerative joint disease   . Cancer     Breast  . Gastritis   . Bronchitis     acute  . Allergy   . 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 Hyperlipidemia   . Shingles     Past Surgical History  Procedure Laterality Date  . Cardiac catheterization  08/2008  . Mastectomy, partial      Status post left   . Tonsillectomy    . Ganglion cyst excision    . Cataract extraction  4 2013  . Breast surgery  12 6 2003  . Tonsillectomy  1963    History  Smoking status  . Never Smoker   Smokeless tobacco  . Not on file    History  Alcohol Use No    Family History  Problem Relation Age of Onset  . Heart disease Mother   . Heart disease Sister   . Diabetes Sister   . Arthritis Father   . Cancer Father   .  Crohn's disease Father   . Cancer Paternal Aunt     Review of Systems: As noted history of present illness.  All other systems were reviewed and are negative.  Physical Exam: BP 130/82  Pulse 88  Ht 5\' 2"  (1.575 m)  Wt 171 lb (77.565 kg)  BMI 31.27 kg/m2 The patient is alert and oriented x 3.   The HEENT exam is normal. The carotids are 2+ without bruits.  There is no thyromegaly.  There is no JVD.  The lungs are clear.    The heart exam reveals a regular rate with a normal S1 and S2.  There are no murmurs, gallops, or rubs.  The PMI is not displaced.   Abdominal exam reveals good bowel sounds.    Exam of the legs reveal no clubbing, cyanosis, or edema.  The legs are without rashes.  The distal pulses are intact.  Cranial nerves II - XII are intact.  Motor and sensory functions are intact.  The gait is normal. LABORATORY DATA: ECG today demonstrates normal sinus rhythm with a chronic left bundle branch block.  Assessment / Plan: 1. Left bundle branch block-chronic  2. Mild left ventricular dysfunction with ejection fraction of 45-50%.  Probably  related to her left bundle branch block. Asymptomatic.   3. 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  4. Hyperlipidemia.  Continue current therapy and follow up in one year.

## 2014-02-10 ENCOUNTER — Other Ambulatory Visit: Payer: Self-pay | Admitting: Cardiology

## 2014-02-16 ENCOUNTER — Other Ambulatory Visit: Payer: Self-pay | Admitting: Cardiology

## 2014-02-16 ENCOUNTER — Telehealth: Payer: Self-pay | Admitting: Cardiology

## 2014-02-16 MED ORDER — NADOLOL 20 MG PO TABS: 20.0000 mg | ORAL_TABLET | Freq: Every day | ORAL | Status: DC

## 2014-02-16 MED ORDER — ROSUVASTATIN CALCIUM 5 MG PO TABS: 5.0000 mg | ORAL_TABLET | Freq: Every day | ORAL | Status: DC

## 2014-02-16 MED ORDER — EZETIMIBE 10 MG PO TABS: 10.0000 mg | ORAL_TABLET | Freq: Every day | ORAL | Status: DC

## 2014-02-16 MED ORDER — DILTIAZEM HCL ER COATED BEADS 180 MG PO CP24: ORAL_CAPSULE | ORAL | Status: DC

## 2014-02-16 NOTE — Telephone Encounter (Signed)
Please call,she wants to talk to you about Dr Martinique not calling her refills in.

## 2014-02-16 NOTE — Telephone Encounter (Signed)
Returned call to patient she stated she needed refills on medications.Refills sent to pharmacy.

## 2014-05-11 ENCOUNTER — Other Ambulatory Visit: Payer: Self-pay

## 2014-05-11 DIAGNOSIS — Z1231 Encounter for screening mammogram for malignant neoplasm of breast: Secondary | ICD-10-CM

## 2014-05-31 ENCOUNTER — Ambulatory Visit
Admission: RE | Admit: 2014-05-31 | Discharge: 2014-05-31 | Disposition: A | Discharge status: Home / Self Care | Payer: Medicare Other | Source: Ambulatory Visit

## 2014-05-31 ENCOUNTER — Other Ambulatory Visit: Payer: Self-pay

## 2014-05-31 DIAGNOSIS — Z1231 Encounter for screening mammogram for malignant neoplasm of breast: Secondary | ICD-10-CM

## 2014-06-28 ENCOUNTER — Telehealth: Payer: Self-pay

## 2014-06-28 NOTE — Telephone Encounter (Signed)
[employer] form for nadolol 20 mg completed and faxed back to fax # [phone].

## 2014-06-29 ENCOUNTER — Ambulatory Visit (INDEPENDENT_AMBULATORY_CARE_PROVIDER_SITE_OTHER): Payer: Medicare Other | Admitting: Ophthalmology

## 2014-06-29 DIAGNOSIS — H43813 Vitreous degeneration, bilateral: Secondary | ICD-10-CM

## 2014-06-29 DIAGNOSIS — H2512 Age-related nuclear cataract, left eye: Secondary | ICD-10-CM

## 2014-06-29 DIAGNOSIS — H35343 Macular cyst, hole, or pseudohole, bilateral: Secondary | ICD-10-CM

## 2014-07-11 ENCOUNTER — Emergency Department (HOSPITAL_BASED_OUTPATIENT_CLINIC_OR_DEPARTMENT_OTHER): Payer: Medicare Other

## 2014-07-11 ENCOUNTER — Emergency Department (HOSPITAL_BASED_OUTPATIENT_CLINIC_OR_DEPARTMENT_OTHER)
Admission: EM | Admit: 2014-07-11 | Discharge: 2014-07-11 | Disposition: A | Discharge status: Home / Self Care | Payer: Medicare Other | Attending: Emergency Medicine | Admitting: Emergency Medicine

## 2014-07-11 ENCOUNTER — Encounter (HOSPITAL_BASED_OUTPATIENT_CLINIC_OR_DEPARTMENT_OTHER): Payer: Self-pay | Admitting: *Deleted

## 2014-07-11 DIAGNOSIS — Z8619 Personal history of other infectious and parasitic diseases: Secondary | ICD-10-CM | POA: Diagnosis not present

## 2014-07-11 DIAGNOSIS — E785 Hyperlipidemia, unspecified: Secondary | ICD-10-CM | POA: Diagnosis not present

## 2014-07-11 DIAGNOSIS — Z79899 Other long term (current) drug therapy: Secondary | ICD-10-CM | POA: Diagnosis not present

## 2014-07-11 DIAGNOSIS — Q248 Other specified congenital malformations of heart: Secondary | ICD-10-CM | POA: Diagnosis not present

## 2014-07-11 DIAGNOSIS — Z9889 Other specified postprocedural states: Secondary | ICD-10-CM | POA: Insufficient documentation

## 2014-07-11 DIAGNOSIS — Z8709 Personal history of other diseases of the respiratory system: Secondary | ICD-10-CM | POA: Diagnosis not present

## 2014-07-11 DIAGNOSIS — M1990 Unspecified osteoarthritis, unspecified site: Secondary | ICD-10-CM | POA: Insufficient documentation

## 2014-07-11 DIAGNOSIS — S3992XA Unspecified injury of lower back, initial encounter: Secondary | ICD-10-CM | POA: Diagnosis not present

## 2014-07-11 DIAGNOSIS — Y998 Other external cause status: Secondary | ICD-10-CM | POA: Diagnosis not present

## 2014-07-11 DIAGNOSIS — S299XXA Unspecified injury of thorax, initial encounter: Secondary | ICD-10-CM | POA: Diagnosis present

## 2014-07-11 DIAGNOSIS — Z853 Personal history of malignant neoplasm of breast: Secondary | ICD-10-CM | POA: Insufficient documentation

## 2014-07-11 DIAGNOSIS — K219 Gastro-esophageal reflux disease without esophagitis: Secondary | ICD-10-CM | POA: Insufficient documentation

## 2014-07-11 DIAGNOSIS — S2232XA Fracture of one rib, left side, initial encounter for closed fracture: Secondary | ICD-10-CM | POA: Insufficient documentation

## 2014-07-11 DIAGNOSIS — Y9289 Other specified places as the place of occurrence of the external cause: Secondary | ICD-10-CM | POA: Diagnosis not present

## 2014-07-11 DIAGNOSIS — X58XXXA Exposure to other specified factors, initial encounter: Secondary | ICD-10-CM | POA: Insufficient documentation

## 2014-07-11 DIAGNOSIS — M797 Fibromyalgia: Secondary | ICD-10-CM | POA: Insufficient documentation

## 2014-07-11 DIAGNOSIS — Y9389 Activity, other specified: Secondary | ICD-10-CM | POA: Insufficient documentation

## 2014-07-11 DIAGNOSIS — I519 Heart disease, unspecified: Secondary | ICD-10-CM | POA: Diagnosis not present

## 2014-07-11 MED ORDER — IBUPROFEN 600 MG PO TABS: 600.0000 mg | ORAL_TABLET | Freq: Four times a day (QID) | ORAL | Status: DC | PRN

## 2014-07-11 MED ORDER — TRAMADOL HCL 50 MG PO TABS: 50.0000 mg | ORAL_TABLET | Freq: Four times a day (QID) | ORAL | Status: AC | PRN

## 2014-07-11 NOTE — Discharge Instructions (Signed)

## 2014-07-11 NOTE — ED Notes (Signed)
Pain in her left ribs. She stretched to sit in a chair and lean over and felt a pop.
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
## 2015-01-30 ENCOUNTER — Other Ambulatory Visit: Payer: Self-pay | Admitting: Cardiology

## 2015-01-30 NOTE — Telephone Encounter (Signed)
Rx(s) sent to pharmacy electronically.  

## 2015-03-08 ENCOUNTER — Telehealth: Payer: Self-pay | Admitting: Cardiology

## 2015-03-08 NOTE — Telephone Encounter (Signed)
New Message Pt had questions concerning possible lab panel for appt on 9/30 w/ [place]. Please call back and discuss.

## 2015-03-08 NOTE — Telephone Encounter (Signed)
Returned call. Pt wanted to do labwork same day as appt.  Advised to fast day of appt, inform Dr. Martinique so labs can be ordered and she can go to get blood drawn after appt.  Pt states this plan is optimal for her. No further concerns.

## 2015-03-10 ENCOUNTER — Ambulatory Visit: Payer: Medicare Other | Admitting: Cardiology

## 2015-03-11 ENCOUNTER — Other Ambulatory Visit: Payer: Self-pay | Admitting: Cardiology

## 2015-03-22 ENCOUNTER — Encounter: Payer: Self-pay | Admitting: Cardiology

## 2015-03-22 ENCOUNTER — Ambulatory Visit (INDEPENDENT_AMBULATORY_CARE_PROVIDER_SITE_OTHER): Payer: Medicare Other | Admitting: Cardiology

## 2015-03-22 VITALS — BP 112/68 | HR 78 | Ht 63.0 in | Wt 170.7 lb

## 2015-03-22 DIAGNOSIS — E785 Hyperlipidemia, unspecified: Secondary | ICD-10-CM | POA: Diagnosis not present

## 2015-03-22 DIAGNOSIS — I4460 Unspecified fascicular block: Secondary | ICD-10-CM

## 2015-03-22 DIAGNOSIS — R000 Tachycardia, unspecified: Secondary | ICD-10-CM

## 2015-03-22 DIAGNOSIS — Z79899 Other long term (current) drug therapy: Secondary | ICD-10-CM | POA: Diagnosis not present

## 2015-03-22 MED ORDER — DILTIAZEM HCL ER COATED BEADS 180 MG PO CP24: 360.0000 mg | ORAL_CAPSULE | Freq: Every day | ORAL | Status: DC

## 2015-03-22 MED ORDER — ROSUVASTATIN CALCIUM 5 MG PO TABS: 5.0000 mg | ORAL_TABLET | Freq: Every day | ORAL | Status: DC

## 2015-03-22 MED ORDER — EZETIMIBE 10 MG PO TABS: 10.0000 mg | ORAL_TABLET | Freq: Every day | ORAL | Status: DC

## 2015-03-22 MED ORDER — NADOLOL 20 MG PO TABS: 20.0000 mg | ORAL_TABLET | Freq: Every day | ORAL | Status: DC

## 2015-03-22 NOTE — Progress Notes (Signed)
Kristina Schroeder Date of Birth: 1940-10-20   History of Present Illness: Kristina Schroeder is seen today for yearly followup. She has a history of inappropriate sinus tachycardia. She has mild left ventricular dysfunction probably related to a chronic left bundle branch block. Her tachycardia has been regulated with beta blocker and diltiazem therapy. She has done very well this past year without any chest pain, shortness of breath, palpitations, or dizziness.She feels very well. She does note some fibromyalgia pain intermittently.  Current Outpatient Prescriptions on File Prior to Visit  Medication Sig Dispense Refill  . baclofen (LIORESAL) 10 MG tablet Take 10 mg by mouth 2 (two) times daily.     Marland Kitchen BIOTIN 5000 PO Take 5,000 mg by mouth daily.     . cholecalciferol (VITAMIN D) 1000 UNITS tablet Take 2,000 Units by mouth daily.      . DULoxetine (CYMBALTA) 60 MG capsule Take 1 capsule (60 mg total) by mouth 2 (two) times daily. 60 capsule 6  . GuaiFENesin (MUCINEX PO) Take 1,200 mg by mouth daily as needed.     Marland Kitchen ibuprofen (ADVIL,MOTRIN) 600 MG tablet Take 1 tablet (600 mg total) by mouth every 6 (six) hours as needed. 30 tablet 0  . pantoprazole (PROTONIX) 40 MG tablet Take 40 mg by mouth daily.      . traMADol (ULTRAM) 50 MG tablet Take 1 tablet (50 mg total) by mouth every 6 (six) hours as needed. 15 tablet 0  . zolpidem (AMBIEN) 10 MG tablet Take 0.5 tablets (5 mg total) by mouth at bedtime as needed. For sleep 30 tablet 3  . [DISCONTINUED] metoprolol (TOPROL XL) 50 MG 24 hr tablet Take 1 tablet (50 mg total) by mouth daily. 30 tablet 11   No current facility-administered medications on file prior to visit.    Allergies  Allergen Reactions  . Demerol     unknown  . Hydrocodone-Acetaminophen     unknown  . Meperidine Hcl     unknown  . Morphine     unknown  . Morphine And Related     unknown  . Vioxx [Rofecoxib]     unknown    Past Medical History  Diagnosis Date  . Bundle branch  block left   . Left ventricular dysfunction   . Dyslipidemia   . IBS (irritable bowel syndrome)   . GERD (gastroesophageal reflux disease)   . Fibromyalgia   . Degenerative joint disease   . Cancer (HCC)     Breast  . Gastritis   . Bronchitis     acute  . Allergy   . 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 Hyperlipidemia   . Shingles     Past Surgical History  Procedure Laterality Date  . Cardiac catheterization  08/2008  . Mastectomy, partial      Status post left   . Tonsillectomy    . Ganglion cyst excision    . Cataract extraction  4 2013  . Breast surgery  12 6 2003  . Tonsillectomy  1963    History  Smoking status  . Never Smoker   Smokeless tobacco  . Not on file    History  Alcohol Use No    Family History  Problem Relation Age of Onset  . Heart disease Mother   . Heart disease Sister   . Diabetes Sister   . Arthritis Father   . Cancer Father   . Crohn's disease Father   . Cancer Paternal  Aunt     Review of Systems: As noted history of present illness.  All other systems were reviewed and are negative.  Physical Exam: BP 112/68 mmHg  Pulse 78  Ht 5\' 3"  (1.6 m)  Wt 77.429 kg (170 lb 11.2 oz)  BMI 30.25 kg/m2  SpO2 95% The patient is alert and oriented x 3.   The HEENT exam is normal. The carotids are 2+ without bruits.  There is no thyromegaly.  There is no JVD.  The lungs are clear.    The heart exam reveals a regular rate with a normal S1 and S2.  There are no murmurs, gallops, or rubs.  The PMI is not displaced.   Abdominal exam reveals good bowel sounds.    Exam of the legs reveal no clubbing, cyanosis, or edema.  The legs are without rashes.  The distal pulses are intact.  Cranial nerves II - XII are intact.  Motor and sensory functions are intact.  The gait is normal.  LABORATORY DATA:   Assessment / Plan: 1. Left bundle branch block-chronic  2. Mild left ventricular dysfunction with ejection fraction of 45-50%.  Probably  related to her left bundle branch block. Asymptomatic.   3. 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  4. Hyperlipidemia.  Medications refilled today. Will arrange for fasting lab work. Follow up in one year.

## 2015-03-22 NOTE — Patient Instructions (Signed)
Continue your current therapy  We will arrange fasting lab work  I will see you in one year

## 2015-03-30 ENCOUNTER — Ambulatory Visit (INDEPENDENT_AMBULATORY_CARE_PROVIDER_SITE_OTHER): Payer: Medicare Other | Admitting: Ophthalmology

## 2015-05-01 ENCOUNTER — Ambulatory Visit (INDEPENDENT_AMBULATORY_CARE_PROVIDER_SITE_OTHER): Payer: Medicare Other | Admitting: Ophthalmology

## 2015-05-10 ENCOUNTER — Other Ambulatory Visit: Payer: Self-pay | Admitting: Cardiology

## 2015-05-10 NOTE — Telephone Encounter (Signed)
Rx request sent to pharmacy.  

## 2015-05-15 ENCOUNTER — Ambulatory Visit (INDEPENDENT_AMBULATORY_CARE_PROVIDER_SITE_OTHER): Payer: Medicare Other | Admitting: Ophthalmology

## 2015-05-15 DIAGNOSIS — H43813 Vitreous degeneration, bilateral: Secondary | ICD-10-CM

## 2015-05-15 DIAGNOSIS — H35343 Macular cyst, hole, or pseudohole, bilateral: Secondary | ICD-10-CM

## 2015-05-15 DIAGNOSIS — H2512 Age-related nuclear cataract, left eye: Secondary | ICD-10-CM | POA: Diagnosis not present

## 2015-06-27 ENCOUNTER — Ambulatory Visit: Payer: Medicare Other | Admitting: Cardiology

## 2015-07-27 ENCOUNTER — Other Ambulatory Visit: Payer: Self-pay

## 2015-07-27 DIAGNOSIS — Z1231 Encounter for screening mammogram for malignant neoplasm of breast: Secondary | ICD-10-CM

## 2015-08-21 ENCOUNTER — Ambulatory Visit
Admission: RE | Admit: 2015-08-21 | Discharge: 2015-08-21 | Disposition: A | Discharge status: Home / Self Care | Payer: Medicare Other | Source: Ambulatory Visit

## 2015-08-21 DIAGNOSIS — Z1231 Encounter for screening mammogram for malignant neoplasm of breast: Secondary | ICD-10-CM

## 2015-12-21 ENCOUNTER — Encounter: Payer: Medicare Other | Attending: Family Medicine | Admitting: Skilled Nursing Facility1

## 2015-12-21 ENCOUNTER — Encounter: Payer: Self-pay | Admitting: Skilled Nursing Facility1

## 2015-12-21 VITALS — Ht 62.0 in | Wt 165.0 lb

## 2015-12-21 DIAGNOSIS — Z713 Dietary counseling and surveillance: Secondary | ICD-10-CM | POA: Diagnosis not present

## 2015-12-21 DIAGNOSIS — E119 Type 2 diabetes mellitus without complications: Secondary | ICD-10-CM | POA: Insufficient documentation

## 2015-12-21 NOTE — Progress Notes (Signed)
Diabetes Self-Management Education  Visit Type: First/Initial  Appt. Start Time: 2:00 Appt. End Time: 3:00  12/21/2015  Ms. Kristina Schroeder, identified by name and date of birth, is a 75 y.o. female with a diagnosis of Diabetes: Type 2.   ASSESSMENT  Height 5\' 2"  (1.575 m), weight 165 lb (74.844 kg). Body mass index is 30.17 kg/(m^2).  Pt states she has been taking Benefiber 2 times a day every day.       Diabetes Self-Management Education - 12/21/15 1407    Visit Information   Visit Type First/Initial   Initial Visit   Diabetes Type Type 2   Are you currently following a meal plan? No   Are you taking your medications as prescribed? Yes   Date Diagnosed 6 weeks ago   Health Coping   How would you rate your overall health? Good   Psychosocial Assessment   Patient Belief/Attitude about Diabetes Motivated to manage diabetes   Pre-Education Assessment   Patient understands the diabetes disease and treatment process. Needs Instruction   Patient understands incorporating nutritional management into lifestyle. Needs Instruction   Patient undertands incorporating physical activity into lifestyle. Needs Instruction   Patient understands using medications safely. Needs Instruction   Patient understands monitoring blood glucose, interpreting and using results Needs Instruction   Patient understands prevention, detection, and treatment of acute complications. Needs Instruction   Patient understands prevention, detection, and treatment of chronic complications. Needs Instruction   Patient understands how to develop strategies to address psychosocial issues. Needs Instruction   Patient understands how to develop strategies to promote health/change behavior. Needs Instruction   Complications   Last HgB A1C per patient/outside source 9.1 %   How often do you check your blood sugar? 1-2 times/day   Fasting Blood glucose range (mg/dL) 130-179   Have you had a dilated eye exam in the past 12  months? Yes   Have you had a dental exam in the past 12 months? Yes   Are you checking your feet? Yes   How many days per week are you checking your feet? 7   Dietary Intake   Breakfast 2 peices of toast with butter and sugar free jelly   Lunch none------salad-------greek yogurt and peanuts   Dinner meat, baked potato, salad   Beverage(s) water, sugar free flavorings    Exercise   Exercise Type ADL's   Patient Education   Previous Diabetes Education No   Nutrition management  Role of diet in the treatment of diabetes and the relationship between the three main macronutrients and blood glucose level;Food label reading, portion sizes and measuring food.;Carbohydrate counting;Reviewed blood glucose goals for pre and post meals and how to evaluate the patients' food intake on their blood glucose level.   Physical activity and exercise  Role of exercise on diabetes management, blood pressure control and cardiac health.;Identified with patient nutritional and/or medication changes necessary with exercise.;Helped patient identify appropriate exercises in relation to his/her diabetes, diabetes complications and other health issue.   Monitoring Taught/evaluated SMBG meter.;Purpose and frequency of SMBG.;Daily foot exams;Yearly dilated eye exam;Taught/discussed recording of test results and interpretation of SMBG.;Identified appropriate SMBG and/or A1C goals.   Acute complications Taught treatment of hypoglycemia - the 15 rule.   Chronic complications Assessed and discussed foot care and prevention of foot problems;Retinopathy and reason for yearly dilated eye exams   Individualized Goals (developed by patient)   Nutrition Follow meal plan discussed;General guidelines for healthy choices and portions discussed;Adjust meds/carbs with exercise as  discussed   Physical Activity Exercise 3-5 times per week;15 minutes per day   Post-Education Assessment   Patient understands the diabetes disease and treatment  process. Demonstrates understanding / competency   Patient understands incorporating nutritional management into lifestyle. Demonstrates understanding / competency   Patient undertands incorporating physical activity into lifestyle. Demonstrates understanding / competency   Patient understands using medications safely. Demonstrates understanding / competency   Patient understands monitoring blood glucose, interpreting and using results Demonstrates understanding / competency   Patient understands prevention, detection, and treatment of acute complications. Demonstrates understanding / competency   Patient understands prevention, detection, and treatment of chronic complications. Demonstrates understanding / competency   Patient understands how to develop strategies to address psychosocial issues. Demonstrates understanding / competency   Patient understands how to develop strategies to promote health/change behavior. Demonstrates understanding / competency   Outcomes   Expected Outcomes Demonstrated interest in learning. Expect positive outcomes   Future DMSE PRN   Program Status Completed      Individualized Plan for Diabetes Self-Management Training:   Learning Objective:  Patient will have a greater understanding of diabetes self-management. Patient education plan is to attend individual and/or group sessions per assessed needs and concerns.   Plan:   There are no Patient Instructions on file for this visit.  Expected Outcomes:  Demonstrated interest in learning. Expect positive outcomes  Education material provided: Living Well with Diabetes, A1C conversion sheet, Meal plan card, My Plate and Snack sheet  If problems or questions, patient to contact team via:  Phone  Future DSME appointment: PRN

## 2015-12-24 ENCOUNTER — Other Ambulatory Visit: Payer: Self-pay | Admitting: Cardiology

## 2015-12-25 NOTE — Telephone Encounter (Signed)
Rx(s) sent to pharmacy electronically.  

## 2016-01-29 ENCOUNTER — Ambulatory Visit: Payer: Medicare Other | Admitting: Skilled Nursing Facility1

## 2016-02-19 ENCOUNTER — Ambulatory Visit (INDEPENDENT_AMBULATORY_CARE_PROVIDER_SITE_OTHER): Payer: Medicare Other | Admitting: Ophthalmology

## 2016-02-19 DIAGNOSIS — H2513 Age-related nuclear cataract, bilateral: Secondary | ICD-10-CM

## 2016-02-19 DIAGNOSIS — H35343 Macular cyst, hole, or pseudohole, bilateral: Secondary | ICD-10-CM

## 2016-02-19 DIAGNOSIS — H43813 Vitreous degeneration, bilateral: Secondary | ICD-10-CM | POA: Diagnosis not present

## 2016-03-25 ENCOUNTER — Other Ambulatory Visit: Payer: Self-pay | Admitting: Cardiology

## 2016-03-25 NOTE — Telephone Encounter (Signed)
REFIL 

## 2016-04-24 ENCOUNTER — Other Ambulatory Visit: Payer: Self-pay | Admitting: Cardiology

## 2016-04-24 NOTE — Progress Notes (Signed)
Kristina Schroeder Date of Birth: May 01, 1941   History of Present Illness: Kristina Schroeder is seen today for yearly followup. She has a history of inappropriate sinus tachycardia. She has mild left ventricular dysfunction probably related to a chronic left bundle branch block. Her tachycardia has been regulated with beta blocker and diltiazem therapy. She has done very well this past year without any chest pain, shortness of breath, palpitations, or dizziness. She was diagnosed with diabetes earlier this year and is now on metformin. Last A1c was 6.8%. She has lost 5 lbs.  Current Outpatient Prescriptions on File Prior to Visit  Medication Sig Dispense Refill  . baclofen (LIORESAL) 10 MG tablet Take 10 mg by mouth 2 (two) times daily.     Marland Kitchen BIOTIN 5000 PO Take 5,000 mg by mouth daily.     . cholecalciferol (VITAMIN D) 1000 UNITS tablet Take 2,000 Units by mouth daily.      . CRESTOR 5 MG tablet TAKE ONE TABLET AT BEDTIME. 30 tablet 0  . diltiazem (CARDIZEM CD) 180 MG 24 hr capsule TAKE 2 CAPSULES DAILY. 60 capsule 11  . DULoxetine (CYMBALTA) 60 MG capsule Take 1 capsule (60 mg total) by mouth 2 (two) times daily. 60 capsule 6  . GuaiFENesin (MUCINEX PO) Take 1,200 mg by mouth daily as needed.     Marland Kitchen ibuprofen (ADVIL,MOTRIN) 600 MG tablet Take 1 tablet (600 mg total) by mouth every 6 (six) hours as needed. 30 tablet 0  . nadolol (CORGARD) 20 MG tablet Take 1 tablet (20 mg total) by mouth daily. 30 tablet 3  . pantoprazole (PROTONIX) 40 MG tablet Take 40 mg by mouth daily.      . traMADol (ULTRAM) 50 MG tablet Take 1 tablet (50 mg total) by mouth every 6 (six) hours as needed. 15 tablet 0  . ZETIA 10 MG tablet TAKE 1 TABLET ONCE DAILY. 30 tablet 0  . zolpidem (AMBIEN) 10 MG tablet Take 0.5 tablets (5 mg total) by mouth at bedtime as needed. For sleep 30 tablet 3  . [DISCONTINUED] metoprolol (TOPROL XL) 50 MG 24 hr tablet Take 1 tablet (50 mg total) by mouth daily. 30 tablet 11   No current  facility-administered medications on file prior to visit.     Allergies  Allergen Reactions  . Demerol     unknown  . Hydrocodone-Acetaminophen     unknown  . Meperidine Hcl     unknown  . Morphine     unknown  . Morphine And Related     unknown  . Vioxx [Rofecoxib]     unknown    Past Medical History:  Diagnosis Date  . Allergy   . Bronchitis    acute  . Bundle branch block left   . Cancer [facility])    Breast  . 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 Degenerative joint disease   . Dyslipidemia   . Fibromyalgia   . Gastritis   . GERD (gastroesophageal reflux disease)   . Hyperlipidemia   . IBS (irritable bowel syndrome)   . Left ventricular dysfunction   . Shingles     Past Surgical History:  Procedure Laterality Date  . BREAST SURGERY  12 6 2003  . CARDIAC CATHETERIZATION  08/2008  . CATARACT EXTRACTION  4 2013  . GANGLION CYST EXCISION    . MASTECTOMY, PARTIAL     Status post left   . TONSILLECTOMY    . TONSILLECTOMY  1963  History  Smoking Status  . Never Smoker  Smokeless Tobacco  . Not on file    History  Alcohol Use No    Family History  Problem Relation Age of Onset  . Heart disease Mother   . Heart disease Sister   . Diabetes Sister   . Arthritis Father   . Cancer Father   . Crohn's disease Father   . Cancer Paternal Aunt     Review of Systems: As noted history of present illness.  All other systems were reviewed and are negative.  Physical Exam: BP 130/74   Pulse 82   Ht 5\' 3"  (1.6 m)   Wt 165 lb 6 oz (75 kg)   BMI 29.29 kg/m   The patient is alert and oriented x 3.   The HEENT exam is normal. The carotids are 2+ without bruits.  There is no thyromegaly.  There is no JVD.  The lungs are clear.    The heart exam reveals a regular rate with a normal S1 and S2.  There are no murmurs, gallops, or rubs.  The PMI is not displaced.   Abdominal exam reveals good bowel sounds.    Exam of the legs reveal no clubbing,  cyanosis, or edema.  The legs are without rashes.  The distal pulses are intact.  Cranial nerves II - XII are intact.  Motor and sensory functions are intact.  The gait is normal.  LABORATORY DATA: Lab Results  Component Value Date   WBC 7.1 07/02/2007   HGB 14.3 07/02/2007   HCT 41.6 07/02/2007   PLT 303 07/02/2007   GLUCOSE 119 (H) 09/04/2011   CHOL 129 09/04/2011   TRIG 119.0 09/04/2011   HDL 45.50 09/04/2011   LDLCALC 60 09/04/2011   ALT 18 09/04/2011   AST 24 09/04/2011   NA 139 09/04/2011   K 4.2 09/04/2011   CL 104 09/04/2011   CREATININE 0.7 09/04/2011   BUN 10 09/04/2011   CO2 28 09/04/2011   Ecg today shows NSR with rate 82. LBBB. I have personally reviewed and interpreted this study.  Assessment / Plan: 1. Left bundle branch block-chronic  2. Mild left ventricular dysfunction with ejection fraction of 45-50%. Probably  related to her left bundle branch block. Asymptomatic.   3. 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  4. Hyperlipidemia.  5. DM type 2. Per primary care.   Follow up in one year.

## 2016-04-26 ENCOUNTER — Ambulatory Visit (INDEPENDENT_AMBULATORY_CARE_PROVIDER_SITE_OTHER): Payer: Medicare Other | Admitting: Cardiology

## 2016-04-26 ENCOUNTER — Encounter: Payer: Self-pay | Admitting: Cardiology

## 2016-04-26 VITALS — BP 130/74 | HR 82 | Ht 63.0 in | Wt 165.4 lb

## 2016-04-26 DIAGNOSIS — I4460 Unspecified fascicular block: Secondary | ICD-10-CM

## 2016-04-26 DIAGNOSIS — E7800 Pure hypercholesterolemia, unspecified: Secondary | ICD-10-CM | POA: Diagnosis not present

## 2016-04-26 DIAGNOSIS — R000 Tachycardia, unspecified: Secondary | ICD-10-CM

## 2016-04-26 NOTE — Patient Instructions (Signed)
Continue your current therapy  Continue efforts at weight loss  I will see you in one year

## 2016-05-18 ENCOUNTER — Other Ambulatory Visit: Payer: Self-pay | Admitting: Cardiology

## 2016-05-20 ENCOUNTER — Other Ambulatory Visit: Payer: Self-pay | Admitting: *Deleted

## 2016-05-20 ENCOUNTER — Telehealth: Payer: Self-pay | Admitting: Cardiology

## 2016-05-20 MED ORDER — DILTIAZEM HCL ER COATED BEADS 180 MG PO CP24: 360.0000 mg | ORAL_CAPSULE | Freq: Every day | ORAL | 3 refills | Status: DC

## 2016-05-20 NOTE — Telephone Encounter (Signed)
°*  STAT* If patient is at the pharmacy, call can be transferred to refill team.   1. Which medications need to be refilled? (please list name of each medication and dose if known) Diliazem 180mg    2. Which pharmacy/location (including street and city if local pharmacy) is medication to be sent to?[facility]   3. Do they need a 30 day or 90 day supply?needs more than 30 days

## 2016-05-20 NOTE — Telephone Encounter (Signed)
Tried to call patient to let her know her rx has been sent to [facility] and there had seem to be a mix up when her Diltiazem was reordered.  A female answered the phone and said "No. Who do you want to talk to?" when I said again said Annye English, before I could announce who I was she yelled, "No you can not" and the phone was disconnected. Her refill was sent in with another provider's name stating she needed to contact office for additional refills for appointment with Dr Percival Spanish when she was seen 3 wks ago by Dr Martinique and asked to follow up in a year.  Her refill For Diltiazem 180mg  , 2 capsules daily has been sent to [facility], #180 with 3 RF.

## 2016-05-28 ENCOUNTER — Inpatient Hospital Stay (HOSPITAL_COMMUNITY)
Admission: EM | Admit: 2016-05-28 | Discharge: 2016-06-02 | DRG: 166 | Disposition: A | Discharge status: Home / Self Care | Payer: Medicare Other | Attending: Internal Medicine | Admitting: Internal Medicine

## 2016-05-28 ENCOUNTER — Encounter (HOSPITAL_COMMUNITY): Payer: Self-pay | Admitting: Emergency Medicine

## 2016-05-28 ENCOUNTER — Telehealth: Payer: Self-pay | Admitting: Cardiology

## 2016-05-28 DIAGNOSIS — K589 Irritable bowel syndrome without diarrhea: Secondary | ICD-10-CM | POA: Diagnosis present

## 2016-05-28 DIAGNOSIS — J84114 Acute interstitial pneumonitis: Principal | ICD-10-CM | POA: Diagnosis present

## 2016-05-28 DIAGNOSIS — K449 Diaphragmatic hernia without obstruction or gangrene: Secondary | ICD-10-CM | POA: Diagnosis present

## 2016-05-28 DIAGNOSIS — Z853 Personal history of malignant neoplasm of breast: Secondary | ICD-10-CM

## 2016-05-28 DIAGNOSIS — K769 Liver disease, unspecified: Secondary | ICD-10-CM | POA: Diagnosis present

## 2016-05-28 DIAGNOSIS — R0902 Hypoxemia: Secondary | ICD-10-CM | POA: Diagnosis present

## 2016-05-28 DIAGNOSIS — M797 Fibromyalgia: Secondary | ICD-10-CM | POA: Diagnosis present

## 2016-05-28 DIAGNOSIS — Z7982 Long term (current) use of aspirin: Secondary | ICD-10-CM

## 2016-05-28 DIAGNOSIS — J9601 Acute respiratory failure with hypoxia: Secondary | ICD-10-CM | POA: Diagnosis present

## 2016-05-28 DIAGNOSIS — E785 Hyperlipidemia, unspecified: Secondary | ICD-10-CM | POA: Diagnosis present

## 2016-05-28 DIAGNOSIS — M1990 Unspecified osteoarthritis, unspecified site: Secondary | ICD-10-CM | POA: Diagnosis present

## 2016-05-28 DIAGNOSIS — E119 Type 2 diabetes mellitus without complications: Secondary | ICD-10-CM | POA: Diagnosis present

## 2016-05-28 DIAGNOSIS — R000 Tachycardia, unspecified: Secondary | ICD-10-CM | POA: Diagnosis not present

## 2016-05-28 DIAGNOSIS — K219 Gastro-esophageal reflux disease without esophagitis: Secondary | ICD-10-CM | POA: Diagnosis present

## 2016-05-28 DIAGNOSIS — I2510 Atherosclerotic heart disease of native coronary artery without angina pectoris: Secondary | ICD-10-CM | POA: Diagnosis present

## 2016-05-28 DIAGNOSIS — I700 Atherosclerosis of aorta: Secondary | ICD-10-CM | POA: Diagnosis present

## 2016-05-28 DIAGNOSIS — I447 Left bundle-branch block, unspecified: Secondary | ICD-10-CM | POA: Diagnosis present

## 2016-05-28 DIAGNOSIS — Z79899 Other long term (current) drug therapy: Secondary | ICD-10-CM

## 2016-05-28 DIAGNOSIS — D869 Sarcoidosis, unspecified: Secondary | ICD-10-CM | POA: Diagnosis present

## 2016-05-28 DIAGNOSIS — Q249 Congenital malformation of heart, unspecified: Secondary | ICD-10-CM

## 2016-05-28 DIAGNOSIS — Z9889 Other specified postprocedural states: Secondary | ICD-10-CM

## 2016-05-28 DIAGNOSIS — Z77120 Contact with and (suspected) exposure to mold (toxic): Secondary | ICD-10-CM

## 2016-05-28 DIAGNOSIS — Z7984 Long term (current) use of oral hypoglycemic drugs: Secondary | ICD-10-CM

## 2016-05-28 DIAGNOSIS — Z8249 Family history of ischemic heart disease and other diseases of the circulatory system: Secondary | ICD-10-CM

## 2016-05-28 DIAGNOSIS — J9621 Acute and chronic respiratory failure with hypoxia: Secondary | ICD-10-CM | POA: Diagnosis present

## 2016-05-28 LAB — BASIC METABOLIC PANEL
Anion gap: 10 (ref 5–15)
BUN: 9 mg/dL (ref 6–20)
CO2: 23 mmol/L (ref 22–32)
CREATININE: 0.68 mg/dL (ref 0.44–1.00)
Calcium: 9.5 mg/dL (ref 8.9–10.3)
Chloride: 103 mmol/L (ref 101–111)
GFR calc Af Amer: >60 mL/min (ref >60)
GFR calc non Af Amer: >60 mL/min (ref >60)
Glucose, Bld: 115 mg/dL — ABNORMAL HIGH (ref 65–99)
Potassium: 4.4 mmol/L (ref 3.5–5.1)
Sodium: 136 mmol/L (ref 135–145)

## 2016-05-28 LAB — CBC WITH DIFFERENTIAL/PLATELET
Basophils Absolute: 0.1 10*3/uL (ref 0.0–0.1)
Basophils Relative: 1 %
Eosinophils Absolute: 0.5 10*3/uL (ref 0.0–0.7)
Eosinophils Relative: 5 %
HCT: 46.4 % — ABNORMAL HIGH (ref 36.0–46.0)
HEMOGLOBIN: 15.2 g/dL — AB (ref 12.0–15.0)
LYMPHS ABS: 1.6 10*3/uL (ref 0.7–4.0)
Lymphocytes Relative: 15 %
MCH: 28.3 pg (ref 26.0–34.0)
MCHC: 32.8 g/dL (ref 30.0–36.0)
MCV: 86.4 fL (ref 78.0–100.0)
Monocytes Absolute: 1.3 10*3/uL — ABNORMAL HIGH (ref 0.1–1.0)
Monocytes Relative: 12 %
NEUTROS PCT: 67 %
Neutro Abs: 7.1 10*3/uL (ref 1.7–7.7)
Platelets: 292 10*3/uL (ref 150–400)
RBC: 5.37 MIL/uL — ABNORMAL HIGH (ref 3.87–5.11)
RDW: 14 % (ref 11.5–15.5)
WBC: 10.5 10*3/uL (ref 4.0–10.5)

## 2016-05-28 LAB — BRAIN NATRIURETIC PEPTIDE: B Natriuretic Peptide: 22.1 pg/mL (ref 0.0–100.0)

## 2016-05-28 LAB — I-STAT TROPONIN, ED: Troponin i, poc: 0 ng/mL (ref 0.00–0.08)

## 2016-05-28 MED ORDER — IPRATROPIUM-ALBUTEROL 0.5-2.5 (3) MG/3ML IN SOLN
RESPIRATORY_TRACT | Status: AC
  Administered 2016-05-28: 3 mL via RESPIRATORY_TRACT
  Filled 2016-05-28: qty 3

## 2016-05-28 MED ORDER — IPRATROPIUM-ALBUTEROL 0.5-2.5 (3) MG/3ML IN SOLN
3.0000 mL | Freq: Once | RESPIRATORY_TRACT | Status: AC
Start: 2016-05-28 — End: 2016-05-28
  Administered 2016-05-28: 3 mL via RESPIRATORY_TRACT

## 2016-05-28 NOTE — ED Notes (Signed)
The pt has been placedd on nasal 02 at 2 liters

## 2016-05-28 NOTE — ED Provider Notes (Signed)
[facility] DEPT Provider Note   CSN: [id number] Arrival date & time: 05/28/16  [id number]     History   Chief Complaint Chief Complaint  Patient presents with  . Cough    HPI Kristina Schroeder is a 75 y.o. female.  The history is provided by the patient, the spouse and a friend. No language interpreter was used.  Cough  This is a new problem. The current episode started more than 1 week ago (2w symptoms). The problem occurs constantly. The problem has not changed since onset.The cough is productive of sputum. There has been no fever. Associated symptoms include shortness of breath (worsens with exertion, states even walking from one side of the house to the other is now difficult). Pertinent negatives include no chest pain, no chills, no sweats, no rhinorrhea, no sore throat and no wheezing. She is not a smoker. Her past medical history is significant for bronchitis.    Past Medical History:  Diagnosis Date  . Allergy   . Bronchitis    acute  . Bundle branch block left   . Cancer [facility])    Breast  . 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 Degenerative joint disease   . Dyslipidemia   . Fibromyalgia   . Gastritis   . GERD (gastroesophageal reflux disease)   . Hyperlipidemia   . IBS (irritable bowel syndrome)   . Left ventricular dysfunction   . Shingles     Patient Active Problem List   Diagnosis Date Noted  . Bronchitis 03/02/2012  . Sinusitis acute 10/31/2011  . Hyperlipidemia 10/15/2011  . Fibromyalgia 10/15/2011  . IBS (irritable bowel syndrome) 10/15/2011  . GERD (gastroesophageal reflux disease) 10/15/2011  . Inappropriate sinus node tachycardia 09/26/2008  . CARCINOMA IN SITU OF BREAST 09/24/2008  . LEFT BUNDLE BRANCH HEMIBLOCK 09/24/2008  . LEFT VENTRICULAR FUNCTION, DECREASED 09/24/2008    Past Surgical History:  Procedure Laterality Date  . BREAST SURGERY  12 6 2003  . CARDIAC CATHETERIZATION  08/2008  . CATARACT EXTRACTION  4 2013  .  GANGLION CYST EXCISION    . MASTECTOMY, PARTIAL     Status post left   . TONSILLECTOMY    . TONSILLECTOMY  1963    OB History    No data available       Home Medications    Prior to Admission medications   Medication Sig Start Date End Date Taking? Authorizing Provider  baclofen (LIORESAL) 10 MG tablet Take 10 mg by mouth 2 (two) times daily.  01/05/14   Historical Provider, MD  BIOTIN 5000 PO Take 5,000 mg by mouth daily.     Historical Provider, MD  cholecalciferol (VITAMIN D) 1000 UNITS tablet Take 2,000 Units by mouth daily.      Historical Provider, MD  CRESTOR 5 MG tablet TAKE ONE TABLET AT BEDTIME. 04/24/16   Peter M Martinique, MD  diltiazem (CARDIZEM CD) 180 MG 24 hr capsule Take 2 capsules (360 mg total) by mouth daily. 05/20/16   Peter M Martinique, MD  DULoxetine (CYMBALTA) 60 MG capsule Take 1 capsule (60 mg total) by mouth 2 (two) times daily. 02/04/12   Midge Minium, MD  GuaiFENesin (MUCINEX PO) Take 1,200 mg by mouth daily as needed.     Historical Provider, MD  ibuprofen (ADVIL,MOTRIN) 600 MG tablet Take 1 tablet (600 mg total) by mouth every 6 (six) hours as needed. 07/11/14   Charlesetta Shanks, MD  metFORMIN (GLUCOPHAGE) 500 MG tablet Take 1,000 mg  by mouth at bedtime.    Historical Provider, MD  nadolol (CORGARD) 20 MG tablet Take 1 tablet (20 mg total) by mouth daily. 12/25/15   Peter M Martinique, MD  pantoprazole (PROTONIX) 40 MG tablet Take 40 mg by mouth daily.      Historical Provider, MD  traMADol (ULTRAM) 50 MG tablet Take 1 tablet (50 mg total) by mouth every 6 (six) hours as needed. 07/11/14   Charlesetta Shanks, MD  ZETIA 10 MG tablet TAKE 1 TABLET ONCE DAILY. 04/24/16   Peter M Martinique, MD  zolpidem (AMBIEN) 10 MG tablet Take 0.5 tablets (5 mg total) by mouth at bedtime as needed. For sleep 02/04/12   Midge Minium, MD    Family History Family History  Problem Relation Age of Onset  . Heart disease Mother   . Heart disease Sister   . Diabetes Sister   . Arthritis  Father   . Cancer Father   . Crohn's disease Father   . Cancer Paternal Aunt     Social History Social History  Substance Use Topics  . Smoking status: Never Smoker  . Smokeless tobacco: Never Used  . Alcohol use No     Allergies   Demerol; Hydrocodone-acetaminophen; Meperidine hcl; Morphine; Morphine and related; and Vioxx [rofecoxib]   Review of Systems Review of Systems  Constitutional: Negative for chills.  HENT: Positive for congestion. Negative for rhinorrhea and sore throat.   Respiratory: Positive for cough and shortness of breath (worsens with exertion, states even walking from one side of the house to the other is now difficult). Negative for wheezing.   Cardiovascular: Negative for chest pain.  Gastrointestinal: Negative for nausea and vomiting.  Genitourinary: Negative for dysuria and hematuria.  Musculoskeletal: Negative for neck pain and neck stiffness.  Skin: Negative for pallor and rash.  Neurological: Negative for weakness and numbness.  Psychiatric/Behavioral: Negative for agitation and confusion.  All other systems reviewed and are negative.    Physical Exam Updated Vital Signs BP 110/59   Pulse 82   Temp 98.2 F (36.8 C) (Oral)   Resp 25   Ht 5\' 2"  (1.575 m)   Wt 72.6 kg   SpO2 (!) 89%   BMI 29.26 kg/m   Physical Exam  Constitutional: She is oriented to person, place, and time. No distress.  HENT:  Head: Normocephalic and atraumatic.  Eyes: Conjunctivae are normal.  Neck: Neck supple. No JVD present.  Cardiovascular: Normal rate and regular rhythm.   No murmur heard. Pulmonary/Chest: Effort normal. No respiratory distress. She has rales (bilateral).  Abdominal: Soft. There is no tenderness.  Musculoskeletal: Normal range of motion. She exhibits edema (mild bilateral lower extremity pitting edema).  Neurological: She is alert and oriented to person, place, and time.  Skin: Skin is warm and dry. She is not diaphoretic.  Psychiatric: She  has a normal mood and affect.  Nursing note and vitals reviewed.    ED Treatments / Results  Labs (all labs ordered are listed, but only abnormal results are displayed) Labs Reviewed  CBC WITH DIFFERENTIAL/PLATELET - Abnormal; Notable for the following:       Result Value   RBC 5.37 (*)    Hemoglobin 15.2 (*)    HCT 46.4 (*)    Monocytes Absolute 1.3 (*)    All other components within normal limits  BASIC METABOLIC PANEL - Abnormal; Notable for the following:    Glucose, Bld 115 (*)    All other components within normal limits  BRAIN NATRIURETIC PEPTIDE  I-STAT TROPOININ, ED    EKG  EKG Interpretation  Date/Time:  Tuesday May 28 2016 21:41:05 EST Ventricular Rate:  81 PR Interval:    QRS Duration: 144 QT Interval:  403 QTC Calculation: 468 R Axis:   33 Text Interpretation:  Sinus rhythm Left bundle branch block since last tracing no significant change Confirmed by MILLER  MD, BRIAN ([id number]) on 05/28/2016 9:45:21 PM       Radiology No results found.  Procedures Procedures (including critical care time)  Medications Ordered in ED Medications  ipratropium-albuterol (DUONEB) 0.5-2.5 (3) MG/3ML nebulizer solution 3 mL (3 mLs Nebulization Given 05/28/16 1703)     Initial Impression / Assessment and Plan / ED Course  I have reviewed the triage vital signs and the nursing notes.  Pertinent labs & imaging results that were available during my care of the patient were reviewed by me and considered in my medical decision making (see chart for details).  Clinical Course      75 year old female with a past medical history of diabetes, fibromyalgia presents today with worsening shortness of breath over the last 2 weeks. Patient has continued changes on her chest x-ray including findings concerning for fibrotic changes. No formal diagnosis of pulmonary fibrosis in the past. Patient does have periodic hypoxia in the 80s here on room air. She has no supplemental oxygen  requirement at home. Given her worsening dyspnea on exertion we will plan to admit the patient for further workup.  I workup today was notable for a BNP that was within normal limits making this unlikely to be congestive heart failure. She has no fever or elevated white count therefore I doubt that this is related to pneumonia or other infectious etiology.  We discussed the plan for admission with the patient and she is agreeable. Hospitalist agreeable for admission patient in stable condition at the time of admission.  Final Clinical Impressions(s) / ED Diagnoses   Final diagnoses:  Hypoxia    New Prescriptions New Prescriptions   No medications on file     Theodosia Quay, MD 05/28/16 2354    Noemi Chapel, MD 05/29/16 1034

## 2016-05-28 NOTE — ED Provider Notes (Signed)
The patient is a 75 year old female, she has a history of several weeks of worsening cough and shortness of breath with some dyspnea on exertion. She was seen at the family doctor's office today and had an x-ray performed which was read as possible pulmonary edema versus infiltrate. The patient denies fevers, she does have a cough but denies chest pain area and she has no prior history of lung disease, no prior history of heart disease and has never had congestive heart failure and denies any leg swelling. On exam the patient has clear heart sounds, no edema, no JVD. She does have diffuse rales with inspiration and an oxygen saturation of 88% on room air.  I have reviewed the x-ray performed at the office, I believe that this x-ray as over the last several years the x-rays have gotten worse and she has more interstitial markings, more findings that could be consistent with interstitial lung disease, this could also be pneumonia though without a fever or leukocytosis that makes it less likely. She does not have congestive heart failure given the findings of a normal BNP  I saw and evaluated the patient, reviewed the resident's note and I agree with the findings and plan.   EKG Interpretation  Date/Time:  Tuesday May 28 2016 21:41:05 EST Ventricular Rate:  81 PR Interval:    QRS Duration: 144 QT Interval:  403 QTC Calculation: 468 R Axis:   33 Text Interpretation:  Sinus rhythm Left bundle branch block since last tracing no significant change Confirmed by Sabra Heck  MD, Keelyn Fjelstad ([id number]) on 05/28/2016 9:45:21 PM       Final diagnoses:  Hypoxia         Noemi Chapel, MD 05/29/16 [id number]

## 2016-05-28 NOTE — ED Triage Notes (Signed)
Pt sent here from PCP for eval of O2 sats 90% on RA and cough with congestion

## 2016-05-28 NOTE — ED Notes (Signed)
The pt is feeling ok  Now just waiting for word  From the doctor

## 2016-05-28 NOTE — Telephone Encounter (Signed)
Returned call to Dr.Wendy Leonides Schanz she was calling to let Dr.Jordan know she sent patient to [facility] ED for CHF.

## 2016-05-29 ENCOUNTER — Observation Stay (HOSPITAL_COMMUNITY): Payer: Medicare Other

## 2016-05-29 ENCOUNTER — Encounter (HOSPITAL_COMMUNITY): Payer: Self-pay | Admitting: Family Medicine

## 2016-05-29 ENCOUNTER — Observation Stay (HOSPITAL_BASED_OUTPATIENT_CLINIC_OR_DEPARTMENT_OTHER): Payer: Medicare Other

## 2016-05-29 DIAGNOSIS — K769 Liver disease, unspecified: Secondary | ICD-10-CM | POA: Diagnosis present

## 2016-05-29 DIAGNOSIS — E119 Type 2 diabetes mellitus without complications: Secondary | ICD-10-CM | POA: Diagnosis present

## 2016-05-29 DIAGNOSIS — J84114 Acute interstitial pneumonitis: Secondary | ICD-10-CM | POA: Diagnosis present

## 2016-05-29 DIAGNOSIS — I2510 Atherosclerotic heart disease of native coronary artery without angina pectoris: Secondary | ICD-10-CM | POA: Diagnosis present

## 2016-05-29 DIAGNOSIS — D869 Sarcoidosis, unspecified: Secondary | ICD-10-CM | POA: Diagnosis present

## 2016-05-29 DIAGNOSIS — K589 Irritable bowel syndrome without diarrhea: Secondary | ICD-10-CM | POA: Diagnosis present

## 2016-05-29 DIAGNOSIS — M797 Fibromyalgia: Secondary | ICD-10-CM

## 2016-05-29 DIAGNOSIS — Z77120 Contact with and (suspected) exposure to mold (toxic): Secondary | ICD-10-CM | POA: Diagnosis not present

## 2016-05-29 DIAGNOSIS — R0600 Dyspnea, unspecified: Secondary | ICD-10-CM

## 2016-05-29 DIAGNOSIS — I700 Atherosclerosis of aorta: Secondary | ICD-10-CM | POA: Diagnosis present

## 2016-05-29 DIAGNOSIS — J9621 Acute and chronic respiratory failure with hypoxia: Secondary | ICD-10-CM | POA: Diagnosis present

## 2016-05-29 DIAGNOSIS — J9601 Acute respiratory failure with hypoxia: Secondary | ICD-10-CM | POA: Diagnosis not present

## 2016-05-29 DIAGNOSIS — Q249 Congenital malformation of heart, unspecified: Secondary | ICD-10-CM | POA: Diagnosis not present

## 2016-05-29 DIAGNOSIS — R0902 Hypoxemia: Secondary | ICD-10-CM | POA: Diagnosis present

## 2016-05-29 DIAGNOSIS — M1990 Unspecified osteoarthritis, unspecified site: Secondary | ICD-10-CM | POA: Diagnosis present

## 2016-05-29 DIAGNOSIS — R000 Tachycardia, unspecified: Secondary | ICD-10-CM

## 2016-05-29 DIAGNOSIS — K219 Gastro-esophageal reflux disease without esophagitis: Secondary | ICD-10-CM | POA: Diagnosis present

## 2016-05-29 DIAGNOSIS — Z7982 Long term (current) use of aspirin: Secondary | ICD-10-CM | POA: Diagnosis not present

## 2016-05-29 DIAGNOSIS — K449 Diaphragmatic hernia without obstruction or gangrene: Secondary | ICD-10-CM | POA: Diagnosis present

## 2016-05-29 DIAGNOSIS — Z853 Personal history of malignant neoplasm of breast: Secondary | ICD-10-CM | POA: Diagnosis not present

## 2016-05-29 DIAGNOSIS — Z7984 Long term (current) use of oral hypoglycemic drugs: Secondary | ICD-10-CM | POA: Diagnosis not present

## 2016-05-29 DIAGNOSIS — E785 Hyperlipidemia, unspecified: Secondary | ICD-10-CM | POA: Diagnosis present

## 2016-05-29 DIAGNOSIS — Z79899 Other long term (current) drug therapy: Secondary | ICD-10-CM | POA: Diagnosis not present

## 2016-05-29 DIAGNOSIS — Z8249 Family history of ischemic heart disease and other diseases of the circulatory system: Secondary | ICD-10-CM | POA: Diagnosis not present

## 2016-05-29 DIAGNOSIS — I447 Left bundle-branch block, unspecified: Secondary | ICD-10-CM | POA: Diagnosis present

## 2016-05-29 LAB — GLUCOSE, CAPILLARY
GLUCOSE-CAPILLARY: 141 mg/dL — AB (ref 65–99)
Glucose-Capillary: 116 mg/dL — ABNORMAL HIGH (ref 65–99)
Glucose-Capillary: 82 mg/dL (ref 65–99)

## 2016-05-29 LAB — TROPONIN I

## 2016-05-29 LAB — SEDIMENTATION RATE: Sed Rate: 24 mm/hr — ABNORMAL HIGH (ref 0–22)

## 2016-05-29 LAB — ECHOCARDIOGRAM COMPLETE
HEIGHTINCHES: 62.5 in
Weight: 2576 oz

## 2016-05-29 LAB — C-REACTIVE PROTEIN: CRP: 1.9 mg/dL — AB (ref <1.0)

## 2016-05-29 MED ORDER — GLIMEPIRIDE 2 MG PO TABS
2.0000 mg | ORAL_TABLET | Freq: Every day | ORAL | Status: DC
  Administered 2016-05-29 – 2016-05-30 (×2): 2 mg via ORAL
  Filled 2016-05-29 (×2): qty 2

## 2016-05-29 MED ORDER — PANTOPRAZOLE SODIUM 40 MG PO TBEC: 40.0000 mg | DELAYED_RELEASE_TABLET | Freq: Every day | ORAL | Status: DC | PRN

## 2016-05-29 MED ORDER — NADOLOL 20 MG PO TABS
20.0000 mg | ORAL_TABLET | Freq: Every day | ORAL | Status: DC
  Administered 2016-05-29 – 2016-06-02 (×5): 20 mg via ORAL
  Filled 2016-05-29 (×5): qty 1

## 2016-05-29 MED ORDER — ZOLPIDEM TARTRATE 5 MG PO TABS
5.0000 mg | ORAL_TABLET | Freq: Every evening | ORAL | Status: DC | PRN
  Administered 2016-05-29 – 2016-06-01 (×5): 5 mg via ORAL
  Filled 2016-05-29 (×4): qty 1

## 2016-05-29 MED ORDER — DILTIAZEM HCL ER COATED BEADS 180 MG PO CP24
360.0000 mg | ORAL_CAPSULE | Freq: Every day | ORAL | Status: DC
  Administered 2016-05-29 – 2016-06-01 (×4): 360 mg via ORAL
  Filled 2016-05-29 (×5): qty 2

## 2016-05-29 MED ORDER — ROSUVASTATIN CALCIUM 10 MG PO TABS
5.0000 mg | ORAL_TABLET | Freq: Every day | ORAL | Status: DC
  Administered 2016-05-29 – 2016-06-01 (×4): 5 mg via ORAL
  Filled 2016-05-29 (×4): qty 1

## 2016-05-29 MED ORDER — WHITE PETROLATUM GEL
Status: AC
  Administered 2016-05-29: 21:00:00
  Filled 2016-05-29: qty 1

## 2016-05-29 MED ORDER — EZETIMIBE 10 MG PO TABS
10.0000 mg | ORAL_TABLET | Freq: Every day | ORAL | Status: DC
  Administered 2016-05-29 – 2016-06-02 (×5): 10 mg via ORAL
  Filled 2016-05-29 (×5): qty 1

## 2016-05-29 MED ORDER — KETOROLAC TROMETHAMINE 0.5 % OP SOLN
4.0000 [drp] | Freq: Four times a day (QID) | OPHTHALMIC | Status: DC
  Administered 2016-05-29 – 2016-05-30 (×3): 4 [drp] via OPHTHALMIC
  Filled 2016-05-29: qty 3

## 2016-05-29 MED ORDER — BACLOFEN 10 MG PO TABS: 10.0000 mg | ORAL_TABLET | Freq: Two times a day (BID) | ORAL | Status: DC | PRN

## 2016-05-29 MED ORDER — ENOXAPARIN SODIUM 40 MG/0.4ML ~~LOC~~ SOLN
40.0000 mg | SUBCUTANEOUS | Status: DC
Start: 2016-05-29 — End: 2016-05-30
  Administered 2016-05-29: 40 mg via SUBCUTANEOUS
  Filled 2016-05-29: qty 0.4

## 2016-05-29 MED ORDER — DULOXETINE HCL 60 MG PO CPEP
60.0000 mg | ORAL_CAPSULE | Freq: Two times a day (BID) | ORAL | Status: DC
  Administered 2016-05-29 – 2016-06-02 (×9): 60 mg via ORAL
  Filled 2016-05-29 (×9): qty 1

## 2016-05-29 MED ORDER — ASPIRIN EC 81 MG PO TBEC
81.0000 mg | DELAYED_RELEASE_TABLET | ORAL | Status: DC
  Administered 2016-05-29: 81 mg via ORAL
  Filled 2016-05-29: qty 1

## 2016-05-29 MED ORDER — ZOLPIDEM TARTRATE 5 MG PO TABS
5.0000 mg | ORAL_TABLET | Freq: Every evening | ORAL | Status: DC | PRN
  Filled 2016-05-29: qty 1

## 2016-05-29 NOTE — Progress Notes (Signed)
  Echocardiogram 2D Echocardiogram has been performed.  Kristina Schroeder 05/29/2016, 1:18 PM

## 2016-05-29 NOTE — Progress Notes (Signed)
[facility] Progress Note  Patient: Kristina Schroeder [id number]   PCP: Cari Caraway, MD DOB: 1940-09-23   DOA: 05/28/2016   DOS: 05/29/2016   Date of Service: the patient was seen and examined on 05/29/2016  Brief hospital course: Pt. with PMH of Fibromyalgia, LBBB, sinus tachycardia, DM, GERD; admitted on 05/28/2016, with complaint of shortness of breath, was found to have acute hypoxic respiratory failure with findings suspicious for interstitial pneumonitis. Currently further plan is further workup.  Assessment and Plan: 1. Acute hypoxic respiratory failure. Suspected Acute interstitial pneumonitis versus nonspecific interstitial pneumonitis. Patient is hypoxic on room air requiring 3 L of oxygen. Echocardiogram also shows systolic dysfunction but BNP and clinically the patient is euvolemic. With this patient's primary presentation is likely associated with her lung findings and pulmonary consulted. Appreciate input. Current plan is to obtain inflammatory workup and plan for bronchoscopy with BAL. Continue close monitoring at present.  2. Type 2 diabetes mellitus. Placing the patient on sliding scale insulin.  3. Inappropriate sinus or tachycardia.  patient is on beta blocker at home.  will continue to close monitoring.  4. History of GERD. We will continue scheduled PPI.  5. Fibromyalgia. Continue Cymbalta.  Pain management: When necessary Tylenol Activity: No indication for physical therapy Bowel regimen: last BM 05/29/2016 Diet: Cardiac diet DVT Prophylaxis: subcutaneous Heparin  Advance goals of care discussion: Full code  Family Communication: family was present at bedside, at the time of interview. The pt provided permission to discuss medical plan with the family. Opportunity was given to ask question and all questions were answered satisfactorily.   Disposition:  Discharge to home. Expected discharge date: 06/03/2016, improvement in  hypoxia  Consultants: Pulmonary Procedures: Echocardiogram  Antibiotics: Anti-infectives    None        Subjective: Patient continues to complain about some fatigue and tiredness as well as shortness of breath. No chest pain or nausea  Objective: Physical Exam: Vitals:   05/29/16 0230 05/29/16 0458 05/29/16 0922 05/29/16 1631  BP: 131/68 106/67 124/64 134/72  Pulse: 89 93 (!) 103 87  Resp: (!) 21 20 20 18   Temp: 98.3 F (36.8 C) 98.4 F (36.9 C) 98.4 F (36.9 C) 97.9 F (36.6 C)  TempSrc: Oral Oral Oral Oral  SpO2: 95% 98% 93% 97%  Weight: 73 kg (161 lb)     Height: 5' 2.5" (1.588 m)       Intake/Output Summary (Last 24 hours) at 05/29/16 1751 Last data filed at 05/29/16 1515  Gross per 24 hour  Intake              600 ml  Output             2400 ml  Net            -1800 ml   Filed Weights   05/28/16 1652 05/29/16 0230  Weight: 72.6 kg (160 lb) 73 kg (161 lb)    General: Alert, Awake and Oriented to Time, Place and Person. Appear in mild distress, affect appropriate Eyes: PERRL, Conjunctiva normal ENT: Oral Mucosa clear moist. Neck: no JVD, no Abnormal Mass Or lumps Cardiovascular: S1 and S2 Present, no Murmur, Respiratory: Bilateral Air entry equal and Decreased, no use of accessory muscle, bilateral Crackles, no wheezes Abdomen: Bowel Sound present, Soft and no tenderness Skin: no redness, no Rash, no induration Extremities: no Pedal edema, no calf tenderness Neurologic: Grossly no focal neuro deficit. Bilaterally Equal motor strength  Data Reviewed: CBC:  Recent Labs Lab  05/28/16 1658  WBC 10.5  NEUTROABS 7.1  HGB 15.2*  HCT 46.4*  MCV 86.4  PLT 123456   Basic Metabolic Panel:  Recent Labs Lab 05/28/16 1658  NA 136  K 4.4  CL 103  CO2 23  GLUCOSE 115*  BUN 9  CREATININE 0.68  CALCIUM 9.5    Liver Function Tests: No results for input(s): AST, ALT, ALKPHOS, BILITOT, PROT, ALBUMIN in the last 168 hours. No results for input(s): LIPASE,  AMYLASE in the last 168 hours. No results for input(s): AMMONIA in the last 168 hours. Coagulation Profile: No results for input(s): INR, PROTIME in the last 168 hours. Cardiac Enzymes:  Recent Labs Lab 05/29/16 0812 05/29/16 1340  TROPONINI <0.03 <0.03   BNP (last 3 results) No results for input(s): PROBNP in the last 8760 hours.  CBG:  Recent Labs Lab 05/29/16 0228 05/29/16 0716 05/29/16 1625  GLUCAP 116* 141* 82    Studies: Ct Chest High Resolution  Result Date: 05/29/2016 CLINICAL DATA:  75 year old female with history of cough for 3 weeks. Hypoxia. EXAM: CT CHEST WITHOUT CONTRAST TECHNIQUE: Multidetector CT imaging of the chest was performed following the standard protocol without intravenous contrast. High resolution imaging of the lungs, as well as inspiratory and expiratory imaging, was performed. COMPARISON:  Chest CT 08/12/2006. FINDINGS: Cardiovascular: Heart size is mildly enlarged. There is no significant pericardial fluid, thickening or pericardial calcification. There is aortic atherosclerosis, as well as atherosclerosis of the great vessels of the mediastinum and the coronary arteries, including calcified atherosclerotic plaque in the left main, left anterior descending and right coronary arteries. Dilatation of the pulmonic trunk (3.7 cm in diameter), concerning for pulmonary arterial hypertension. Mediastinum/Nodes: There are multiple borderline enlarged and mildly enlarged mediastinal and bilateral hilar lymph nodes measuring up to 12 mm in short axis in the low right paratracheal nodal station. Some partially calcified mediastinal and right hilar lymph nodes are also noted. Small hiatal hernia. No axillary lymphadenopathy. Lungs/Pleura: There are widespread areas of both ground-glass attenuation and frank airspace consolidation scattered randomly throughout the lungs bilaterally. The distribution of this airspace disease is remarkable for no definite craniocaudal  gradient. Minimal associated interlobular septal thickening, predominantly in the lung apices. In some affected regions there is associated cylindrical and varicose bronchiectasis, most evident in the left upper lobe. Given the multifocal airspace consolidation, accurate assessment for interstitial lung disease on today's examination is challenging. With these limitations in mind, high-resolution images do demonstrate some areas of basal predominant subpleural reticulation with what appears to be some peripheral bronchiolectasis. No definite honeycombing is confidently identified. Inspiratory and expiratory imaging demonstrates mild air trapping, indicative of mild small airways disease. No pleural effusions. Upper Abdomen: In the central aspect of segment 4A of the liver there is a well-defined 1.3 x 2.1 cm low-attenuation lesion which is slightly larger than remote prior study 08/12/2006, but favored to represent a cyst. In addition, however, there multiple other ill-defined intermediate attenuation lesions scattered throughout the hepatic parenchyma which are indeterminate but concerning for potential metastatic disease. The largest of these is in the superior aspect of segment 7 estimated to measure approximately 3.1 x 1.9 cm (axial image 110 of series 4), new compared to the prior examination. Musculoskeletal: There are no aggressive appearing lytic or blastic lesions noted in the visualized portions of the skeleton. IMPRESSION: 1. Highly unusual appearance of the lungs. While there are some basal predominant findings which are suggestive of underlying interstitial lung disease, such as nonspecific interstitial pneumonia (  NSIP) or early usual interstitial pneumonia (UIP), the predominant finding is that of multifocal randomly distributed airspace consolidation which is a mixture of ground-glass attenuation and more densely consolidated lung. This is favored to represent a separate process, with differential  considerations including acute atypical infectious etiologies, including fungal pneumonia or other atypical organisms, eosinophilic pneumonia, acute interstitial pneumonia (AIP), or potentially even multifocal neoplasm such as primary bronchogenic adenocarcinoma. Given the mediastinal and hilar lymphadenopathy, the possibility of an unusual manifestation of alveolar sarcoid is also considered to account for the lung findings, but not favored. Correlation with bronchoscopy is suggested. 2. In addition, there are multifocal ill-defined intermediate attenuation liver lesions which are indeterminate and concerning for potential metastatic disease. This could be further evaluated with nonemergent MRI of the abdomen with and without IV gadolinium in the near future. 3. Aortic atherosclerosis, in addition to left main and 2 vessel coronary artery disease. Assessment for potential risk factor modification, dietary therapy or pharmacologic therapy may be warranted, if clinically indicated. 4. Mild cardiomegaly. 5. Dilatation of the pulmonic trunk (3.7 cm in diameter), concerning for potential pulmonary arterial hypertension. Electronically Signed   By: Vinnie Langton M.D.   On: 05/29/2016 07:48     Scheduled Meds: . aspirin EC  81 mg Oral QODAY  . diltiazem  360 mg Oral Daily  . DULoxetine  60 mg Oral BID  . enoxaparin (LOVENOX) injection  40 mg Subcutaneous Q24H  . ezetimibe  10 mg Oral Daily  . glimepiride  2-4 mg Oral Q breakfast  . ketorolac  4 drop Left Eye QID  . nadolol  20 mg Oral Daily  . rosuvastatin  5 mg Oral QHS   Continuous Infusions: PRN Meds: baclofen, pantoprazole, zolpidem  Time spent: 30 minutes  Author: Berle Mull, MD [facility] Pager: [phone] 05/29/2016 5:51 PM  If 7PM-7AM, please contact night-coverage at [email or web address], password [facility]

## 2016-05-29 NOTE — ED Notes (Signed)
Pt asking how long before they  Would get a bed

## 2016-05-29 NOTE — ED Notes (Signed)
Report called to rn on 6e 

## 2016-05-29 NOTE — ED Notes (Signed)
The pts husband went home.  Pt up to the br tolerated well  Coughing occasionally non-productive

## 2016-05-29 NOTE — H&P (Signed)
History and Physical  Patient Name: Kristina Schroeder     [id number]    DOB: [birth year]    DOA: 05/28/2016 PCP: Cari Caraway, MD   Patient coming from: Home ---> PCP office ---> ER  Chief Complaint: Dyspnea with exertion  HPI: [name] is a 75 y.o. 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  The patient was in her usual state of health until about 3 or 4 weeks ago when she started to notice insidious onset of shortness of breath with exertion, relieved with rest. This was not associated with chest pain, nor with leg swelling, orthopnea, or PND.  There was a new cough, productive of morning yellow/green sputum, but no fever, chills, hemoptysis, malaise.  It persisted and worsened until this last Friday 12/15, she was at [facility] for family members graduation, and could barely make it from the car to the auditorium. Today she saw her PCP, who noted rales on exam, obtained a chest x-ray that showed bilateral interstitial markings, and sent her to the emergency room.  ED course: -Afebrile, heart rate 86, respirations 18-28, oximetry high 80s on room air, blood pressure 111/71 -Na 136, K 4.4, Cr 0.68, WBC 10.5K, Hgb 15.2 -BNP normal, Troponin negative -No clinical signs of fluid overload or infection -ECG showed old LBBB and TRH were asked to work up for new hypoxia and abnormal chest x-ray    The patient is retired, worked in an Proofreader. She has no previous lung disease, is not a smoker.  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  No family history of lung disease.  She has a remote history of abnormal sinus tachycardia, treated with Cardizem.  Actually appears to have had an LHC in 2010 that showed no obstructive disease but decreased EF 40%, and a stress afterwards showed some reversible ischemia.  She has never  used a diuretic nor taken amiodarone, nor had episodes of CHF.       ROS: Review of Systems  Constitutional: Negative for chills, fever, malaise/fatigue and weight loss.  HENT: Negative for congestion.   Respiratory: Positive for cough, sputum production (in AM) and shortness of breath. Negative for hemoptysis and wheezing.   Cardiovascular: Negative for chest pain, orthopnea, leg swelling and PND.  Musculoskeletal: Negative for joint pain.  Skin: Negative for rash.  All other systems reviewed and are negative.         Past Medical History:  Diagnosis Date  . Allergy   . Bronchitis    acute  . Bundle branch block left   . Cancer [facility])    Breast  . 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 Degenerative joint disease   . Dyslipidemia   . Fibromyalgia   . Gastritis   . GERD (gastroesophageal reflux disease)   . Hyperlipidemia   . IBS (irritable bowel syndrome)   . Left ventricular dysfunction   . Shingles     Past Surgical History:  Procedure Laterality Date  . BREAST SURGERY  12 6 2003  . CARDIAC CATHETERIZATION  08/2008  . CATARACT EXTRACTION  4 2013  . GANGLION CYST EXCISION    . MASTECTOMY, PARTIAL     Status post left   . TONSILLECTOMY    . TONSILLECTOMY  1963    Social History: Patient lives with her husband.  The patient walks  unassisted.  She is not a smoker.  Worked in Press [employer].    Allergies  Allergen Reactions  . Demerol     unknown  . Hydrocodone-Acetaminophen     unknown  . Meperidine Hcl     unknown  . Morphine     unknown  . Morphine And Related     unknown  . Vioxx [Rofecoxib]     unknown    Family history: family history includes Arthritis in her father; Cancer in her father and paternal aunt; Crohn's disease in her father; Diabetes in her sister; Heart disease in her mother and sister.  Prior to Admission medications   Medication Sig Start Date End Date Taking? Authorizing Provider  baclofen (LIORESAL) 10 MG tablet  Take 10 mg by mouth 2 (two) times daily.  01/05/14   Historical Provider, MD  BIOTIN 5000 PO Take 5,000 mg by mouth daily.     Historical Provider, MD  cholecalciferol (VITAMIN D) 1000 UNITS tablet Take 2,000 Units by mouth daily.      Historical Provider, MD  CRESTOR 5 MG tablet TAKE ONE TABLET AT BEDTIME. 04/24/16   Peter M Martinique, MD  diltiazem (CARDIZEM CD) 180 MG 24 hr capsule Take 2 capsules (360 mg total) by mouth daily. 05/20/16   Peter M Martinique, MD  DULoxetine (CYMBALTA) 60 MG capsule Take 1 capsule (60 mg total) by mouth 2 (two) times daily. 02/04/12   Midge Minium, MD  glimepiride (AMARYL) 2 MG tablet Take 2 mg by mouth 2 (two) times daily. 05/22/16   Historical Provider, MD  GuaiFENesin (MUCINEX PO) Take 1,200 mg by mouth daily as needed.     Historical Provider, MD  ibuprofen (ADVIL,MOTRIN) 600 MG tablet Take 1 tablet (600 mg total) by mouth every 6 (six) hours as needed. 07/11/14   Charlesetta Shanks, MD  metFORMIN (GLUCOPHAGE) 500 MG tablet Take 1,000 mg by mouth at bedtime.    Historical Provider, MD  nadolol (CORGARD) 20 MG tablet Take 1 tablet (20 mg total) by mouth daily. 12/25/15   Peter M Martinique, MD  pantoprazole (PROTONIX) 40 MG tablet Take 40 mg by mouth daily.      Historical Provider, MD  traMADol (ULTRAM) 50 MG tablet Take 1 tablet (50 mg total) by mouth every 6 (six) hours as needed. 07/11/14   Charlesetta Shanks, MD  ZETIA 10 MG tablet TAKE 1 TABLET ONCE DAILY. 04/24/16   Peter M Martinique, MD  zolpidem (AMBIEN) 10 MG tablet Take 0.5 tablets (5 mg total) by mouth at bedtime as needed. For sleep 02/04/12   Midge Minium, MD       Physical Exam: BP 110/59   Pulse 82   Temp 98.2 F (36.8 C) (Oral)   Resp 25   Ht 5\' 2"  (1.575 m)   Wt 72.6 kg (160 lb)   SpO2 (!) 89%   BMI 29.26 kg/m  General appearance: Well-developed, elderly adult female, alert and in no acute distress.   Eyes: Anicteric, conjunctiva pink, lids and lashes normal. PERRL.    ENT: No nasal deformity,  discharge, epistaxis.  Hearing normal. OP moist without lesions.   Neck: No neck masses.  Trachea midline.  No thyromegaly/tenderness. Lymph: No cervical or supraclavicular lymphadenopathy. Skin: Warm and dry.  No jaundice.  No suspicious rashes or lesions. Cardiac: RRR, nl S1-S2, no murmurs appreciated.  Capillary refill is brisk.  JVP normal.  No LE edema.  Radial and DP pulses 2+ and symmetric. Respiratory: Normal respiratory rate and  rhythm.  Fine rales in all fields. Abdomen: Abdomen soft.  No TTP. No ascites, distension, hepatosplenomegaly.   MSK: No deformities or effusions.  No cyanosis or clubbing. Neuro: Cranial nerves 3-12 intact.  Sensation intact to light touch. Speech is fluent.  Muscle strength normal.    Psych: Sensorium intact and responding to questions, attention normal.  Behavior appropriate.  Affect normal.  Judgment and insight appear normal.     Labs on Admission:  I have personally reviewed following labs and imaging studies: CBC:  Recent Labs Lab 05/28/16 1658  WBC 10.5  NEUTROABS 7.1  HGB 15.2*  HCT 46.4*  MCV 86.4  PLT 123456   Basic Metabolic Panel:  Recent Labs Lab 05/28/16 1658  NA 136  K 4.4  CL 103  CO2 23  GLUCOSE 115*  BUN 9  CREATININE 0.68  CALCIUM 9.5   GFR: Estimated Creatinine Clearance: 56.7 mL/min (by C-G formula based on SCr of 0.68 mg/dL).  Liver Function Tests: No results for input(s): AST, ALT, ALKPHOS, BILITOT, PROT, ALBUMIN in the last 168 hours. No results for input(s): LIPASE, AMYLASE in the last 168 hours. No results for input(s): AMMONIA in the last 168 hours. Coagulation Profile: No results for input(s): INR, PROTIME in the last 168 hours. Cardiac Enzymes: No results for input(s): CKTOTAL, CKMB, CKMBINDEX, TROPONINI in the last 168 hours. BNP (last 3 results) No results for input(s): PROBNP in the last 8760 hours. HbA1C: No results for input(s): HGBA1C in the last 72 hours. CBG: No results for input(s): GLUCAP  in the last 168 hours. Lipid Profile: No results for input(s): CHOL, HDL, LDLCALC, TRIG, CHOLHDL, LDLDIRECT in the last 72 hours. Thyroid Function Tests: No results for input(s): TSH, T4TOTAL, FREET4, T3FREE, THYROIDAB in the last 72 hours. Anemia Panel: No results for input(s): VITAMINB12, FOLATE, FERRITIN, TIBC, IRON, RETICCTPCT in the last 72 hours. Sepsis Labs: Invalid input(s): PROCALCITONIN, LACTICIDVEN No results found for this or any previous visit (from the past 240 hour(s)).       Radiological Exams on Admission: Personally reviewed CXR shows bilateral streaky opacities.   EKG: Independently reviewed. Rate 81, QTc 468.  LBBB, no change from previous.       Assessment/Plan  1. Hypoxia and abnormal chest x-ray:  The differential includes interstitial lung disease or CHF.  Infectious pneumonia doubted given chronicity, lack of WBC, fever.     -High res CT ordered -Echocardiogram ordered -Consult Pulmonology, appreciate cares  2. Inappropriate sinus node tachycardia:  -Continue diltiazem  3. Fibromyalgia:  -Continue duloxetine  4. NIDDM:  -Continue glimepiride  5. Sinus tachycardia and LV dysfunction: Followed by Dr. Martinique.  Last Echo not in our system.  EF reportedly 45-50%, no CHF symptoms ever.   -Continue diltiazem and nadolol -Continue aspirin 81 mg daily and statin and Zetia  6. Other medications: -Continue Ambien, PRN -Continue PPI -Continue Acular drops       DVT prophylaxis: Lovenox  Code Status: FULL  Family Communication: Husband at bedside  Disposition Plan: Anticipate HRCT chest and echo and consult to Pulmonology.  Consults called: None overnight Admission status: OBS At the point of initial evaluation, it is my clinical opinion that admission for OBSERVATION is reasonable and necessary because the patient's presenting complaints in the context of their chronic conditions represent sufficient risk of deterioration or significant  morbidity to constitute reasonable grounds for close observation in the hospital setting, but that the patient may be medically stable for discharge from the hospital within 24 to  48 hours.    Medical decision making: Patient seen at 11:45 PM on 05/28/2016.  The patient was discussed with Dr. Sabra Heck.  What exists of the patient's chart was reviewed in depth and summarized above.  Clinical condition: stable.        Edwin Dada [facility] Pager [phone]

## 2016-05-29 NOTE — Consult Note (Signed)
 Name: Kristina Schroeder MRN: [id number] DOB: 08/09/1940    ADMISSION DATE:  05/28/2016 CONSULTATION DATE:  12/20  REFERRING MD :  Patel ([facility])   CHIEF COMPLAINT:  hypoxia  BRIEF PATIENT DESCRIPTION:  75yo female never smoker with hx fibromyalgia, chronic LBBB, sinus tachycardia regulated with B blocker and diltiazem, DM, GERD presented 12/20 with 2-3 weeks of progressive SOB, much worse with exertion, chest congestion, fatigue and general malaise.  In ER pt was afebrile, mildly hypoxic with sats 88% on RA. WBC, troponin, BNP all wnl.   Admitted by [facility] with hypoxia and abnormal CXR and PCCM consulted.   SIGNIFICANT EVENTS    STUDIES:  CT chest 12/20>>> 1. Highly unusual appearance of the lungs. While there are some basal predominant findings which are suggestive of underlying interstitial lung disease, such as nonspecific interstitial pneumonia (NSIP) or early usual interstitial pneumonia (UIP), the predominant finding is that of multifocal randomly distributed airspace consolidation which is a mixture of ground-glass attenuation and more densely consolidated lung. This is favored to represent a separate process, with differential considerations including acute atypical infectious etiologies, including fungal pneumonia or other atypical organisms, eosinophilic pneumonia, acute interstitial pneumonia (AIP), or potentially even multifocal neoplasm such as primary bronchogenic adenocarcinoma. Given the mediastinal and hilar lymphadenopathy, the possibility of an unusual manifestation of alveolar sarcoid is also considered to account for the lung findings, but not favored. Correlation with bronchoscopy is suggested. 2. In addition, there are multifocal ill-defined intermediate attenuation liver lesions which are indeterminate and concerning for potential metastatic disease. This could be further evaluated with nonemergent MRI of the abdomen with and without IV gadolinium in the near  future. 3. Aortic atherosclerosis, in addition to left main and 2 vessel coronary artery disease. Assessment for potential risk factor modification, dietary therapy or pharmacologic therapy may be warranted, if clinically indicated. 4. Mild cardiomegaly. 5. Dilatation of the pulmonic trunk (3.7 cm in diameter), concerning for potential pulmonary arterial hypertension.   HISTORY OF PRESENT ILLNESS:  75yo female with hx fibromyalgia, chronic LBBB, sinus tachycardia regulated with B blocker and diltiazem, DM, GERD presented 12/20 with 2-3 weeks of progressive SOB, much worse with exertion, chest congestion, fatigue and general malaise.  In ER pt was afebrile, mildly hypoxic with sats 88% on RA. WBC, troponin, BNP all wnl.   Admitted by [facility] with hypoxia and abnormal CXR and PCCM consulted.   Pt denies hemoptysis, purulent sputum, fever, orthopnea, PND, edema, weight gain.   She is a retired accountant.    Family hx - no known hx lung disease, RA, lupus, autoimmune  Occupation - accountant  Exposures - always lived in [place], no recent travel, new home  Smoke  - never smoker but father and husband both smoked heavily in the house  hx breast cancer 2003 with radiation and oral chemo.   Does not take flu shot.  Did have pneumovax.   PAST MEDICAL HISTORY :   has a past medical history of Allergy; Bronchitis; Bundle branch block left; Cancer (HCC); Cardiac arrhythmia due to congenital heart disease; Degenerative joint disease; Dyslipidemia; Fibromyalgia; Gastritis; GERD (gastroesophageal reflux disease); Hyperlipidemia; IBS (irritable bowel syndrome); Left ventricular dysfunction; and Shingles.  has a past surgical history that includes Cardiac catheterization (08/2008); Mastectomy, partial; Tonsillectomy; Ganglion cyst excision; Cataract extraction (4 2013); Breast surgery (12 6 2003); and Tonsillectomy (1963). Prior to Admission medications   Medication Sig Start Date End Date Taking? Authorizing  Provider  acetaminophen (TYLENOL) 500 MG tablet Take 500-1,000 mg   by mouth every 6 (six) hours as needed for headache (or pain).   Yes Historical Provider, MD  aspirin EC 81 MG tablet Take 81 mg by mouth every other day.   Yes Historical Provider, MD  baclofen (LIORESAL) 10 MG tablet Take 10 mg by mouth 2 (two) times daily as needed for muscle spasms.  01/05/14  Yes Historical Provider, MD  BIOTIN 5000 PO Take 5,000 mg by mouth daily.    Yes Historical Provider, MD  cetirizine (ZYRTEC) 10 MG tablet Take 10 mg by mouth daily.   Yes Historical Provider, MD  cholecalciferol (VITAMIN D) 1000 UNITS tablet Take 2,000 Units by mouth daily.     Yes Historical Provider, MD  CRESTOR 5 MG tablet TAKE ONE TABLET AT BEDTIME. Patient taking differently: Take 5 mg by mouth at bedtime 04/24/16  Yes Peter M Jordan, MD  diltiazem (CARDIZEM CD) 180 MG 24 hr capsule Take 2 capsules (360 mg total) by mouth daily. 05/20/16  Yes Peter M Jordan, MD  DULoxetine (CYMBALTA) 60 MG capsule Take 1 capsule (60 mg total) by mouth 2 (two) times daily. 02/04/12  Yes Katherine E Tabori, MD  glimepiride (AMARYL) 2 MG tablet Take 2 mg by mouth 2 (two) times daily. 05/22/16  Yes Historical Provider, MD  ibuprofen (ADVIL,MOTRIN) 600 MG tablet Take 1 tablet (600 mg total) by mouth every 6 (six) hours as needed. Patient taking differently: Take 600 mg by mouth every 6 (six) hours as needed for headache (or pain).  07/11/14  Yes Marcy Pfeiffer, MD  ketorolac (ACULAR) 0.5 % ophthalmic solution Place 4 drops into the left eye 4 (four) times daily. Stop date: 05/31/16 05/06/16  Yes Historical Provider, MD  nadolol (CORGARD) 20 MG tablet Take 1 tablet (20 mg total) by mouth daily. 12/25/15  Yes Peter M Jordan, MD  ofloxacin (OCUFLOX) 0.3 % ophthalmic solution Place 4 drops into the left eye 4 (four) times daily. Stop date: 05/31/16 05/06/16  Yes Historical Provider, MD  pantoprazole (PROTONIX) 40 MG tablet Take 40 mg by mouth daily as needed (for  reflux).    Yes Historical Provider, MD  traMADol (ULTRAM) 50 MG tablet Take 1 tablet (50 mg total) by mouth every 6 (six) hours as needed. Patient taking differently: Take 50 mg by mouth every 6 (six) hours as needed (for pain).  07/11/14  Yes Marcy Pfeiffer, MD  Wheat Dextrin (BENEFIBER) POWD Take by mouth See admin instructions. Mix 3 teaspoonsful into juice and drink two times a day   Yes Historical Provider, MD  ZETIA 10 MG tablet TAKE 1 TABLET ONCE DAILY. Patient taking differently: Take 10 mg by mouth once a day 04/24/16  Yes Peter M Jordan, MD  zolpidem (AMBIEN) 10 MG tablet Take 0.5 tablets (5 mg total) by mouth at bedtime as needed. For sleep Patient taking differently: Take 5 mg by mouth at bedtime as needed for sleep.  02/04/12  Yes Katherine E Tabori, MD  GuaiFENesin (MUCINEX PO) Take 1,200 mg by mouth daily as needed.     Historical Provider, MD   Allergies  Allergen Reactions  . Demerol Nausea And Vomiting       . Hydrocodone-Acetaminophen Nausea And Vomiting       . Meperidine Hcl Nausea And Vomiting       . Morphine Nausea And Vomiting        . Morphine And Related Nausea And Vomiting        . Vioxx [Rofecoxib] Nausea And Vomiting             FAMILY HISTORY:  family history includes Arthritis in her father; Cancer in her father and paternal aunt; Crohn's disease in her father; Diabetes in her sister; Heart disease in her mother and sister. SOCIAL HISTORY:  reports that she has never smoked. She has never used smokeless tobacco. She reports that she does not drink alcohol.  REVIEW OF SYSTEMS:   As per HPI - All other systems reviewed and were neg.    SUBJECTIVE:   VITAL SIGNS: Temp:  [98.2 F (36.8 C)-98.4 F (36.9 C)] 98.4 F (36.9 C) (12/20 0922) Pulse Rate:  [82-103] 103 (12/20 0922) Resp:  [17-28] 20 (12/20 0922) BP: (100-131)/(54-85) 124/64 (12/20 0922) SpO2:  [89 %-98 %] 93 % (12/20 0922) Weight:  [72.6 kg (160 lb)-73 kg (161 lb)] 73 kg (161 lb) (12/20  0230)  PHYSICAL EXAMINATION: General:  Pleasant, talkative female, NAD  Neuro:  Awake, alert, appropriate  HEENT:  Mm moist, no JVD  Cardiovascular:  s1s2  Lungs:  resps even non labored on 1L [place], crackles throughout, L>R  Abdomen:  Round, soft, non tender  Musculoskeletal:  Warm and dry, no edema   Recent Labs Lab 05/28/16 1658  NA 136  K 4.4  CL 103  CO2 23  BUN 9  CREATININE 0.68  GLUCOSE 115*    Recent Labs Lab 05/28/16 1658  HGB 15.2*  HCT 46.4*  WBC 10.5  PLT 292   Ct Chest High Resolution  Result Date: 05/29/2016 CLINICAL DATA:  75-year-old female with history of cough for 3 weeks. Hypoxia. EXAM: CT CHEST WITHOUT CONTRAST TECHNIQUE: Multidetector CT imaging of the chest was performed following the standard protocol without intravenous contrast. High resolution imaging of the lungs, as well as inspiratory and expiratory imaging, was performed. COMPARISON:  Chest CT 08/12/2006. FINDINGS: Cardiovascular: Heart size is mildly enlarged. There is no significant pericardial fluid, thickening or pericardial calcification. There is aortic atherosclerosis, as well as atherosclerosis of the great vessels of the mediastinum and the coronary arteries, including calcified atherosclerotic plaque in the left main, left anterior descending and right coronary arteries. Dilatation of the pulmonic trunk (3.7 cm in diameter), concerning for pulmonary arterial hypertension. Mediastinum/Nodes: There are multiple borderline enlarged and mildly enlarged mediastinal and bilateral hilar lymph nodes measuring up to 12 mm in short axis in the low right paratracheal nodal station. Some partially calcified mediastinal and right hilar lymph nodes are also noted. Small hiatal hernia. No axillary lymphadenopathy. Lungs/Pleura: There are widespread areas of both ground-glass attenuation and frank airspace consolidation scattered randomly throughout the lungs bilaterally. The distribution of this airspace  disease is remarkable for no definite craniocaudal gradient. Minimal associated interlobular septal thickening, predominantly in the lung apices. In some affected regions there is associated cylindrical and varicose bronchiectasis, most evident in the left upper lobe. Given the multifocal airspace consolidation, accurate assessment for interstitial lung disease on today's examination is challenging. With these limitations in mind, high-resolution images do demonstrate some areas of basal predominant subpleural reticulation with what appears to be some peripheral bronchiolectasis. No definite honeycombing is confidently identified. Inspiratory and expiratory imaging demonstrates mild air trapping, indicative of mild small airways disease. No pleural effusions. Upper Abdomen: In the central aspect of segment 4A of the liver there is a well-defined 1.3 x 2.1 cm low-attenuation lesion which is slightly larger than remote prior study 08/12/2006, but favored to represent a cyst. In addition, however, there multiple other ill-defined intermediate attenuation lesions scattered throughout the hepatic parenchyma which are indeterminate but concerning for   potential metastatic disease. The largest of these is in the superior aspect of segment 7 estimated to measure approximately 3.1 x 1.9 cm (axial image 110 of series 4), new compared to the prior examination. Musculoskeletal: There are no aggressive appearing lytic or blastic lesions noted in the visualized portions of the skeleton. IMPRESSION: 1. Highly unusual appearance of the lungs. While there are some basal predominant findings which are suggestive of underlying interstitial lung disease, such as nonspecific interstitial pneumonia (NSIP) or early usual interstitial pneumonia (UIP), the predominant finding is that of multifocal randomly distributed airspace consolidation which is a mixture of ground-glass attenuation and more densely consolidated lung. This is favored to  represent a separate process, with differential considerations including acute atypical infectious etiologies, including fungal pneumonia or other atypical organisms, eosinophilic pneumonia, acute interstitial pneumonia (AIP), or potentially even multifocal neoplasm such as primary bronchogenic adenocarcinoma. Given the mediastinal and hilar lymphadenopathy, the possibility of an unusual manifestation of alveolar sarcoid is also considered to account for the lung findings, but not favored. Correlation with bronchoscopy is suggested. 2. In addition, there are multifocal ill-defined intermediate attenuation liver lesions which are indeterminate and concerning for potential metastatic disease. This could be further evaluated with nonemergent MRI of the abdomen with and without IV gadolinium in the near future. 3. Aortic atherosclerosis, in addition to left main and 2 vessel coronary artery disease. Assessment for potential risk factor modification, dietary therapy or pharmacologic therapy may be warranted, if clinically indicated. 4. Mild cardiomegaly. 5. Dilatation of the pulmonic trunk (3.7 cm in diameter), concerning for potential pulmonary arterial hypertension. Electronically Signed   By: Daniel  Entrikin M.D.   On: 05/29/2016 07:48    ASSESSMENT / PLAN:  Abnormal CT -- Broad differential.  Interstitial infiltrates evident on previous CXR since 2014-2016. Some dense consolidation with air bronchograms as well as extensive ground glass opacities and mediastinal and hilar lymphadenopagthy. Also notable for liver lesions.  Non contributory history other than second-hand smoke exposure.  Does have hx breast cancer with xrt and oral chemo but more than 10years ago. NO significant eosinophils on differential.    Hypoxia   REC -  -autoimmune w/u -- ANA, ANCA, RF, ESR, anti-SCL, anti-GBM, c3, c4, CRP, ACE level   -Sputum culture if able for now - gram stain, fungal cultures  -Consider FOB with BAL for gram  stain, cultures, fungal smear, eosinophils and consider transbronchial biopsy to look for granulomas to r/o sarcoid  - if no compelling results consider VATs for open lung bx   Dr Feinstein to see   Katy Whiteheart, NP 05/29/2016  3:49 PM Pager: [phone] or [phone]  STAFF NOTE: I, Daniel Feinstein, MD FACP have personally reviewed patient's available data, including medical history, events of note, physical examination and test results as part of my evaluation. I have discussed with resident/NP and other care providers such as pharmacist, RN and RRT. In addition, I personally evaluated patient and elicited key findings of: awake, alert, on nasal cannula O2, coarse crackles dry apical anterior and bases, pcxr in 2012/2014 with int changes, NON smoker, CT now with extensive infiltrates , even some bronchogram's/ ground glass also with liver lesions, CT looks like some acute on chronic "burn out", differential with likely Noninfectious pneumonitis, sarcoid, granulomatous dz etc, if eosinophil would have expected some exacerbations over last 2 years, plan is auto immune panel, ACEI level, if nothing overhwleming, would proceed with Bronch BAL, BX as early as Friday, if not answers for etiology   then for open lung bx, would NOT give empiric steroids for now, unless declines, I updated husband and pt in full at bedside  Lavon Paganini. Titus Mould, MD, [place] Pgr: [facility] 05/29/2016 4:54 PM

## 2016-05-29 NOTE — Progress Notes (Signed)
SATURATION QUALIFICATIONS: (This note is used to comply with regulatory documentation for home oxygen)  Patient Saturations on Room Air at Rest = 90%  Patient Saturations on Room Air while Ambulating = 82%  Patient Saturations on 3 Liters of oxygen while Ambulating = 93%  Please briefly explain why patient needs home oxygen: to maintain O2 Sat above 90%

## 2016-05-30 LAB — ANTI-DNA ANTIBODY, DOUBLE-STRANDED: ds DNA Ab: 1 IU/mL (ref 0–9)

## 2016-05-30 LAB — GLUCOSE, CAPILLARY
GLUCOSE-CAPILLARY: 140 mg/dL — AB (ref 65–99)
GLUCOSE-CAPILLARY: 116 mg/dL — AB (ref 65–99)
Glucose-Capillary: 117 mg/dL — ABNORMAL HIGH (ref 65–99)
Glucose-Capillary: 147 mg/dL — ABNORMAL HIGH (ref 65–99)

## 2016-05-30 LAB — ANCA TITERS: C-ANCA: <1:20 {titer}

## 2016-05-30 LAB — ANGIOTENSIN CONVERTING ENZYME: Angiotensin-Converting Enzyme: 56 U/L (ref 14–82)

## 2016-05-30 LAB — ANTI-SCLERODERMA ANTIBODY: SCLERODERMA (SCL-70) (ENA) ANTIBODY, IGG: 0.3 AI (ref 0.0–0.9)

## 2016-05-30 LAB — C4 COMPLEMENT: COMPLEMENT C4, BODY FLUID: 36 mg/dL (ref 14–44)

## 2016-05-30 LAB — C3 COMPLEMENT: C3 Complement: 192 mg/dL — ABNORMAL HIGH (ref 82–167)

## 2016-05-30 LAB — ANTINUCLEAR ANTIBODIES, IFA: ANTINUCLEAR ANTIBODIES, IFA: NEGATIVE

## 2016-05-30 LAB — GLOMERULAR BASEMENT MEMBRANE ANTIBODIES: GBM AB: 4 U (ref 0–20)

## 2016-05-30 MED ORDER — KETOROLAC TROMETHAMINE 0.5 % OP SOLN
1.0000 [drp] | Freq: Four times a day (QID) | OPHTHALMIC | Status: DC
  Administered 2016-05-30 – 2016-06-02 (×11): 1 [drp] via OPHTHALMIC

## 2016-05-30 MED ORDER — GLIMEPIRIDE 2 MG PO TABS
2.0000 mg | ORAL_TABLET | Freq: Every day | ORAL | Status: DC
  Filled 2016-05-30: qty 1

## 2016-05-30 MED ORDER — INSULIN ASPART 100 UNIT/ML ~~LOC~~ SOLN: 0.0000 [IU] | Freq: Every day | SUBCUTANEOUS | Status: DC

## 2016-05-30 MED ORDER — INSULIN ASPART 100 UNIT/ML ~~LOC~~ SOLN: 0.0000 [IU] | Freq: Three times a day (TID) | SUBCUTANEOUS | Status: DC

## 2016-05-30 NOTE — Progress Notes (Signed)
[facility] Progress Note  Patient: Kristina Schroeder [id number]   PCP: Cari Caraway, MD DOB: December 09, 1940   DOA: 05/28/2016   DOS: 05/30/2016   Date of Service: the patient was seen and examined on 05/30/2016  Brief hospital course: Pt. with PMH of Fibromyalgia, LBBB, sinus tachycardia, DM, GERD; admitted on 05/28/2016, with complaint of shortness of breath, was found to have acute hypoxic respiratory failure with findings suspicious for interstitial pneumonitis. Currently further plan is further workup.  Assessment and Plan: 1. Acute hypoxic respiratory failure. Suspected Acute interstitial pneumonitis versus nonspecific interstitial pneumonitis. Patient is hypoxic on room air requiring 3 L of oxygen. Echocardiogram also shows systolic dysfunction but BNP and clinically the patient is euvolemic. With this patient's primary presentation is likely associated with her lung findings and pulmonary consulted. Appreciate input. Current plan is to obtain inflammatory workup and plan for bronchoscopy with BAL. Continue close monitoring at present. Holding aspirin and DVT prophylaxis.  2. Type 2 diabetes mellitus. Placing the patient on sliding scale insulin.  3. Inappropriate sinus or tachycardia.  patient is on beta blocker at home.  will continue to close monitoring.  4. History of GERD. We will continue scheduled PPI.  5. Fibromyalgia. Continue Cymbalta.  Pain management: When necessary Tylenol Activity: No indication for physical therapy Bowel regimen: last BM 05/29/2016 Diet: Cardiac diet DVT Prophylaxis: subcutaneous Heparin  Advance goals of care discussion: Full code  Family Communication: family was present at bedside, at the time of interview. The pt provided permission to discuss medical plan with the family. Opportunity was given to ask question and all questions were answered satisfactorily.   Disposition:  Discharge to home. Expected discharge date:  06/03/2016, improvement in hypoxia  Consultants: Pulmonary Procedures: Echocardiogram  Antibiotics: Anti-infectives    None      Subjective: Feeling somewhat better. No chest pain no abdominal pain. No nausea no vomiting.   Objective: Physical Exam: Vitals:   05/29/16 1631 05/29/16 2059 05/30/16 0532 05/30/16 0919  BP: 134/72 121/74 (!) 108/57 (!) 105/58  Pulse: 87 92 85 80  Resp: 18 18 17 18   Temp: 97.9 F (36.6 C) 98.5 F (36.9 C) 98.4 F (36.9 C) 98.2 F (36.8 C)  TempSrc: Oral Oral Oral Oral  SpO2: 97% 95% 95% 96%  Weight:  72.1 kg (158 lb 14.4 oz)    Height:        Intake/Output Summary (Last 24 hours) at 05/30/16 1835 Last data filed at 05/30/16 1400  Gross per 24 hour  Intake              840 ml  Output             2475 ml  Net            -1635 ml   Filed Weights   05/28/16 1652 05/29/16 0230 05/29/16 2059  Weight: 72.6 kg (160 lb) 73 kg (161 lb) 72.1 kg (158 lb 14.4 oz)    General: Alert, Awake and Oriented to Time, Place and Person. Appear in mild distress, affect appropriate Eyes: PERRL, Conjunctiva normal ENT: Oral Mucosa clear moist. Neck: no JVD, no Abnormal Mass Or lumps Cardiovascular: S1 and S2 Present, no Murmur, Respiratory: Bilateral Air entry equal and Decreased, no use of accessory muscle, bilateral Crackles, no wheezes Abdomen: Bowel Sound present, Soft and no tenderness Skin: no redness, no Rash, no induration Extremities: no Pedal edema, no calf tenderness Neurologic: Grossly no focal neuro deficit. Bilaterally Equal motor strength  Data Reviewed: CBC:  Recent Labs Lab 05/28/16 1658  WBC 10.5  NEUTROABS 7.1  HGB 15.2*  HCT 46.4*  MCV 86.4  PLT 123456   Basic Metabolic Panel:  Recent Labs Lab 05/28/16 1658  NA 136  K 4.4  CL 103  CO2 23  GLUCOSE 115*  BUN 9  CREATININE 0.68  CALCIUM 9.5    Liver Function Tests: No results for input(s): AST, ALT, ALKPHOS, BILITOT, PROT, ALBUMIN in the last 168 hours. No results for  input(s): LIPASE, AMYLASE in the last 168 hours. No results for input(s): AMMONIA in the last 168 hours. Coagulation Profile: No results for input(s): INR, PROTIME in the last 168 hours. Cardiac Enzymes:  Recent Labs Lab 05/29/16 0812 05/29/16 1340  TROPONINI <0.03 <0.03   BNP (last 3 results) No results for input(s): PROBNP in the last 8760 hours.  CBG:  Recent Labs Lab 05/29/16 0716 05/29/16 1625 05/30/16 0736 05/30/16 1125 05/30/16 1638  GLUCAP 141* 82 117* 147* 140*    Studies: No results found.   Scheduled Meds: . diltiazem  360 mg Oral Daily  . DULoxetine  60 mg Oral BID  . ezetimibe  10 mg Oral Daily  . [START ON 05/31/2016] glimepiride  2 mg Oral Q breakfast  . insulin aspart  0-5 Units Subcutaneous QHS  . insulin aspart  0-9 Units Subcutaneous TID WC  . ketorolac  1 drop Left Eye QID  . nadolol  20 mg Oral Daily  . rosuvastatin  5 mg Oral QHS   Continuous Infusions: PRN Meds: baclofen, pantoprazole, zolpidem  Time spent: 30 minutes  Author: Berle Mull, MD [facility] Pager: [phone] 05/30/2016 6:35 PM  If 7PM-7AM, please contact night-coverage at [email or web address], password [facility]

## 2016-05-30 NOTE — Progress Notes (Signed)
 Name: Kristina Schroeder MRN: [id number] DOB: 07/31/1940    ADMISSION DATE:  05/28/2016 CONSULTATION DATE:  12/20  REFERRING MD :  Patel ([facility])   CHIEF COMPLAINT:  hypoxia  BRIEF PATIENT DESCRIPTION:  75yo female never smoker with hx fibromyalgia, chronic LBBB, sinus tachycardia regulated with B blocker and diltiazem, DM, GERD presented 12/20 with 2-3 weeks of progressive SOB, much worse with exertion, chest congestion, fatigue and general malaise.  In ER pt was afebrile, mildly hypoxic with sats 88% on RA. WBC, troponin, BNP all wnl.   Admitted by [facility] with hypoxia and abnormal CXR and PCCM consulted.   SIGNIFICANT EVENTS    STUDIES:  CT chest 12/20>>> 1. Highly unusual appearance of the lungs. While there are some basal predominant findings which are suggestive of underlying interstitial lung disease, such as nonspecific interstitial pneumonia (NSIP) or early usual interstitial pneumonia (UIP), the predominant finding is that of multifocal randomly distributed airspace consolidation which is a mixture of ground-glass attenuation and more densely consolidated lung. This is favored to represent a separate process, with differential considerations including acute atypical infectious etiologies, including fungal pneumonia or other atypical organisms, eosinophilic pneumonia, acute interstitial pneumonia (AIP), or potentially even multifocal neoplasm such as primary bronchogenic adenocarcinoma. Given the mediastinal and hilar lymphadenopathy, the possibility of an unusual manifestation of alveolar sarcoid is also considered to account for the lung findings, but not favored. Correlation with bronchoscopy is suggested. 2. In addition, there are multifocal ill-defined intermediate attenuation liver lesions which are indeterminate and concerning for potential metastatic disease. This could be further evaluated with nonemergent MRI of the abdomen with and without IV gadolinium in the near  future. 3. Aortic atherosclerosis, in addition to left main and 2 vessel coronary artery disease. Assessment for potential risk factor modification, dietary therapy or pharmacologic therapy may be warranted, if clinically indicated. 4. Mild cardiomegaly. 5. Dilatation of the pulmonic trunk (3.7 cm in diameter), concerning for potential pulmonary arterial hypertension.   HISTORY OF PRESENT ILLNESS:  75yo female with hx fibromyalgia, chronic LBBB, sinus tachycardia regulated with B blocker and diltiazem, DM, GERD presented 12/20 with 2-3 weeks of progressive SOB, much worse with exertion, chest congestion, fatigue and general malaise.  In ER pt was afebrile, mildly hypoxic with sats 88% on RA. WBC, troponin, BNP all wnl.   Admitted by [facility] with hypoxia and abnormal CXR and PCCM consulted.   Pt denies hemoptysis, purulent sputum, fever, orthopnea, PND, edema, weight gain.   She is a retired accountant.    Family hx - no known hx lung disease, RA, lupus, autoimmune  Occupation - accountant  Exposures - always lived in [place], no recent travel, new home  Smoke  - never smoker but father and husband both smoked heavily in the house  hx breast cancer 2003 with radiation and oral chemo.   Does not take flu shot.  Did have pneumovax.   PAST MEDICAL HISTORY :   has a past medical history of Allergy; Bronchitis; Bundle branch block left; Cancer (HCC); Cardiac arrhythmia due to congenital heart disease; Degenerative joint disease; Dyslipidemia; Fibromyalgia; Gastritis; GERD (gastroesophageal reflux disease); Hyperlipidemia; IBS (irritable bowel syndrome); Left ventricular dysfunction; and Shingles.  has a past surgical history that includes Cardiac catheterization (08/2008); Mastectomy, partial; Tonsillectomy; Ganglion cyst excision; Cataract extraction (4 2013); Breast surgery (12 6 2003); and Tonsillectomy (1963). Prior to Admission medications   Medication Sig Start Date End Date Taking? Authorizing  Provider  acetaminophen (TYLENOL) 500 MG tablet Take 500-1,000 mg   by mouth every 6 (six) hours as needed for headache (or pain).   Yes Historical Provider, MD  aspirin EC 81 MG tablet Take 81 mg by mouth every other day.   Yes Historical Provider, MD  baclofen (LIORESAL) 10 MG tablet Take 10 mg by mouth 2 (two) times daily as needed for muscle spasms.  01/05/14  Yes Historical Provider, MD  BIOTIN 5000 PO Take 5,000 mg by mouth daily.    Yes Historical Provider, MD  cetirizine (ZYRTEC) 10 MG tablet Take 10 mg by mouth daily.   Yes Historical Provider, MD  cholecalciferol (VITAMIN D) 1000 UNITS tablet Take 2,000 Units by mouth daily.     Yes Historical Provider, MD  CRESTOR 5 MG tablet TAKE ONE TABLET AT BEDTIME. Patient taking differently: Take 5 mg by mouth at bedtime 04/24/16  Yes Peter M Jordan, MD  diltiazem (CARDIZEM CD) 180 MG 24 hr capsule Take 2 capsules (360 mg total) by mouth daily. 05/20/16  Yes Peter M Jordan, MD  DULoxetine (CYMBALTA) 60 MG capsule Take 1 capsule (60 mg total) by mouth 2 (two) times daily. 02/04/12  Yes Katherine E Tabori, MD  glimepiride (AMARYL) 2 MG tablet Take 2 mg by mouth 2 (two) times daily. 05/22/16  Yes Historical Provider, MD  ibuprofen (ADVIL,MOTRIN) 600 MG tablet Take 1 tablet (600 mg total) by mouth every 6 (six) hours as needed. Patient taking differently: Take 600 mg by mouth every 6 (six) hours as needed for headache (or pain).  07/11/14  Yes Marcy Pfeiffer, MD  ketorolac (ACULAR) 0.5 % ophthalmic solution Place 4 drops into the left eye 4 (four) times daily. Stop date: 05/31/16 05/06/16  Yes Historical Provider, MD  nadolol (CORGARD) 20 MG tablet Take 1 tablet (20 mg total) by mouth daily. 12/25/15  Yes Peter M Jordan, MD  ofloxacin (OCUFLOX) 0.3 % ophthalmic solution Place 4 drops into the left eye 4 (four) times daily. Stop date: 05/31/16 05/06/16  Yes Historical Provider, MD  pantoprazole (PROTONIX) 40 MG tablet Take 40 mg by mouth daily as needed (for  reflux).    Yes Historical Provider, MD  traMADol (ULTRAM) 50 MG tablet Take 1 tablet (50 mg total) by mouth every 6 (six) hours as needed. Patient taking differently: Take 50 mg by mouth every 6 (six) hours as needed (for pain).  07/11/14  Yes Marcy Pfeiffer, MD  Wheat Dextrin (BENEFIBER) POWD Take by mouth See admin instructions. Mix 3 teaspoonsful into juice and drink two times a day   Yes Historical Provider, MD  ZETIA 10 MG tablet TAKE 1 TABLET ONCE DAILY. Patient taking differently: Take 10 mg by mouth once a day 04/24/16  Yes Peter M Jordan, MD  zolpidem (AMBIEN) 10 MG tablet Take 0.5 tablets (5 mg total) by mouth at bedtime as needed. For sleep Patient taking differently: Take 5 mg by mouth at bedtime as needed for sleep.  02/04/12  Yes Katherine E Tabori, MD  GuaiFENesin (MUCINEX PO) Take 1,200 mg by mouth daily as needed.     Historical Provider, MD   Allergies  Allergen Reactions  . Demerol Nausea And Vomiting       . Hydrocodone-Acetaminophen Nausea And Vomiting       . Meperidine Hcl Nausea And Vomiting       . Morphine Nausea And Vomiting        . Morphine And Related Nausea And Vomiting        . Vioxx [Rofecoxib] Nausea And Vomiting             FAMILY HISTORY:  family history includes Arthritis in her father; Cancer in her father and paternal aunt; Crohn's disease in her father; Diabetes in her sister; Heart disease in her mother and sister. SOCIAL HISTORY:  reports that she has never smoked. She has never used smokeless tobacco. She reports that she does not drink alcohol.  REVIEW OF SYSTEMS:   As per HPI - All other systems reviewed and were neg.    SUBJECTIVE:   VITAL SIGNS: Temp:  [97.9 F (36.6 C)-98.5 F (36.9 C)] 98.2 F (36.8 C) (12/21 0919) Pulse Rate:  [80-92] 80 (12/21 0919) Resp:  [17-18] 18 (12/21 0919) BP: (105-134)/(57-74) 105/58 (12/21 0919) SpO2:  [95 %-97 %] 96 % (12/21 0919) Weight:  [158 lb 14.4 oz (72.1 kg)] 158 lb 14.4 oz (72.1 kg) (12/20  2059)  PHYSICAL EXAMINATION: General:  Pleasant, female, no distress Neuro:  Awake, alert, appropriate  HEENT: Moist mucous membranes, no thyromegaly, JVD  Cardiovascular:  S1, S2, regular rate and rhythm Lungs:  Diffuse bilateral crackles. Nonlabored breathing. Abdomen:  Round, soft, non tender  Musculoskeletal:  Warm and dry, no edema   Recent Labs Lab 05/28/16 1658  NA 136  K 4.4  CL 103  CO2 23  BUN 9  CREATININE 0.68  GLUCOSE 115*    Recent Labs Lab 05/28/16 1658  HGB 15.2*  HCT 46.4*  WBC 10.5  PLT 292   Ct Chest High Resolution  Result Date: 05/29/2016 CLINICAL DATA:  75 year old female with history of cough for 3 weeks. Hypoxia. EXAM: CT CHEST WITHOUT CONTRAST TECHNIQUE: Multidetector CT imaging of the chest was performed following the standard protocol without intravenous contrast. High resolution imaging of the lungs, as well as inspiratory and expiratory imaging, was performed. COMPARISON:  Chest CT 08/12/2006. FINDINGS: Cardiovascular: Heart size is mildly enlarged. There is no significant pericardial fluid, thickening or pericardial calcification. There is aortic atherosclerosis, as well as atherosclerosis of the great vessels of the mediastinum and the coronary arteries, including calcified atherosclerotic plaque in the left main, left anterior descending and right coronary arteries. Dilatation of the pulmonic trunk (3.7 cm in diameter), concerning for pulmonary arterial hypertension. Mediastinum/Nodes: There are multiple borderline enlarged and mildly enlarged mediastinal and bilateral hilar lymph nodes measuring up to 12 mm in short axis in the low right paratracheal nodal station. Some partially calcified mediastinal and right hilar lymph nodes are also noted. Small hiatal hernia. No axillary lymphadenopathy. Lungs/Pleura: There are widespread areas of both ground-glass attenuation and frank airspace consolidation scattered randomly throughout the lungs  bilaterally. The distribution of this airspace disease is remarkable for no definite craniocaudal gradient. Minimal associated interlobular septal thickening, predominantly in the lung apices. In some affected regions there is associated cylindrical and varicose bronchiectasis, most evident in the left upper lobe. Given the multifocal airspace consolidation, accurate assessment for interstitial lung disease on today's examination is challenging. With these limitations in mind, high-resolution images do demonstrate some areas of basal predominant subpleural reticulation with what appears to be some peripheral bronchiolectasis. No definite honeycombing is confidently identified. Inspiratory and expiratory imaging demonstrates mild air trapping, indicative of mild small airways disease. No pleural effusions. Upper Abdomen: In the central aspect of segment 4A of the liver there is a well-defined 1.3 x 2.1 cm low-attenuation lesion which is slightly larger than remote prior study 08/12/2006, but favored to represent a cyst. In addition, however, there multiple other ill-defined intermediate attenuation lesions scattered throughout the hepatic parenchyma which are indeterminate but concerning for potential  metastatic disease. The largest of these is in the superior aspect of segment 7 estimated to measure approximately 3.1 x 1.9 cm (axial image 110 of series 4), new compared to the prior examination. Musculoskeletal: There are no aggressive appearing lytic or blastic lesions noted in the visualized portions of the skeleton. IMPRESSION: 1. Highly unusual appearance of the lungs. While there are some basal predominant findings which are suggestive of underlying interstitial lung disease, such as nonspecific interstitial pneumonia (NSIP) or early usual interstitial pneumonia (UIP), the predominant finding is that of multifocal randomly distributed airspace consolidation which is a mixture of ground-glass attenuation and more  densely consolidated lung. This is favored to represent a separate process, with differential considerations including acute atypical infectious etiologies, including fungal pneumonia or other atypical organisms, eosinophilic pneumonia, acute interstitial pneumonia (AIP), or potentially even multifocal neoplasm such as primary bronchogenic adenocarcinoma. Given the mediastinal and hilar lymphadenopathy, the possibility of an unusual manifestation of alveolar sarcoid is also considered to account for the lung findings, but not favored. Correlation with bronchoscopy is suggested. 2. In addition, there are multifocal ill-defined intermediate attenuation liver lesions which are indeterminate and concerning for potential metastatic disease. This could be further evaluated with nonemergent MRI of the abdomen with and without IV gadolinium in the near future. 3. Aortic atherosclerosis, in addition to left main and 2 vessel coronary artery disease. Assessment for potential risk factor modification, dietary therapy or pharmacologic therapy may be warranted, if clinically indicated. 4. Mild cardiomegaly. 5. Dilatation of the pulmonic trunk (3.7 cm in diameter), concerning for potential pulmonary arterial hypertension. Electronically Signed   By: Vinnie Langton M.D.   On: 05/29/2016 07:48    ASSESSMENT / PLAN: Abnormal CT Review of her past imaging shows a CT of the abdomen pelvis from 2011 with mild reticular basilar opacities. She has interstitial infiltrates on chest x-ray dating back to 2016. This appears to be an acute inflammatory process process superimposed on chronic fibrosis. The differential is broad including infectious etiology, inflammatory process with eosinophilic pneumonias, connective tissue disease, sarcoidosis, hypersensitivity pneumonitis. Interestingly she reports acute exposure to mold [place] 3-5 years ago. Malignancy is also on the differential given her history of breast cancer,  mediastinal, hilar lymphadenopathy and liver lesions.  Reccs: - Follow up ANA, ANCA, RF, ESR, anti-SCL, anti-GBM, c3, c4, CRP, ACE level   - Sputum culture if able for now - gram stain, fungal cultures  - Add hypersensitivity panel, serum galcactomannam and Beta glucan. - Check mold ab panel. - Schedule for bronch with BAL, biopsy tomorrow.   Risk benefits of the procedure discussed with the patient. She is agreeable to proceed.  Marshell Garfinkel MD [facility] Pager [phone] If no answer or after 3pm call: [phone] 05/30/2016, 10:01 AM

## 2016-05-31 ENCOUNTER — Encounter (HOSPITAL_COMMUNITY)
Admission: EM | Disposition: A | Discharge status: Home / Self Care | Payer: Self-pay | Source: Home / Self Care | Attending: Internal Medicine

## 2016-05-31 ENCOUNTER — Inpatient Hospital Stay (HOSPITAL_COMMUNITY): Payer: Medicare Other

## 2016-05-31 ENCOUNTER — Encounter (HOSPITAL_COMMUNITY): Payer: Self-pay | Admitting: Respiratory Therapy

## 2016-05-31 HISTORY — PX: VIDEO BRONCHOSCOPY: SHX5072

## 2016-05-31 LAB — PROTIME-INR
INR: 1.01
Prothrombin Time: 13.3 seconds (ref 11.4–15.2)

## 2016-05-31 LAB — CBC WITH DIFFERENTIAL/PLATELET
BASOS ABS: 0 10*3/uL (ref 0.0–0.1)
Basophils Relative: 0 %
EOS PCT: 4 %
Eosinophils Absolute: 0.4 10*3/uL (ref 0.0–0.7)
HCT: 47.2 % — ABNORMAL HIGH (ref 36.0–46.0)
Hemoglobin: 15.4 g/dL — ABNORMAL HIGH (ref 12.0–15.0)
LYMPHS ABS: 1.8 10*3/uL (ref 0.7–4.0)
Lymphocytes Relative: 17 %
MCH: 28.9 pg (ref 26.0–34.0)
MCHC: 32.6 g/dL (ref 30.0–36.0)
MCV: 88.6 fL (ref 78.0–100.0)
MONO ABS: 1.1 10*3/uL — AB (ref 0.1–1.0)
Monocytes Relative: 10 %
Neutro Abs: 7.4 10*3/uL (ref 1.7–7.7)
Neutrophils Relative %: 69 %
PLATELETS: 218 10*3/uL (ref 150–400)
RBC: 5.33 MIL/uL — AB (ref 3.87–5.11)
RDW: 14.4 % (ref 11.5–15.5)
WBC: 10.7 10*3/uL — AB (ref 4.0–10.5)

## 2016-05-31 LAB — GLUCOSE, CAPILLARY
GLUCOSE-CAPILLARY: 132 mg/dL — AB (ref 65–99)
Glucose-Capillary: 145 mg/dL — ABNORMAL HIGH (ref 65–99)
Glucose-Capillary: 106 mg/dL — ABNORMAL HIGH (ref 65–99)
Glucose-Capillary: 107 mg/dL — ABNORMAL HIGH (ref 65–99)

## 2016-05-31 LAB — BODY FLUID CELL COUNT WITH DIFFERENTIAL
Eos, Fluid: 11 %
Lymphs, Fluid: 39 %
Monocyte-Macrophage-Serous Fluid: 36 % — ABNORMAL LOW (ref 50–90)
NEUTROPHIL FLUID: 14 % (ref 0–25)
WBC FLUID: 305 uL (ref 0–1000)

## 2016-05-31 LAB — MISC LABCORP TEST (SEND OUT): Labcorp test code: 183805

## 2016-05-31 LAB — BASIC METABOLIC PANEL
ANION GAP: 11 (ref 5–15)
BUN: 11 mg/dL (ref 6–20)
CALCIUM: 9.3 mg/dL (ref 8.9–10.3)
CO2: 19 mmol/L — ABNORMAL LOW (ref 22–32)
Chloride: 104 mmol/L (ref 101–111)
Creatinine, Ser: 0.75 mg/dL (ref 0.44–1.00)
GFR calc Af Amer: >60 mL/min (ref >60)
GLUCOSE: 135 mg/dL — AB (ref 65–99)
Potassium: 5.3 mmol/L — ABNORMAL HIGH (ref 3.5–5.1)
Sodium: 134 mmol/L — ABNORMAL LOW (ref 135–145)

## 2016-05-31 SURGERY — BRONCHOSCOPY, WITH FLUOROSCOPY
Anesthesia: Moderate Sedation | Laterality: Bilateral

## 2016-05-31 MED ORDER — FENTANYL CITRATE (PF) 100 MCG/2ML IJ SOLN
INTRAMUSCULAR | Status: AC
  Filled 2016-05-31: qty 4

## 2016-05-31 MED ORDER — LIDOCAINE HCL 2 % EX GEL: 1.0000 "application " | Freq: Once | CUTANEOUS | Status: DC

## 2016-05-31 MED ORDER — PANTOPRAZOLE SODIUM 40 MG PO TBEC
40.0000 mg | DELAYED_RELEASE_TABLET | Freq: Every day | ORAL | Status: DC
  Administered 2016-05-31 – 2016-06-01 (×2): 40 mg via ORAL
  Filled 2016-05-31 (×2): qty 1

## 2016-05-31 MED ORDER — ONDANSETRON HCL 4 MG/2ML IJ SOLN
INTRAMUSCULAR | Status: AC
  Filled 2016-05-31: qty 2

## 2016-05-31 MED ORDER — LIDOCAINE HCL 1 % IJ SOLN
INTRAMUSCULAR | Status: DC | PRN
  Administered 2016-05-31: 6 mL via RESPIRATORY_TRACT

## 2016-05-31 MED ORDER — ONDANSETRON HCL 4 MG/2ML IJ SOLN
4.0000 mg | Freq: Once | INTRAMUSCULAR | Status: AC
  Administered 2016-05-31: 4 mg via INTRAVENOUS

## 2016-05-31 MED ORDER — BUTAMBEN-TETRACAINE-BENZOCAINE 2-2-14 % EX AERO: 1.0000 | INHALATION_SPRAY | Freq: Once | CUTANEOUS | Status: DC

## 2016-05-31 MED ORDER — LIDOCAINE HCL 2 % EX GEL
CUTANEOUS | Status: DC | PRN
  Administered 2016-05-31: 1

## 2016-05-31 MED ORDER — MIDAZOLAM HCL 10 MG/2ML IJ SOLN
INTRAMUSCULAR | Status: DC | PRN
Start: 2016-05-31 — End: 2016-05-31
  Administered 2016-05-31 (×3): 1 mg via INTRAVENOUS

## 2016-05-31 MED ORDER — ONDANSETRON HCL 4 MG/2ML IJ SOLN: 4.0000 mg | Freq: Four times a day (QID) | INTRAMUSCULAR | Status: DC | PRN

## 2016-05-31 MED ORDER — PHENYLEPHRINE HCL 0.25 % NA SOLN
NASAL | Status: DC | PRN
  Administered 2016-05-31: 2 via NASAL

## 2016-05-31 MED ORDER — MIDAZOLAM HCL 5 MG/ML IJ SOLN
INTRAMUSCULAR | Status: AC
  Filled 2016-05-31: qty 2

## 2016-05-31 MED ORDER — FENTANYL CITRATE (PF) 100 MCG/2ML IJ SOLN
INTRAMUSCULAR | Status: DC | PRN
  Administered 2016-05-31 (×2): 25 ug via INTRAVENOUS

## 2016-05-31 MED ORDER — SODIUM CHLORIDE 0.9 % IV SOLN
INTRAVENOUS | Status: DC
  Administered 2016-05-31: 14:00:00 via INTRAVENOUS

## 2016-05-31 MED ORDER — PHENYLEPHRINE HCL 0.25 % NA SOLN: 1.0000 | Freq: Four times a day (QID) | NASAL | Status: DC | PRN

## 2016-05-31 NOTE — Procedures (Signed)
PCCM Video Bronchoscopy Procedure Note  The patient was informed of the risks (including but not limited to bleeding, infection, respiratory failure, lung injury, tooth/oral injury) and benefits of the procedure and gave consent, see chart.  Indication: B/L lung infiltrates  Post Procedure Diagnosis: Same  Location: B/L lungs  Condition pre procedure:Stable  Medications for procedure: 4 mg versed, 50 mcg fentanyl  Procedure description: The bronchoscope was introduced through the nose and passed to the bilateral lungs to the level of the subsegmental bronchi throughout the tracheobronchial tree.  Airway exam revealed normal anatomy with no endobronchial lesions  Procedures performed: RML lavage with 50 cc X 3. Clear return of about 100cc Lt UL and Lt lingla transbronchial biopsies   Condition post procedure: Stable  EBL: 5cc  Complications: None. CXR is pending.  Marshell Garfinkel MD [facility] Pager [phone] If no answer or after 3pm call: [phone] 05/31/2016, 3:35 PM

## 2016-05-31 NOTE — Progress Notes (Signed)
Video Bronchoscopy done  Intervention Bronchial washing done Intervention Bronchial biopsy done 

## 2016-05-31 NOTE — Progress Notes (Signed)
[facility] Progress Note  Patient: Kristina Schroeder [id number]   PCP: Cari Caraway, MD DOB: January 06, 1941   DOA: 05/28/2016   DOS: 05/31/2016   Date of Service: the patient was seen and examined on 05/31/2016  Brief hospital course: Pt. with PMH of Fibromyalgia, LBBB, sinus tachycardia, DM, GERD; admitted on 05/28/2016, with complaint of shortness of breath, was found to have acute hypoxic respiratory failure with findings suspicious for interstitial pneumonitis. Currently further plan is further workup.  Assessment and Plan: 1. Acute hypoxic respiratory failure. Suspected Acute interstitial pneumonitis versus nonspecific interstitial pneumonitis. Patient is hypoxic on room air requiring 3 L of oxygen. Echocardiogram also shows Mild systolic dysfunction but BNP normal and clinically the patient is euvolemic. Appreciate input. Current plan is to obtain inflammatory workup and plan for bronchoscopy with BAL. Continue close monitoring at present. Holding aspirin and DVT prophylaxis.  2. Type 2 diabetes mellitus. Placing the patient on sliding scale insulin.  3. Inappropriate sinus or tachycardia.  patient is on beta blocker at home.  will continue to close monitoring.  4. History of GERD. We will continue scheduled PPI.  5. Fibromyalgia. Continue Cymbalta.  Pain management: When necessary Tylenol Activity: No indication for physical therapy Bowel regimen: last BM 05/29/2016 Diet: Cardiac diet DVT Prophylaxis: subcutaneous Heparin  Advance goals of care discussion: Full code  Family Communication: family was present at bedside, at the time of interview. The pt provided permission to discuss medical plan with the family. Opportunity was given to ask question and all questions were answered satisfactorily.   Disposition:  Discharge to home. Expected discharge date: 06/03/2016, improvement in hypoxia  Consultants: Pulmonary Procedures:  Echocardiogram  Antibiotics: Anti-infectives    None      Subjective: Complains about some nausea. No chest pain and abdominal pain.  Objective: Physical Exam: Vitals:   05/31/16 1505 05/31/16 1510 05/31/16 1515 05/31/16 1520  BP: (!) 146/74 (!) 158/75 (!) 148/71 (!) 156/74  Pulse: 77 77 79 76  Resp: 17 16 18 14   Temp:      TempSrc:      SpO2: 91% 92% 94% 95%  Weight:      Height:        Intake/Output Summary (Last 24 hours) at 05/31/16 1525 Last data filed at 05/31/16 1347  Gross per 24 hour  Intake              835 ml  Output             1250 ml  Net             -415 ml   Filed Weights   05/29/16 0230 05/29/16 2059 05/30/16 1900  Weight: 73 kg (161 lb) 72.1 kg (158 lb 14.4 oz) 71.8 kg (158 lb 6.4 oz)    General: Alert, Awake and Oriented to Time, Place and Person. Appear in mild distress, affect appropriate Eyes: PERRL, Conjunctiva normal ENT: Oral Mucosa clear moist. Neck: no JVD, no Abnormal Mass Or lumps Cardiovascular: S1 and S2 Present, no Murmur, Respiratory: Bilateral Air entry equal and Decreased, no use of accessory muscle, bilateral Crackles, no wheezes Abdomen: Bowel Sound present, Soft and no tenderness Skin: no redness, no Rash, no induration Extremities: no Pedal edema, no calf tenderness Neurologic: Grossly no focal neuro deficit. Bilaterally Equal motor strength  Data Reviewed: CBC:  Recent Labs Lab 05/28/16 1658 05/31/16 0513  WBC 10.5 10.7*  NEUTROABS 7.1 7.4  HGB 15.2* 15.4*  HCT 46.4* 47.2*  MCV 86.4 88.6  PLT 292 [id number]   Basic Metabolic Panel:  Recent Labs Lab 05/28/16 1658 05/31/16 0513  NA 136 134*  K 4.4 5.3*  CL 103 104  CO2 23 19*  GLUCOSE 115* 135*  BUN 9 11  CREATININE 0.68 0.75  CALCIUM 9.5 9.3    Liver Function Tests: No results for input(s): AST, ALT, ALKPHOS, BILITOT, PROT, ALBUMIN in the last 168 hours. No results for input(s): LIPASE, AMYLASE in the last 168 hours. No results for input(s): AMMONIA in the  last 168 hours. Coagulation Profile:  Recent Labs Lab 05/31/16 0513  INR 1.01   Cardiac Enzymes:  Recent Labs Lab 05/29/16 0812 05/29/16 1340  TROPONINI <0.03 <0.03   BNP (last 3 results) No results for input(s): PROBNP in the last 8760 hours.  CBG:  Recent Labs Lab 05/30/16 1125 05/30/16 1638 05/30/16 2040 05/31/16 0723 05/31/16 1215  GLUCAP 147* 140* 116* 145* 132*    Studies: No results found.   Scheduled Meds: . [MAR Hold] diltiazem  360 mg Oral Daily  . [MAR Hold] DULoxetine  60 mg Oral BID  . [MAR Hold] ezetimibe  10 mg Oral Daily  . [MAR Hold] insulin aspart  0-5 Units Subcutaneous QHS  . [MAR Hold] insulin aspart  0-9 Units Subcutaneous TID WC  . [MAR Hold] ketorolac  1 drop Left Eye QID  . [MAR Hold] nadolol  20 mg Oral Daily  . [MAR Hold] pantoprazole  40 mg Oral Daily  . [MAR Hold] rosuvastatin  5 mg Oral QHS   Continuous Infusions: . sodium chloride 10 mL/hr at 05/31/16 1347   PRN Meds: [MAR Hold] baclofen, fentaNYL, lidocaine, lidocaine, midazolam, [MAR Hold] ondansetron (ZOFRAN) IV, phenylephrine, [MAR Hold] zolpidem  Time spent: 30 minutes  Author: Berle Mull, MD [facility] Pager: [phone] 05/31/2016 3:25 PM  If 7PM-7AM, please contact night-coverage at [email or web address], password [facility]

## 2016-05-31 NOTE — Progress Notes (Signed)
 Name: Kristina Schroeder MRN: [id number] DOB: 05/18/1941    ADMISSION DATE:  05/28/2016 CONSULTATION DATE:  12/20  REFERRING MD :  Patel ([facility])   CHIEF COMPLAINT:  hypoxia  BRIEF PATIENT DESCRIPTION:  75yo female never smoker with hx fibromyalgia, chronic LBBB, sinus tachycardia regulated with B blocker and diltiazem, DM, GERD presented 12/20 with 2-3 weeks of progressive SOB, much worse with exertion, chest congestion, fatigue and general malaise.  In ER pt was afebrile, mildly hypoxic with sats 88% on RA. WBC, troponin, BNP all wnl.   Admitted by [facility] with hypoxia and abnormal CXR and PCCM consulted.   SIGNIFICANT EVENTS    STUDIES:  CT chest 12/20>>> 1. Highly unusual appearance of the lungs. While there are some basal predominant findings which are suggestive of underlying interstitial lung disease, such as nonspecific interstitial pneumonia (NSIP) or early usual interstitial pneumonia (UIP), the predominant finding is that of multifocal randomly distributed airspace consolidation which is a mixture of ground-glass attenuation and more densely consolidated lung. This is favored to represent a separate process, with differential considerations including acute atypical infectious etiologies, including fungal pneumonia or other atypical organisms, eosinophilic pneumonia, acute interstitial pneumonia (AIP), or potentially even multifocal neoplasm such as primary bronchogenic adenocarcinoma. Given the mediastinal and hilar lymphadenopathy, the possibility of an unusual manifestation of alveolar sarcoid is also considered to account for the lung findings, but not favored. Correlation with bronchoscopy is suggested. 2. In addition, there are multifocal ill-defined intermediate attenuation liver lesions which are indeterminate and concerning for potential metastatic disease. This could be further evaluated with nonemergent MRI of the abdomen with and without IV gadolinium in the near  future. 3. Aortic atherosclerosis, in addition to left main and 2 vessel coronary artery disease. Assessment for potential risk factor modification, dietary therapy or pharmacologic therapy may be warranted, if clinically indicated. 4. Mild cardiomegaly. 5. Dilatation of the pulmonic trunk (3.7 cm in diameter), concerning for potential pulmonary arterial hypertension.   HISTORY OF PRESENT ILLNESS:  75yo female with hx fibromyalgia, chronic LBBB, sinus tachycardia regulated with B blocker and diltiazem, DM, GERD presented 12/20 with 2-3 weeks of progressive SOB, much worse with exertion, chest congestion, fatigue and general malaise.  In ER pt was afebrile, mildly hypoxic with sats 88% on RA. WBC, troponin, BNP all wnl.   Admitted by [facility] with hypoxia and abnormal CXR and PCCM consulted.   Pt denies hemoptysis, purulent sputum, fever, orthopnea, PND, edema, weight gain.   She is a retired accountant.    Family hx - no known hx lung disease, RA, lupus, autoimmune  Occupation - accountant  Exposures - always lived in [place], no recent travel, new home  Smoke  - never smoker but father and husband both smoked heavily in the house  hx breast cancer 2003 with radiation and oral chemo.   Does not take flu shot.  Did have pneumovax.   PAST MEDICAL HISTORY :   has a past medical history of Allergy; Bronchitis; Bundle branch block left; Cancer (HCC); Cardiac arrhythmia due to congenital heart disease; Degenerative joint disease; Dyslipidemia; Fibromyalgia; Gastritis; GERD (gastroesophageal reflux disease); Hyperlipidemia; IBS (irritable bowel syndrome); Left ventricular dysfunction; and Shingles.  has a past surgical history that includes Cardiac catheterization (08/2008); Mastectomy, partial; Tonsillectomy; Ganglion cyst excision; Cataract extraction (4 2013); Breast surgery (12 6 2003); and Tonsillectomy (1963). Prior to Admission medications   Medication Sig Start Date End Date Taking? Authorizing  Provider  acetaminophen (TYLENOL) 500 MG tablet Take 500-1,000 mg   by mouth every 6 (six) hours as needed for headache (or pain).   Yes Historical Provider, MD  aspirin EC 81 MG tablet Take 81 mg by mouth every other day.   Yes Historical Provider, MD  baclofen (LIORESAL) 10 MG tablet Take 10 mg by mouth 2 (two) times daily as needed for muscle spasms.  01/05/14  Yes Historical Provider, MD  BIOTIN 5000 PO Take 5,000 mg by mouth daily.    Yes Historical Provider, MD  cetirizine (ZYRTEC) 10 MG tablet Take 10 mg by mouth daily.   Yes Historical Provider, MD  cholecalciferol (VITAMIN D) 1000 UNITS tablet Take 2,000 Units by mouth daily.     Yes Historical Provider, MD  CRESTOR 5 MG tablet TAKE ONE TABLET AT BEDTIME. Patient taking differently: Take 5 mg by mouth at bedtime 04/24/16  Yes Peter M Jordan, MD  diltiazem (CARDIZEM CD) 180 MG 24 hr capsule Take 2 capsules (360 mg total) by mouth daily. 05/20/16  Yes Peter M Jordan, MD  DULoxetine (CYMBALTA) 60 MG capsule Take 1 capsule (60 mg total) by mouth 2 (two) times daily. 02/04/12  Yes Katherine E Tabori, MD  glimepiride (AMARYL) 2 MG tablet Take 2 mg by mouth 2 (two) times daily. 05/22/16  Yes Historical Provider, MD  ibuprofen (ADVIL,MOTRIN) 600 MG tablet Take 1 tablet (600 mg total) by mouth every 6 (six) hours as needed. Patient taking differently: Take 600 mg by mouth every 6 (six) hours as needed for headache (or pain).  07/11/14  Yes Marcy Pfeiffer, MD  ketorolac (ACULAR) 0.5 % ophthalmic solution Place 4 drops into the left eye 4 (four) times daily. Stop date: 05/31/16 05/06/16  Yes Historical Provider, MD  nadolol (CORGARD) 20 MG tablet Take 1 tablet (20 mg total) by mouth daily. 12/25/15  Yes Peter M Jordan, MD  ofloxacin (OCUFLOX) 0.3 % ophthalmic solution Place 4 drops into the left eye 4 (four) times daily. Stop date: 05/31/16 05/06/16  Yes Historical Provider, MD  pantoprazole (PROTONIX) 40 MG tablet Take 40 mg by mouth daily as needed (for  reflux).    Yes Historical Provider, MD  traMADol (ULTRAM) 50 MG tablet Take 1 tablet (50 mg total) by mouth every 6 (six) hours as needed. Patient taking differently: Take 50 mg by mouth every 6 (six) hours as needed (for pain).  07/11/14  Yes Marcy Pfeiffer, MD  Wheat Dextrin (BENEFIBER) POWD Take by mouth See admin instructions. Mix 3 teaspoonsful into juice and drink two times a day   Yes Historical Provider, MD  ZETIA 10 MG tablet TAKE 1 TABLET ONCE DAILY. Patient taking differently: Take 10 mg by mouth once a day 04/24/16  Yes Peter M Jordan, MD  zolpidem (AMBIEN) 10 MG tablet Take 0.5 tablets (5 mg total) by mouth at bedtime as needed. For sleep Patient taking differently: Take 5 mg by mouth at bedtime as needed for sleep.  02/04/12  Yes Katherine E Tabori, MD  GuaiFENesin (MUCINEX PO) Take 1,200 mg by mouth daily as needed.     Historical Provider, MD   Allergies  Allergen Reactions  . Demerol Nausea And Vomiting       . Hydrocodone-Acetaminophen Nausea And Vomiting       . Meperidine Hcl Nausea And Vomiting       . Morphine Nausea And Vomiting        . Morphine And Related Nausea And Vomiting        . Vioxx [Rofecoxib] Nausea And Vomiting             FAMILY HISTORY:  family history includes Arthritis in her father; Cancer in her father and paternal aunt; Crohn's disease in her father; Diabetes in her sister; Heart disease in her mother and sister. SOCIAL HISTORY:  reports that she has never smoked. She has never used smokeless tobacco. She reports that she does not drink alcohol.  REVIEW OF SYSTEMS:   As per HPI - All other systems reviewed and were neg.    SUBJECTIVE:   VITAL SIGNS: Temp:  [98 F (36.7 C)-98.6 F (37 C)] 98.2 F (36.8 C) (12/22 1336) Pulse Rate:  [76-97] 79 (12/22 1525) Resp:  [10-27] 21 (12/22 1525) BP: (105-172)/(53-107) 142/72 (12/22 1525) SpO2:  [85 %-99 %] 92 % (12/22 1525) Weight:  [158 lb 6.4 oz (71.8 kg)] 158 lb 6.4 oz (71.8 kg) (12/21  1900)  PHYSICAL EXAMINATION: General:  Pleasant, female, no distress Neuro:  Awake, alert, appropriate  HEENT: Moist mucous membranes, no thyromegaly, JVD  Cardiovascular:  S1, S2, regular rate and rhythm Lungs:  Diffuse bilateral crackles. Nonlabored breathing. Abdomen:  Round, soft, non tender  Musculoskeletal:  Warm and dry, no edema   Recent Labs Lab 05/28/16 1658 05/31/16 0513  NA 136 134*  K 4.4 5.3*  CL 103 104  CO2 23 19*  BUN 9 11  CREATININE 0.68 0.75  GLUCOSE 115* 135*    Recent Labs Lab 05/28/16 1658 05/31/16 0513  HGB 15.2* 15.4*  HCT 46.4* 47.2*  WBC 10.5 10.7*  PLT 292 218   No results found.  ASSESSMENT / PLAN: Abnormal CT Review of her past imaging shows a CT of the abdomen pelvis from 2011 with mild reticular basilar opacities. She has interstitial infiltrates on chest x-ray dating back to 2016. This appears to be an acute inflammatory process process superimposed on chronic fibrosis. The differential is broad including infectious etiology, inflammatory process with eosinophilic pneumonias, connective tissue disease, sarcoidosis, hypersensitivity pneumonitis. Interestingly she reports acute exposure [place] 3-5 years ago. Malignancy is also on the differential given her history of breast cancer, mediastinal, hilar lymphadenopathy and liver lesions.  Reccs: - Follow up ANA, ANCA, RF, ESR, anti-SCL, anti-GBM, c3, c4, CRP, ACE level   - Follow hypersensitivity panel, serum galcactomannam and Beta glucan. - Bronch with BAL, biopsy today. Will send for regular cultures, cytology, path, AFB, fungal, cell counts and BAL galactomannan.  Risk benefits of the procedure discussed with the patient. She is agreeable to proceed.  Marshell Garfinkel MD [facility] Pager [phone] If no answer or after 3pm call: [phone] 05/31/2016, 3:28 PM

## 2016-06-01 LAB — GLUCOSE, CAPILLARY
GLUCOSE-CAPILLARY: 136 mg/dL — AB (ref 65–99)
GLUCOSE-CAPILLARY: 117 mg/dL — AB (ref 65–99)
GLUCOSE-CAPILLARY: 111 mg/dL — AB (ref 65–99)
Glucose-Capillary: 135 mg/dL — ABNORMAL HIGH (ref 65–99)

## 2016-06-01 LAB — RHEUMATOID FACTORS, FLUID: Rheumatoid Arthritis, Qn/Fluid: NEGATIVE

## 2016-06-01 LAB — CBC WITH DIFFERENTIAL/PLATELET
Basophils Absolute: 0 10*3/uL (ref 0.0–0.1)
Basophils Relative: 0 %
EOS PCT: 5 %
Eosinophils Absolute: 0.5 10*3/uL (ref 0.0–0.7)
HEMATOCRIT: 45.2 % (ref 36.0–46.0)
HEMOGLOBIN: 14.5 g/dL (ref 12.0–15.0)
LYMPHS ABS: 1.9 10*3/uL (ref 0.7–4.0)
LYMPHS PCT: 19 %
MCH: 28.5 pg (ref 26.0–34.0)
MCHC: 32.1 g/dL (ref 30.0–36.0)
MCV: 88.8 fL (ref 78.0–100.0)
MONOS PCT: 11 %
Monocytes Absolute: 1.1 10*3/uL — ABNORMAL HIGH (ref 0.1–1.0)
NEUTROS ABS: 6.4 10*3/uL (ref 1.7–7.7)
Neutrophils Relative %: 65 %
Platelets: 232 10*3/uL (ref 150–400)
RBC: 5.09 MIL/uL (ref 3.87–5.11)
RDW: 14.2 % (ref 11.5–15.5)
WBC: 9.9 10*3/uL (ref 4.0–10.5)

## 2016-06-01 LAB — BASIC METABOLIC PANEL
ANION GAP: 10 (ref 5–15)
BUN: 12 mg/dL (ref 6–20)
CALCIUM: 9.2 mg/dL (ref 8.9–10.3)
CHLORIDE: 101 mmol/L (ref 101–111)
CO2: 25 mmol/L (ref 22–32)
Creatinine, Ser: 0.66 mg/dL (ref 0.44–1.00)
GFR calc Af Amer: >60 mL/min (ref >60)
GFR calc non Af Amer: >60 mL/min (ref >60)
GLUCOSE: 140 mg/dL — AB (ref 65–99)
Potassium: 4.2 mmol/L (ref 3.5–5.1)
Sodium: 136 mmol/L (ref 135–145)

## 2016-06-01 LAB — PROCALCITONIN

## 2016-06-01 LAB — MAGNESIUM: Magnesium: 2.4 mg/dL (ref 1.7–2.4)

## 2016-06-01 MED ORDER — ENOXAPARIN SODIUM 40 MG/0.4ML ~~LOC~~ SOLN
40.0000 mg | SUBCUTANEOUS | Status: DC
  Administered 2016-06-01: 40 mg via SUBCUTANEOUS
  Filled 2016-06-01: qty 0.4

## 2016-06-01 MED ORDER — ASPIRIN EC 81 MG PO TBEC
81.0000 mg | DELAYED_RELEASE_TABLET | Freq: Every day | ORAL | Status: DC
  Administered 2016-06-02: 81 mg via ORAL
  Filled 2016-06-01: qty 1

## 2016-06-01 MED ORDER — GUAIFENESIN ER 600 MG PO TB12
600.0000 mg | ORAL_TABLET | Freq: Two times a day (BID) | ORAL | Status: DC
  Administered 2016-06-01 – 2016-06-02 (×3): 600 mg via ORAL
  Filled 2016-06-01 (×3): qty 1

## 2016-06-01 NOTE — Progress Notes (Signed)
Incentive spirometry given to patient with instruction on how and when to use.  Patient returned demonstration while RN observed.

## 2016-06-01 NOTE — Progress Notes (Signed)
[facility] Progress Note  Patient: Kristina Schroeder [id number]   PCP: Cari Caraway, MD DOB: 26-Sep-1940   DOA: 05/28/2016   DOS: 06/01/2016   Date of Service: the patient was seen and examined on 06/01/2016  Brief hospital course: Pt. with PMH of Fibromyalgia, LBBB, sinus tachycardia, DM, GERD; admitted on 05/28/2016, with complaint of shortness of breath, was found to have acute hypoxic respiratory failure with findings suspicious for interstitial pneumonitis. Currently further plan is further workup.  Assessment and Plan: 1. Acute hypoxic respiratory failure. Suspected Acute interstitial pneumonitis versus nonspecific interstitial pneumonitis. Patient is hypoxic on room air requiring 3 L of oxygen. Echocardiogram also shows Mild systolic dysfunction but BNP normal and clinically the patient is euvolemic. Appreciate input. Inflammatory workup as well as bronchoscopy BAL unremarkable. I discussed with pulmonary who will wait for recommendation. Continue close monitoring at present. Resume aspirin and DVT prophylaxis.  2. Type 2 diabetes mellitus. Placing the patient on sliding scale insulin.  3. Inappropriate sinus or tachycardia.  patient is on beta blocker at home.  will continue to close monitoring.  4. History of GERD. We will continue scheduled PPI.  5. Fibromyalgia. Continue Cymbalta.  Pain management: When necessary Tylenol Activity: No indication for physical therapy Bowel regimen: last BM 05/29/2016 Diet: Cardiac diet DVT Prophylaxis: subcutaneous Heparin  Advance goals of care discussion: Full code  Family Communication: family was present at bedside, at the time of interview. The pt provided permission to discuss medical plan with the family. Opportunity was given to ask question and all questions were answered satisfactorily.   Disposition:  Discharge to home. Expected discharge date: 06/02/2016, improvement in hypoxia  Consultants:  Pulmonary Procedures: Echocardiogram  Antibiotics: Anti-infectives    None      Subjective: No acute complaint, no nausea no vomiting.  Objective: Physical Exam: Vitals:   05/31/16 2105 06/01/16 0615 06/01/16 1000 06/01/16 1651  BP: 118/60 101/61 (!) 110/57 (!) 114/58  Pulse: 85 74 77 83  Resp: 18  18 18   Temp: 98 F (36.7 C) 98.9 F (37.2 C) 98.5 F (36.9 C) 98.2 F (36.8 C)  TempSrc: Oral Oral Oral Oral  SpO2: 94% 99% 97% 99%  Weight:      Height:        Intake/Output Summary (Last 24 hours) at 06/01/16 1758 Last data filed at 06/01/16 1300  Gross per 24 hour  Intake              840 ml  Output              350 ml  Net              490 ml   Filed Weights   05/29/16 0230 05/29/16 2059 05/30/16 1900  Weight: 73 kg (161 lb) 72.1 kg (158 lb 14.4 oz) 71.8 kg (158 lb 6.4 oz)    General: Alert, Awake and Oriented to Time, Place and Person. Appear in mild distress, affect appropriate Eyes: PERRL, Conjunctiva normal ENT: Oral Mucosa clear moist. Neck: no JVD, no Abnormal Mass Or lumps Cardiovascular: S1 and S2 Present, no Murmur, Respiratory: Bilateral Air entry equal and Decreased, no use of accessory muscle, bilateral Crackles, no wheezes Abdomen: Bowel Sound present, Soft and no tenderness Skin: no redness, no Rash, no induration Extremities: no Pedal edema, no calf tenderness Neurologic: Grossly no focal neuro deficit. Bilaterally Equal motor strength  Data Reviewed: CBC:  Recent Labs Lab 05/28/16 1658 05/31/16 0513 06/01/16 0325  WBC 10.5 10.7* 9.9  NEUTROABS 7.1 7.4 6.4  HGB 15.2* 15.4* 14.5  HCT 46.4* 47.2* 45.2  MCV 86.4 88.6 88.8  PLT 292 218 [id number]   Basic Metabolic Panel:  Recent Labs Lab 05/28/16 1658 05/31/16 0513 06/01/16 0325  NA 136 134* 136  K 4.4 5.3* 4.2  CL 103 104 101  CO2 23 19* 25  GLUCOSE 115* 135* 140*  BUN 9 11 12   CREATININE 0.68 0.75 0.66  CALCIUM 9.5 9.3 9.2  MG  --   --  2.4    Liver Function Tests: No results  for input(s): AST, ALT, ALKPHOS, BILITOT, PROT, ALBUMIN in the last 168 hours. No results for input(s): LIPASE, AMYLASE in the last 168 hours. No results for input(s): AMMONIA in the last 168 hours. Coagulation Profile:  Recent Labs Lab 05/31/16 0513  INR 1.01   Cardiac Enzymes:  Recent Labs Lab 05/29/16 0812 05/29/16 1340  TROPONINI <0.03 <0.03   BNP (last 3 results) No results for input(s): PROBNP in the last 8760 hours.  CBG:  Recent Labs Lab 05/31/16 1641 05/31/16 2145 06/01/16 0806 06/01/16 1231 06/01/16 1647  GLUCAP 106* 107* 136* 117* 111*    Studies: No results found.   Scheduled Meds: . diltiazem  360 mg Oral Daily  . DULoxetine  60 mg Oral BID  . ezetimibe  10 mg Oral Daily  . guaiFENesin  600 mg Oral BID  . insulin aspart  0-5 Units Subcutaneous QHS  . insulin aspart  0-9 Units Subcutaneous TID WC  . ketorolac  1 drop Left Eye QID  . nadolol  20 mg Oral Daily  . pantoprazole  40 mg Oral Daily  . rosuvastatin  5 mg Oral QHS   Continuous Infusions:  PRN Meds: baclofen, ondansetron (ZOFRAN) IV, zolpidem  Time spent: 30 minutes  Author: Berle Mull, MD [facility] Pager: [phone] 06/01/2016 5:58 PM  If 7PM-7AM, please contact night-coverage at [email or web address], password [facility]

## 2016-06-02 DIAGNOSIS — J9601 Acute respiratory failure with hypoxia: Secondary | ICD-10-CM

## 2016-06-02 LAB — CULTURE, BAL-QUANTITATIVE W GRAM STAIN

## 2016-06-02 LAB — CBC WITH DIFFERENTIAL/PLATELET
BASOS PCT: 0 %
Basophils Absolute: 0 10*3/uL (ref 0.0–0.1)
EOS ABS: 0.6 10*3/uL (ref 0.0–0.7)
EOS PCT: 6 %
HCT: 43.8 % (ref 36.0–46.0)
HEMOGLOBIN: 14.1 g/dL (ref 12.0–15.0)
Lymphocytes Relative: 13 %
Lymphs Abs: 1.4 10*3/uL (ref 0.7–4.0)
MCH: 27.8 pg (ref 26.0–34.0)
MCHC: 32.2 g/dL (ref 30.0–36.0)
MCV: 86.4 fL (ref 78.0–100.0)
MONOS PCT: 14 %
Monocytes Absolute: 1.5 10*3/uL — ABNORMAL HIGH (ref 0.1–1.0)
NEUTROS PCT: 67 %
Neutro Abs: 7.4 10*3/uL (ref 1.7–7.7)
PLATELETS: 223 10*3/uL (ref 150–400)
RBC: 5.07 MIL/uL (ref 3.87–5.11)
RDW: 13.7 % (ref 11.5–15.5)
WBC: 11 10*3/uL — AB (ref 4.0–10.5)

## 2016-06-02 LAB — COMPREHENSIVE METABOLIC PANEL
ALK PHOS: 107 U/L (ref 38–126)
ALT: 17 U/L (ref 14–54)
AST: 21 U/L (ref 15–41)
Albumin: 3.4 g/dL — ABNORMAL LOW (ref 3.5–5.0)
Anion gap: 7 (ref 5–15)
BUN: 9 mg/dL (ref 6–20)
CALCIUM: 9.3 mg/dL (ref 8.9–10.3)
CO2: 27 mmol/L (ref 22–32)
CREATININE: 0.69 mg/dL (ref 0.44–1.00)
Chloride: 102 mmol/L (ref 101–111)
Glucose, Bld: 141 mg/dL — ABNORMAL HIGH (ref 65–99)
Potassium: 4.6 mmol/L (ref 3.5–5.1)
Sodium: 136 mmol/L (ref 135–145)
Total Bilirubin: 0.7 mg/dL (ref 0.3–1.2)
Total Protein: 6.6 g/dL (ref 6.5–8.1)

## 2016-06-02 LAB — CULTURE, BAL-QUANTITATIVE
CULTURE: NORMAL — AB
SPECIAL REQUESTS: NORMAL

## 2016-06-02 LAB — GLUCOSE, CAPILLARY
GLUCOSE-CAPILLARY: 140 mg/dL — AB (ref 65–99)
Glucose-Capillary: 164 mg/dL — ABNORMAL HIGH (ref 65–99)

## 2016-06-02 LAB — MAGNESIUM: MAGNESIUM: 2.2 mg/dL (ref 1.7–2.4)

## 2016-06-02 MED ORDER — PANTOPRAZOLE SODIUM 40 MG PO TBEC: 40.0000 mg | DELAYED_RELEASE_TABLET | Freq: Two times a day (BID) | ORAL | 0 refills | Status: AC

## 2016-06-02 MED ORDER — GUAIFENESIN ER 600 MG PO TB12: 600.0000 mg | ORAL_TABLET | Freq: Two times a day (BID) | ORAL | 0 refills | Status: AC

## 2016-06-02 MED ORDER — PREDNISONE 20 MG PO TABS
20.0000 mg | ORAL_TABLET | Freq: Every day | ORAL | Status: DC
  Administered 2016-06-02: 20 mg via ORAL
  Filled 2016-06-02: qty 1

## 2016-06-02 MED ORDER — PREDNISONE 20 MG PO TABS: 20.0000 mg | ORAL_TABLET | Freq: Every day | ORAL | 0 refills | Status: DC

## 2016-06-02 MED ORDER — PANTOPRAZOLE SODIUM 40 MG PO TBEC: 40.0000 mg | DELAYED_RELEASE_TABLET | Freq: Two times a day (BID) | ORAL | Status: DC

## 2016-06-02 MED ORDER — FUROSEMIDE 20 MG PO TABS: 20.0000 mg | ORAL_TABLET | Freq: Every day | ORAL | 0 refills | Status: AC | PRN

## 2016-06-02 NOTE — Care Management Note (Signed)
Case Management Note  Patient Details  Name: Kristina Schroeder MRN: [id number] Date of Birth: 1940-12-17  Subjective/Objective:                  hypoxia Action/Plan: Discharge planning Expected Discharge Date: 06/02/16              Expected Discharge Plan:  Home/Self Care  In-House Referral:     Discharge planning Services  CM Consult  Post Acute Care Choice:  Durable Medical Equipment Choice offered to:  Patient  DME Arranged:  Oxygen DME Agency:  [place]:  NA [place] Agency:  NA  Status of Service:  Completed, signed off  If discussed at [place] of Stay Meetings, dates discussed:    Additional Comments: CM received call from RN to please arrange for home O2.  Cm notified [facility] rep, Jermaine to please  arrange for home O2.  CM notified [facility] rep, Reggie to please deliver the tank to room for transport home.  No other Cm needs were communicated. Dellie Catholic, RN 06/02/2016, 10:53 AM

## 2016-06-02 NOTE — Progress Notes (Signed)
Discharge instructions and medications discussed with patient.  Oxygen delivered to patient's room.  All questions answered.

## 2016-06-02 NOTE — Progress Notes (Signed)
SATURATION QUALIFICATIONS: (This note is used to comply with regulatory documentation for home oxygen)  Patient Saturations on Room Air at Rest = 87 %  Patient Saturations on Room Air while Ambulating   Patient Saturations on 3 Liters of oxygen while Ambulating = 92%  Please briefly explain why patient needs home oxygen:

## 2016-06-02 NOTE — Progress Notes (Signed)
Name: Kristina Schroeder MRN: [id number] DOB: November 09, 1940    ADMISSION DATE:  05/28/2016 CONSULTATION DATE:  12/20  REFERRING MD :  Posey Pronto ([facility])   CHIEF COMPLAINT:  hypoxia  BRIEF PATIENT DESCRIPTION:  75yowf   never smoker with hx fibromyalgia, chronic LBBB, sinus tachycardia regulated with B blocker and diltiazem, DM, GERD presented 12/20 with 2-3 weeks of progressive SOB, much worse with exertion, chest congestion, fatigue and general malaise.  In ER pt was afebrile, mildly hypoxic with sats 88% on RA. WBC, troponin, BNP all wnl.   Admitted by [facility] with hypoxia and abnormal CXR and PCCM consulted.       STUDIES:  CT chest 12/20>>> 1. Highly unusual appearance of the lungs. While there are some basal predominant findings which are suggestive of underlying interstitial lung disease, such as nonspecific interstitial pneumonia (NSIP) or early usual interstitial pneumonia (UIP), the predominant finding is that of multifocal randomly distributed airspace consolidation which is a mixture of ground-glass attenuation and more densely consolidated lung. This is favored to represent a separate process, with differential considerations including acute atypical infectious etiologies, including fungal pneumonia or other atypical organisms, eosinophilic pneumonia, acute interstitial pneumonia (AIP), or potentially even multifocal neoplasm such as primary bronchogenic adenocarcinoma. Given the mediastinal and hilar lymphadenopathy, the possibility of an unusual manifestation of alveolar sarcoid is also considered to account for the lung findings, but not favored. Correlation with bronchoscopy is suggested. 2. In addition, there are multifocal ill-defined intermediate attenuation liver lesions which are indeterminate and concerning for potential metastatic disease. This could be further evaluated with nonemergent MRI of the abdomen with and without IV gadolinium in the near future. 3. Aortic  atherosclerosis, in addition to left main and 2 vessel coronary artery disease. Assessment for potential risk factor modification, dietary therapy or pharmacologic therapy may be warranted, if clinically indicated. 4. Mild cardiomegaly. 5. Dilatation of the pulmonic trunk (3.7 cm in diameter), concerning for potential pulmonary arterial hypertension.              SUBJECTIVE:  Frustrated, wants to go home/ min dry cough/ comfortable on 2lpm   VITAL SIGNS: Temp:  [98.1 F (36.7 C)-98.5 F (36.9 C)] 98.2 F (36.8 C) (12/24 0857) Pulse Rate:  [77-91] 78 (12/24 0857) Resp:  [17-18] 17 (12/24 0857) BP: (94-114)/(48-65) 94/48 (12/24 0857) SpO2:  [88 %-99 %] 88 % (12/24 0857)  PHYSICAL EXAMINATION: General:  Pleasant, female, no distress sitting up in bed  Neuro:  Awake, alert, appropriate  HEENT: Moist mucous membranes, no thyromegaly, JVD  Cardiovascular:  S1, S2, regular rate and rhythm Lungs:  Diffuse bilateral insp crackles. Nonlabored breathing. Abdomen:  Round, soft, non tender  Musculoskeletal:  Warm and dry, no edema   Recent Labs Lab 05/31/16 0513 06/01/16 0325 06/02/16 0409  NA 134* 136 136  K 5.3* 4.2 4.6  CL 104 101 102  CO2 19* 25 27  BUN _0 CREATININE 0.75 0.66 0.69  GLUCOSE 135* 140* 141*    Recent Labs Lab 05/31/16 0513 06/01/16 0325 06/02/16 0409  HGB 15.4* 14.5 14.1  HCT 47.2* 45.2 43.8  WBC 10.7* 9.9 11.0*  PLT 218 232 223   Dg Chest Port 1 View  Result Date: 05/31/2016 CLINICAL DATA:  Post bronchoscopy today. EXAM: PORTABLE CHEST 1 VIEW COMPARISON:  Radiographs 05/28/2016.  CT 05/29/2016. FINDINGS: 1505 hours. There are lower lung volumes with mild resulting increased atelectasis at the left lung base. Otherwise, patchy ground-glass and airspace opacities are not  significantly changed. No pneumothorax or significant pleural effusion identified. The heart size and mediastinal contours are stable. IMPRESSION: No demonstrated  complication following bronchoscopy. Electronically Signed   By: Richardean Sale M.D.   On: 05/31/2016 16:48    ASSESSMENT / PLAN: Diffuse pulmonary infiltrates of unkown etiology  ddx =  Miscellaneous:Alv microlithiasis, alv proteinosis, asp, bronchiectais, BOOP   ARDS/ AIP Occupational dz/ HSP Neoplasm Infection > strongly doubt clinically by hx  Drug > exp  To oral chemo only ? Hormonal  Pulmonary emboli, Protein disorders Edema/Eosinophilic dz Sarcoidosis Collagen Vasc dz Hist X / Hemorrhage Idiopathic  Review of her past imaging shows a CT of the abdomen pelvis from 2011 with mild reticular basilar opacities. She has interstitial infiltrates on chest x-ray dating back to 2016. This appears to be an acute inflammatory process process superimposed on chronic fibrosis. The differential is broad including infectious etiology, inflammatory process with eosinophilic pneumonias, connective tissue disease, sarcoidosis, hypersensitivity pneumonitis. Interestingly she reports acute exposure [place] years ago. Malignancy is also on the differential given her history of breast cancer, mediastinal, hilar lymphadenopathy and liver lesions.  Recs: - Follow up ANA, ANCA, RF, ESR, anti-SCL, anti-GBM, c3, c4, CRP, ACE level  All pending  - Follow hypersensitivity panel, serum galcactomannam and Beta glucan. 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

## 2016-06-04 LAB — PNEUMOCYSTIS JIROVECI SMEAR BY DFA: PNEUMOCYSTIS JIROVECI AG: NEGATIVE

## 2016-06-04 NOTE — Discharge Summary (Signed)
[facility] Discharge Summary   Patient: Kristina Schroeder [id number]   PCP: Cari Caraway, MD DOB: Feb 13, 1941   Date of admission: 05/28/2016   Date of discharge: 06/02/2016     Discharge Diagnoses:  Principal Problem:   Hypoxia Active Problems:   Inappropriate sinus node tachycardia   Fibromyalgia   Type 2 diabetes mellitus without complication, without long-term current use of insulin ([place])   Acute hypoxemic respiratory failure ([place])   Admitted From: home Disposition:  home  Recommendations for Outpatient Follow-up:  1. Follow-up with PCP in one week. 2. Follow-up with pulmonary as recommended.  3. Home on oxygen 3 L/m  Follow-up Information    MCNEILL,WENDY, MD. Schedule an appointment as soon as possible for a visit in 1 week(s).   Specialty:  Family Medicine Contact information: [place] [address] [phone]        Marshell Garfinkel, MD. Schedule an appointment as soon as possible for a visit in 2 week(s).   Specialty:  Pulmonary Disease Contact information: [address] [place] [address] [phone]          Diet recommendation: Cardiac diet  Activity: The patient is advised to gradually reintroduce usual activities.  Discharge Condition: good  Code Status: full code  History of present illness: As per the H and P dictated on admission, "Kristina Schroeder is a 75 y.o. 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  The patient was in her usual state of health until about 3 or 4 weeks ago when she started to notice insidious onset of shortness of breath with exertion, relieved with rest. This was not associated with chest pain, nor with leg swelling, orthopnea, or PND.  There was a new cough, productive of morning yellow/green sputum, but no fever, chills,  hemoptysis, malaise.  It persisted and worsened until this last Friday 12/15, she was at [facility] for family members graduation, and could barely make it from the car to the auditorium. Today she saw her PCP, who noted rales on exam, obtained a chest x-ray that showed bilateral interstitial markings, and sent her to the emergency room."  Hospital Course:   Summary of her active problems in the hospital is as following. 1. Acute hypoxic respiratory failure. Suspected Acute interstitial pneumonitis versus nonspecific interstitial pneumonitis. Patient is hypoxic on room air requiring 3 L of oxygen. Echocardiogram also shows Mild systolic dysfunction but BNP normal and clinically the patient is euvolemic. Pulmonary consulted Appreciate input. Inflammatory workup as well as bronchoscopy BAL unremarkable. Resume aspirin At present recommend the patient to initiate on oral prednisone 20 mg daily. We'll also change her Protonix from daily to twice a day We will give her when necessary Lasix  2. Type 2 diabetes mellitus. Continue home regimen  3. Inappropriate sinus or tachycardia.  patient is on beta blocker at home.  will continue home regimen  4. History of GERD. We will continue scheduled PPI. Change to twice a day  5. Fibromyalgia. Continue Cymbalta.   All other chronic medical condition were stable during the hospitalization.  Patient was ambulatory without any assistance. Home oxygen was arranged by Education officer, museum and case Freight forwarder. On the day of the discharge the patient's vitals were stable, and no other acute medical condition were reported by patient. the patient was felt safe to be discharge at home with family.  Procedures and Results:  Bronchoscopy  Echocardiogram  Consultations:  Pulmonary  DISCHARGE MEDICATION: Discharge Medication List as of 06/02/2016 11:48 AM    START taking these medications   Details  furosemide (LASIX) 20 MG tablet Take 1 tablet (20  mg total) by mouth daily as needed for fluid or edema. For weight gain of >3Lbs in 1 day., Starting Sun 06/02/2016, Normal    predniSONE (DELTASONE) 20 MG tablet Take 1 tablet (20 mg total) by mouth daily with breakfast., Starting Mon 06/03/2016, Normal      CONTINUE these medications which have CHANGED   Details  guaiFENesin (MUCINEX) 600 MG 12 hr tablet Take 1 tablet (600 mg total) by mouth 2 (two) times daily., Starting Sun 06/02/2016, Normal    pantoprazole (PROTONIX) 40 MG tablet Take 1 tablet (40 mg total) by mouth 2 (two) times daily before a meal., Starting Sun 06/02/2016, Normal      CONTINUE these medications which have NOT CHANGED   Details  acetaminophen (TYLENOL) 500 MG tablet Take 500-1,000 mg by mouth every 6 (six) hours as needed for headache (or pain)., Historical Med    aspirin EC 81 MG tablet Take 81 mg by mouth every other day., Historical Med    baclofen (LIORESAL) 10 MG tablet Take 10 mg by mouth 2 (two) times daily as needed for muscle spasms. , Starting Wed 01/05/2014, Historical Med    BIOTIN 5000 PO Take 5,000 mg by mouth daily. , Historical Med    cetirizine (ZYRTEC) 10 MG tablet Take 10 mg by mouth daily., Historical Med    cholecalciferol (VITAMIN D) 1000 UNITS tablet Take 2,000 Units by mouth daily.  , Historical Med    CRESTOR 5 MG tablet TAKE ONE TABLET AT BEDTIME., Normal    diltiazem (CARDIZEM CD) 180 MG 24 hr capsule Take 2 capsules (360 mg total) by mouth daily., Starting Mon 05/20/2016, Normal    DULoxetine (CYMBALTA) 60 MG capsule Take 1 capsule (60 mg total) by mouth 2 (two) times daily., Starting Tue 02/04/2012, Normal    glimepiride (AMARYL) 2 MG tablet Take 2 mg by mouth 2 (two) times daily., Starting Wed 05/22/2016, Historical Med    ketorolac (ACULAR) 0.5 % ophthalmic solution Place 4 drops into the left eye 4 (four) times daily. Stop date: 05/31/16, Starting Mon 05/06/2016, Historical Med    nadolol (CORGARD) 20 MG tablet Take 1 tablet  (20 mg total) by mouth daily., Starting Mon 12/25/2015, Normal    ofloxacin (OCUFLOX) 0.3 % ophthalmic solution Place 4 drops into the left eye 4 (four) times daily. Stop date: 05/31/16, Starting Mon 05/06/2016, Historical Med    traMADol (ULTRAM) 50 MG tablet Take 1 tablet (50 mg total) by mouth every 6 (six) hours as needed., Starting Mon 07/11/2014, Print    Wheat Dextrin (BENEFIBER) POWD Take by mouth See admin instructions. Mix 3 teaspoonsful into juice and drink two times a day, Historical Med    ZETIA 10 MG tablet TAKE 1 TABLET ONCE DAILY., Normal    zolpidem (AMBIEN) 10 MG tablet Take 0.5 tablets (5 mg total) by mouth at bedtime as needed. For sleep, Starting Tue 02/04/2012, Print      STOP taking these medications     ibuprofen (ADVIL,MOTRIN) 600 MG tablet        Allergies  Allergen Reactions  . Demerol Nausea And Vomiting       . Hydrocodone-Acetaminophen Nausea And Vomiting       . Meperidine Hcl Nausea And Vomiting       .  Morphine Nausea And Vomiting        . Morphine And Related Nausea And Vomiting        . Vioxx [Rofecoxib] Nausea And Vomiting          Discharge Instructions    Diet - low sodium heart healthy    Complete by:  As directed    Diet Carb Modified    Complete by:  As directed    Discharge instructions    Complete by:  As directed    It is important that you read following instructions as well as go over your medication list with RN to help you understand your care after this hospitalization.  Discharge Instructions: Please follow-up with PCP in one week  Please request your primary care physician to go over all Hospital Tests and Procedure/Radiological results at the follow up,  Please get all Hospital records sent to your PCP by signing hospital release before you go home.   Do not take more than prescribed Pain, Sleep and Anxiety Medications. You were cared for by a hospitalist during your hospital stay. If you have any questions about your  discharge medications or the care you received while you were in the hospital after you are discharged, you can call the unit and ask to speak with the hospitalist on call if the hospitalist that took care of you is not available.  Once you are discharged, your primary care physician will handle any further medical issues. Please note that NO REFILLS for any discharge medications will be authorized once you are discharged, as it is imperative that you return to your primary care physician (or establish a relationship with a primary care physician if you do not have one) for your aftercare needs so that they can reassess your need for medications and monitor your lab values. You Must read complete instructions/literature along with all the possible adverse reactions/side effects for all the Medicines you take and that have been prescribed to you. Take any new Medicines after you have completely understood and accept all the possible adverse reactions/side effects. Wear Seat belts while driving. If you have smoked or chewed Tobacco in the last 2 yrs please stop smoking and/or stop any Recreational drug use.   Increase activity slowly    Complete by:  As directed      Discharge Exam: Filed Weights   05/29/16 0230 05/29/16 2059 05/30/16 1900  Weight: 73 kg (161 lb) 72.1 kg (158 lb 14.4 oz) 71.8 kg (158 lb 6.4 oz)   Vitals:   06/02/16 0515 06/02/16 0857  BP: 106/65 (!) 94/48  Pulse: 77 78  Resp: 18 17  Temp: 98.1 F (36.7 C) 98.2 F (36.8 C)   General: Appear in no distress, no Rash; Oral Mucosa moist. Cardiovascular: S1 and S2 Present, no Murmur, no JVD Respiratory: Bilateral Air entry present and bilateral Crackles, no wheezes Abdomen: Bowel Sound present, Soft and no tenderness Extremities: no Pedal edema, no calf tenderness Neurology: Grossly no focal neuro deficit.  The results of significant diagnostics from this hospitalization (including imaging, microbiology, ancillary and  laboratory) are listed below for reference.    Significant Diagnostic Studies: Ct Chest High Resolution  Result Date: 05/29/2016 CLINICAL DATA:  75 year old female with history of cough for 3 weeks. Hypoxia. EXAM: CT CHEST WITHOUT CONTRAST TECHNIQUE: Multidetector CT imaging of the chest was performed following the standard protocol without intravenous contrast. High resolution imaging of the lungs, as well as inspiratory and expiratory imaging, was performed. COMPARISON:  Chest CT 08/12/2006. FINDINGS: Cardiovascular: Heart size is mildly enlarged. There is no significant pericardial fluid, thickening or pericardial calcification. There is aortic atherosclerosis, as well as atherosclerosis of the great vessels of the mediastinum and the coronary arteries, including calcified atherosclerotic plaque in the left main, left anterior descending and right coronary arteries. Dilatation of the pulmonic trunk (3.7 cm in diameter), concerning for pulmonary arterial hypertension. Mediastinum/Nodes: There are multiple borderline enlarged and mildly enlarged mediastinal and bilateral hilar lymph nodes measuring up to 12 mm in short axis in the low right paratracheal nodal station. Some partially calcified mediastinal and right hilar lymph nodes are also noted. Small hiatal hernia. No axillary lymphadenopathy. Lungs/Pleura: There are widespread areas of both ground-glass attenuation and frank airspace consolidation scattered randomly throughout the lungs bilaterally. The distribution of this airspace disease is remarkable for no definite craniocaudal gradient. Minimal associated interlobular septal thickening, predominantly in the lung apices. In some affected regions there is associated cylindrical and varicose bronchiectasis, most evident in the left upper lobe. Given the multifocal airspace consolidation, accurate assessment for interstitial lung disease on today's examination is challenging. With these limitations in  mind, high-resolution images do demonstrate some areas of basal predominant subpleural reticulation with what appears to be some peripheral bronchiolectasis. No definite honeycombing is confidently identified. Inspiratory and expiratory imaging demonstrates mild air trapping, indicative of mild small airways disease. No pleural effusions. Upper Abdomen: In the central aspect of segment 4A of the liver there is a well-defined 1.3 x 2.1 cm low-attenuation lesion which is slightly larger than remote prior study 08/12/2006, but favored to represent a cyst. In addition, however, there multiple other ill-defined intermediate attenuation lesions scattered throughout the hepatic parenchyma which are indeterminate but concerning for potential metastatic disease. The largest of these is in the superior aspect of segment 7 estimated to measure approximately 3.1 x 1.9 cm (axial image 110 of series 4), new compared to the prior examination. Musculoskeletal: There are no aggressive appearing lytic or blastic lesions noted in the visualized portions of the skeleton. IMPRESSION: 1. Highly unusual appearance of the lungs. While there are some basal predominant findings which are suggestive of underlying interstitial lung disease, such as nonspecific interstitial pneumonia (NSIP) or early usual interstitial pneumonia (UIP), the predominant finding is that of multifocal randomly distributed airspace consolidation which is a mixture of ground-glass attenuation and more densely consolidated lung. This is favored to represent a separate process, with differential considerations including acute atypical infectious etiologies, including fungal pneumonia or other atypical organisms, eosinophilic pneumonia, acute interstitial pneumonia (AIP), or potentially even multifocal neoplasm such as primary bronchogenic adenocarcinoma. Given the mediastinal and hilar lymphadenopathy, the possibility of an unusual manifestation of alveolar sarcoid is  also considered to account for the lung findings, but not favored. Correlation with bronchoscopy is suggested. 2. In addition, there are multifocal ill-defined intermediate attenuation liver lesions which are indeterminate and concerning for potential metastatic disease. This could be further evaluated with nonemergent MRI of the abdomen with and without IV gadolinium in the near future. 3. Aortic atherosclerosis, in addition to left main and 2 vessel coronary artery disease. Assessment for potential risk factor modification, dietary therapy or pharmacologic therapy may be warranted, if clinically indicated. 4. Mild cardiomegaly. 5. Dilatation of the pulmonic trunk (3.7 cm in diameter), concerning for potential pulmonary arterial hypertension. Electronically Signed   By: Vinnie Langton M.D.   On: 05/29/2016 07:48   Dg Chest Port 1 View  Result Date: 05/31/2016 CLINICAL DATA:  Post  bronchoscopy today. EXAM: PORTABLE CHEST 1 VIEW COMPARISON:  Radiographs 05/28/2016.  CT 05/29/2016. FINDINGS: 1505 hours. There are lower lung volumes with mild resulting increased atelectasis at the left lung base. Otherwise, patchy ground-glass and airspace opacities are not significantly changed. No pneumothorax or significant pleural effusion identified. The heart size and mediastinal contours are stable. IMPRESSION: No demonstrated complication following bronchoscopy. Electronically Signed   By: Richardean Sale M.D.   On: 05/31/2016 16:48    Microbiology: Recent Results (from the past 240 hour(s))  Culture, bal-quantitative     Status: Abnormal   Collection Time: 05/31/16  2:55 PM  Result Value Ref Range Status   Specimen Description BRONCHIAL ALVEOLAR LAVAGE  Final   Special Requests Normal  Final   Gram Stain   Final    ABUNDANT WBC PRESENT,BOTH PMN AND MONONUCLEAR RARE GRAM POSITIVE COCCI IN CHAINS    Culture (A)  Final    20,000 COLONIES/mL Consistent with normal respiratory flora.   Report Status  06/02/2016 FINAL  Final  Pneumocystis smear by DFA     Status: None   Collection Time: 05/31/16  2:55 PM  Result Value Ref Range Status   Specimen Source-[facility] BRONCHIAL ALVEOLAR LAVAGE  Final   Pneumocystis jiroveci Ag NEGATIVE  Final    Comment: Performed at [facility]  Aerobic/Anaerobic Culture (surgical/deep wound)     Status: None (Preliminary result)   Collection Time: 05/31/16  3:26 PM  Result Value Ref Range Status   Specimen Description LUNG  Final   Special Requests TRANSBRONCH BX  Final   Gram Stain   Final    ABUNDANT WBC PRESENT, PREDOMINANTLY MONONUCLEAR NO ORGANISMS SEEN    Culture PENDING  Incomplete   Report Status PENDING  Incomplete     Labs: CBC:  Recent Labs Lab 05/31/16 0513 06/01/16 0325 06/02/16 0409  WBC 10.7* 9.9 11.0*  NEUTROABS 7.4 6.4 7.4  HGB 15.4* 14.5 14.1  HCT 47.2* 45.2 43.8  MCV 88.6 88.8 86.4  PLT 218 232 [id number]   Basic Metabolic Panel:  Recent Labs Lab 05/31/16 0513 06/01/16 0325 06/02/16 0409  NA 134* 136 136  K 5.3* 4.2 4.6  CL 104 101 102  CO2 19* 25 27  GLUCOSE 135* 140* 141*  BUN 11 12 9   CREATININE 0.75 0.66 0.69  CALCIUM 9.3 9.2 9.3  MG  --  2.4 2.2   Liver Function Tests:  Recent Labs Lab 06/02/16 0409  AST 21  ALT 17  ALKPHOS 107  BILITOT 0.7  PROT 6.6  ALBUMIN 3.4*   No results for input(s): LIPASE, AMYLASE in the last 168 hours. No results for input(s): AMMONIA in the last 168 hours. Cardiac Enzymes:  Recent Labs Lab 05/29/16 0812 05/29/16 1340  TROPONINI <0.03 <0.03   BNP (last 3 results)  Recent Labs  05/28/16 1658  BNP 22.1   CBG:  Recent Labs Lab 06/01/16 1231 06/01/16 1647 06/01/16 2049 06/02/16 0744 06/02/16 1138  GLUCAP 117* 111* 135* 140* 164*   Time spent: 30 minutes  Signed:  Kaena Santori  [facility] 06/02/2016 , 6:28 PM

## 2016-06-05 ENCOUNTER — Other Ambulatory Visit: Payer: Self-pay | Admitting: Cardiology

## 2016-06-05 LAB — MISC LABCORP TEST (SEND OUT): Labcorp test code: 183805

## 2016-06-05 LAB — ACID FAST SMEAR (AFB)
ACID FAST SMEAR - AFSCU2: NEGATIVE
ACID FAST SMEAR - AFSCU2: NEGATIVE

## 2016-06-05 LAB — ACID FAST SMEAR (AFB, MYCOBACTERIA)

## 2016-06-05 NOTE — Telephone Encounter (Signed)
Overdue for follow up appt.

## 2016-06-06 LAB — MISC LABCORP TEST (SEND OUT): LABCORP TEST CODE: 183805

## 2016-06-06 NOTE — Progress Notes (Signed)
Lab called notifyed CN that the Beta d glucan lab draw ordered by Dr. Rolla Etienne was routed to the wrong place and by the time they received the specimen it had expired and was unable to be tested.

## 2016-06-07 ENCOUNTER — Other Ambulatory Visit: Payer: Self-pay | Admitting: *Deleted

## 2016-06-07 ENCOUNTER — Encounter: Payer: Self-pay | Admitting: Pulmonary Disease

## 2016-06-07 ENCOUNTER — Other Ambulatory Visit: Payer: Self-pay | Admitting: Cardiology

## 2016-06-07 LAB — HYPERSENSITIVITY PNEUMONITIS
A. PULLULANS ABS: NEGATIVE
A.Fumigatus #1 Abs: NEGATIVE
Micropolyspora faeni, IgG: NEGATIVE
PIGEON SERUM ABS: NEGATIVE
THERMOACTINOMYCES VULGARIS IGG: NEGATIVE
Thermoact. Saccharii: NEGATIVE

## 2016-06-07 MED ORDER — EZETIMIBE 10 MG PO TABS: 10.0000 mg | ORAL_TABLET | Freq: Every day | ORAL | 11 refills | Status: AC

## 2016-06-09 LAB — AEROBIC/ANAEROBIC CULTURE (SURGICAL/DEEP WOUND)

## 2016-06-09 LAB — AEROBIC/ANAEROBIC CULTURE W GRAM STAIN (SURGICAL/DEEP WOUND): Culture: NO GROWTH

## 2016-06-11 ENCOUNTER — Telehealth: Payer: Self-pay

## 2016-06-11 NOTE — Telephone Encounter (Signed)
CY  Please Advise- Pt. email  This is a new pt. To [place] who is schedule for an appointment with PM 06/18/16. Please see the email below from pt. The reports were printed to B Side printer.   From: Kristina Schroeder. Kristina Schroeder  Sent: 12/29/20178:11 PM EST  To: Marshell Garfinkel, MD Subject: Visit Follow-Up Question  While in the Hospital the Dr. Lenon Ahmadi he wanted to be Very Aggressive to Determine What was in my Lungs.He Eliminated all Problems with my Heart.He pressed to perform a Lung Biopsy Immediately. He Stated this Needs to Be Done Immediately !!!!!!Please have someone call our Home [phone] and tell us the Lab Report of the Biopsy Test AS SOON AS POSSIBLE.The Dr. Lenon Ahmadi it was Imperative that He Determine What was In My Lungs so he was able to Treat Me.I have been at Home since Christmas Eve, Dec 24th, on Oxygen 24/7.I'm Worried to Death not knowing what is Wrong With Me.NEVER had this Experience BEFORE.Is This Cancer ??????????????????  PLEASECall Me and Give me My Report AS SOON AS POSSIBLE Before I LOOSE MY MIND !!!!!!!!!!!!!!!!!!!!!!!!!  [place]  Cell [phone]   Waiting on Your Immediate Response. Thank You so much.

## 2016-06-11 NOTE — Telephone Encounter (Signed)
Pathology report describes a type of scarring called fibrosis. Dr Vaughan Browner will go over this at next appointment             No cancer and no infection identified.

## 2016-06-11 NOTE — Telephone Encounter (Signed)
CY reviewed the results and below was his message. Called an informed the pt. She understood and stated she will keep her follow up appointment. Nothing further is needed at this time.   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 Dr Vaughan Browner will go over this at next appointment

## 2016-06-11 NOTE — Telephone Encounter (Signed)
Pt calling back again wanting results [phone]

## 2016-06-18 ENCOUNTER — Ambulatory Visit (INDEPENDENT_AMBULATORY_CARE_PROVIDER_SITE_OTHER)
Admission: RE | Admit: 2016-06-18 | Discharge: 2016-06-18 | Disposition: A | Discharge status: Home / Self Care | Payer: Medicare Other | Source: Ambulatory Visit | Attending: Pulmonary Disease | Admitting: Pulmonary Disease

## 2016-06-18 ENCOUNTER — Encounter: Payer: Self-pay | Admitting: Pulmonary Disease

## 2016-06-18 ENCOUNTER — Ambulatory Visit (INDEPENDENT_AMBULATORY_CARE_PROVIDER_SITE_OTHER): Payer: Medicare Other | Admitting: Pulmonary Disease

## 2016-06-18 VITALS — BP 122/64 | HR 91 | Ht 62.5 in | Wt 158.0 lb

## 2016-06-18 DIAGNOSIS — J849 Interstitial pulmonary disease, unspecified: Secondary | ICD-10-CM

## 2016-06-18 DIAGNOSIS — K769 Liver disease, unspecified: Secondary | ICD-10-CM | POA: Diagnosis not present

## 2016-06-18 MED ORDER — PREDNISONE 20 MG PO TABS: ORAL_TABLET | ORAL | 0 refills | Status: DC

## 2016-06-18 NOTE — Progress Notes (Signed)
Kristina Schroeder    GA:[id number]    June 07, 1941  Primary Care Physician:MCNEILL,WENDY, MD  Referring Physician: Cari Caraway, MD [place], [place] [address]  Chief complaint:  Follow up after recent hospitalization.  HPI: 76 Y/O female never smoker with hx fibromyalgia, chronic LBBB, sinus tachycardia regulated with B blocker and diltiazem, DM, GERD admitted in December 2017 with 2-3 weeks of progressive SOB, much worse with exertion, chest congestion, fatigue and general malaise.  In ER pt was afebrile, mildly hypoxic with sats 88% on RA. WBC, troponin, BNP all within norma range. She had a CT scan of the chest which showed some underlying basal fibrosis and multifocal randomly distributed consolidation with contrast opacities. She underwent a bronchoscope on 05/31/16 (results below). She has been discharged on 20 mg of prednisone and 3 L supplemental oxygen.  She states that her breathing has improved since discharge. She still requires the oxygen and notices desats to the 60s and 70s when she takes off the oxygen. She has chronic cough, sputum production. She has no family history of lung disease. She does not give any signs and symptoms of connective tissue disease or autoimmune process. She works as an Optometrist and has always lived in the [place] no recent travel. She is a never smoker. She has history of breast cancer in 2003 treated with radiation and chemotherapy.   Outpatient Encounter Prescriptions as of 06/18/2016  Medication Sig  . acetaminophen (TYLENOL) 500 MG tablet Take 500-1,000 mg by mouth every 6 (six) hours as needed for headache (or pain).  Marland Kitchen aspirin EC 81 MG tablet Take 81 mg by mouth every other day.  . baclofen (LIORESAL) 10 MG tablet Take 10 mg by mouth 2 (two) times daily as needed for muscle spasms.   Marland Kitchen BIOTIN 5000 PO Take 5,000 mg by mouth daily.   . cetirizine (ZYRTEC) 10 MG tablet Take 10 mg by mouth daily.  . cholecalciferol (VITAMIN D)  1000 UNITS tablet Take 2,000 Units by mouth daily.    . CRESTOR 5 MG tablet TAKE ONE TABLET AT BEDTIME.  Marland Kitchen diltiazem (CARDIZEM CD) 180 MG 24 hr capsule Take 2 capsules (360 mg total) by mouth daily.  . DULoxetine (CYMBALTA) 60 MG capsule Take 1 capsule (60 mg total) by mouth 2 (two) times daily.  Marland Kitchen ezetimibe (ZETIA) 10 MG tablet Take 1 tablet (10 mg total) by mouth daily.  . furosemide (LASIX) 20 MG tablet Take 1 tablet (20 mg total) by mouth daily as needed for fluid or edema. For weight gain of >3Lbs in 1 day.  Marland Kitchen glimepiride (AMARYL) 2 MG tablet Take 2 mg by mouth 2 (two) times daily.  Marland Kitchen guaiFENesin (MUCINEX) 600 MG 12 hr tablet Take 1 tablet (600 mg total) by mouth 2 (two) times daily.  Marland Kitchen ketorolac (ACULAR) 0.5 % ophthalmic solution Place 4 drops into the left eye 4 (four) times daily. Stop date: 05/31/16  . nadolol (CORGARD) 20 MG tablet Take 1 tablet (20 mg total) by mouth daily.  Marland Kitchen ofloxacin (OCUFLOX) 0.3 % ophthalmic solution Place 4 drops into the left eye 4 (four) times daily. Stop date: 05/31/16  . pantoprazole (PROTONIX) 40 MG tablet Take 1 tablet (40 mg total) by mouth 2 (two) times daily before a meal.  . predniSONE (DELTASONE) 20 MG tablet Take 1 tablet (20 mg total) by mouth daily with breakfast.  . traMADol (ULTRAM) 50 MG tablet Take 1 tablet (50 mg total) by  mouth every 6 (six) hours as needed. (Patient taking differently: Take 50 mg by mouth every 6 (six) hours as needed (for pain). )  . Wheat Dextrin (BENEFIBER) POWD Take by mouth See admin instructions. Mix 3 teaspoonsful into juice and drink two times a day  . zolpidem (AMBIEN) 10 MG tablet Take 0.5 tablets (5 mg total) by mouth at bedtime as needed. For sleep (Patient taking differently: Take 5 mg by mouth at bedtime as needed for sleep. )   No facility-administered encounter medications on file as of 06/18/2016.     Allergies as of 06/18/2016 - Review Complete 06/18/2016  Allergen Reaction Noted  . Demerol Nausea And  Vomiting 01/21/2011  . Hydrocodone-acetaminophen Nausea And Vomiting   . Meperidine hcl Nausea And Vomiting   . Morphine Nausea And Vomiting   . Morphine and related Nausea And Vomiting 01/21/2011  . Vioxx [rofecoxib] Nausea And Vomiting 01/21/2011    Past Medical History:  Diagnosis Date  . Allergy   . Bronchitis    acute  . Bundle branch block left   . Cancer [facility])    Breast  . 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 Degenerative joint disease   . Dyslipidemia   . Fibromyalgia   . Gastritis   . GERD (gastroesophageal reflux disease)   . Hyperlipidemia   . IBS (irritable bowel syndrome)   . Left ventricular dysfunction   . Shingles     Past Surgical History:  Procedure Laterality Date  . BREAST SURGERY  12 6 2003  . CARDIAC CATHETERIZATION  08/2008  . CATARACT EXTRACTION  4 2013  . GANGLION CYST EXCISION    . MASTECTOMY, PARTIAL     Status post left   . TONSILLECTOMY    . TONSILLECTOMY  1963  . VIDEO BRONCHOSCOPY Bilateral 05/31/2016   Procedure: VIDEO BRONCHOSCOPY WITH FLUORO;  Surgeon: Marshell Garfinkel, MD;  Location: [facility];  Service: Cardiopulmonary;  Laterality: Bilateral;    Family History  Problem Relation Age of Onset  . Heart disease Mother   . Heart disease Sister   . Diabetes Sister   . Arthritis Father   . Cancer Father   . Crohn's disease Father   . Cancer Paternal Aunt     Social History   Social History  . Marital status: Married    Spouse name: N/A  . Number of children: 1  . Years of education: N/A   Occupational History  . Not on file.   Social History Main Topics  . Smoking status: Never Smoker  . Smokeless tobacco: Never Used  . Alcohol use No  . Drug use: Unknown  . Sexual activity: Not on file   Other Topics Concern  . Not on file   Social History Narrative  . No narrative on file     Review of systems: Review of Systems  Constitutional: Negative for fever and chills.  HENT: Negative.   Eyes:  Negative for blurred vision.  Respiratory: as per HPI  Cardiovascular: Negative for chest pain and palpitations.  Gastrointestinal: Negative for vomiting, diarrhea, blood per rectum. Genitourinary: Negative for dysuria, urgency, frequency and hematuria.  Musculoskeletal: Negative for myalgias, back pain and joint pain.  Skin: Negative for itching and rash.  Neurological: Negative for dizziness, tremors, focal weakness, seizures and loss of consciousness.  Endo/Heme/Allergies: Negative for environmental allergies.  Psychiatric/Behavioral: Negative for depression, suicidal ideas and hallucinations.  All other systems reviewed and are negative.  Physical Exam: Blood pressure 122/64,  pulse 91, height 5' 2.5" (1.588 m), weight 158 lb (71.7 kg), SpO2 90 %. Gen:      No acute distress HEENT:  EOMI, sclera anicteric Neck:     No masses; no thyromegaly Lungs:    Clear to auscultation bilaterally; normal respiratory effort CV:         Regular rate and rhythm; no murmurs Abd:      + bowel sounds; soft, non-tender; no palpable masses, no distension Ext:    No edema; adequate peripheral perfusion Skin:      Warm and dry; no rash Neuro: alert and oriented x 3 Psych: normal mood and affect  Data Reviewed: Serologies 05/29/16 HP panel negative ACE 56 ANA, ANCA, RF, ds DNA, scl 70- negative  Bronch results 05/31/16 Transbronchial biopsies Path 05/31/16 - focal hyalinized fibrosis. Negative for fungal, mycobacterial organisms Microbiology-normal respiratory flora, AFB smear negative, PCP negative. Final AFB and fungal cultures pending BAL cell count-WBC 3-5, eosinophils 11%, neutrophils 14%  CT scan chest 05/29/16-widespread areas of groundglass opacities, hospice consolidation throughout the lung. No specific interstitial changes identified. Multiple attenuation lesions in the liver.  Assessment:  Unspecified ILD Review of her past imaging shows a CT of the abdomen pelvis from 2011 with mild  reticular basilar opacities. She has interstitial infiltrates on chest x-ray dating back to 2016. This appears to be an acute inflammatory process process superimposed on chronic fibrosis. The differential is broad including AE-IPF, infectious etiology, inflammatory process with eosinophilic pneumonias, connective tissue disease, sarcoidosis, hypersensitivity pneumonitis. Interestingly she reports acute exposure [place] 3-5 years ago. Malignancy is also on the differential given her history of breast cancer, mediastinal, hilar lymphadenopathy and liver lesions. She has significant h/o GERD and may have chronic aspiration  Bronchoscope and labs results to date were reviewed with her. Pathology shows focal hyalinized fibrosis which is nonspecific. Micro is negative. She does have slightly elevated eosinophil counts in the BAL but does not quite meet the criteria for eosinophilic pneumonia. She is being empirically treated with prednisone at 20. I'll increase the dose to 1 mg/kg which will be around 60 mg per follow-up. She'll need a follow-up imaging his chest x-ray today and a high-resolution CT in 2-3 weeks time. If her opacities do not improve then I'll press for a surgical lung biopsy for further evaluation.   Liver lesions, GERD Liver lesions noted on CT of the chest. I'll order an MRI of the liver for further evaluation. She has follow-up already arranged with her gastroenterologist Dr. Watt Climes. She will continue on protonix 40 mg bid.  Plan/Recommendations: - Continue supplemental O2 - Increase prednisone dose to 60 mg - CXR today and high res CT follow up in 1 month - Continue protonix 40 mg bid - MRI abdomen.  Marshell Garfinkel MD [facility] Pager [phone] 06/18/2016, 1:41 PM  CC: Cari Caraway, MD   Addendum: I called her on 06/18/16 evening and updated her on the results of the CXR which shows improving opacities.

## 2016-06-18 NOTE — Patient Instructions (Addendum)
We will schedule you for MRI of the abdomen with and without IV gadolinium  You will get a CXR today and we will schedule a High res CT in 3 weeks time. We will increase the prednisone dose to 60 mg/day.  Return to clinic in 3 weeks after the CT scan of chest

## 2016-06-19 ENCOUNTER — Ambulatory Visit (HOSPITAL_COMMUNITY)
Admission: RE | Admit: 2016-06-19 | Discharge: 2016-06-19 | Disposition: A | Discharge status: Home / Self Care | Payer: Medicare Other | Source: Ambulatory Visit | Attending: Pulmonary Disease | Admitting: Pulmonary Disease

## 2016-06-19 DIAGNOSIS — K769 Liver disease, unspecified: Secondary | ICD-10-CM | POA: Insufficient documentation

## 2016-06-19 MED ORDER — GADOBENATE DIMEGLUMINE 529 MG/ML IV SOLN
15.0000 mL | Freq: Once | INTRAVENOUS | Status: AC | PRN
  Administered 2016-06-19: 15 mL via INTRAVENOUS

## 2016-06-20 ENCOUNTER — Telehealth: Payer: Self-pay | Admitting: Pulmonary Disease

## 2016-06-20 DIAGNOSIS — K769 Liver disease, unspecified: Secondary | ICD-10-CM

## 2016-06-20 NOTE — Telephone Encounter (Signed)
I called and spoke with the patient about the MRI liver results that are concerning for metastatic disease. Given her history of breast cancer I feel she will need another biopsy for further eval. I will discuss her case at the tumor board to determine the best approach for biopsy with EBUS vs liver biopsy. In the meantime we will get a PET scan.  Marshell Garfinkel MD [facility] Pager [phone] 06/20/2016, 9:54 AM

## 2016-06-21 ENCOUNTER — Telehealth: Payer: Self-pay | Admitting: Pulmonary Disease

## 2016-06-21 DIAGNOSIS — J9601 Acute respiratory failure with hypoxia: Secondary | ICD-10-CM

## 2016-06-21 NOTE — Telephone Encounter (Signed)
Order placed for POC No sign of the handicap placard as of yet but will continue to watch for this

## 2016-06-21 NOTE — Telephone Encounter (Signed)
[place] is at 7am on Thursdays As it happens, pt's family called to [facility] the PET because pt needed an early morning appt d/t her diabetes and NPO PET is now scheduled for Fri 07/05/16 Discussed this with PM - it would be preferable for pt to have the PET and [place] this week but okay for the 26th; would like to still have pt presented at next week's [place] [facility] if Judson Roch can arrange.  Spoke with Judson Roch, she has placed pt on the list for the 1.18.18 [place] Nothing further needed at this time; will sign off

## 2016-06-21 NOTE — Telephone Encounter (Signed)
PET scan ordered with the request to see if it can be done prior to next Thursday SG is in the office this afternoon, will discuss PM's request Per pt's chart, the PET has been scheduled for 1.18.18 @ 12N - will check to see if this time is appropriate

## 2016-06-21 NOTE — Telephone Encounter (Signed)
Spoke with pt's husband states he has dropped off forms for handicap placard to be filled up- requesting this form be mailed to their home address-verified home address on file.   Will route message to Janett Billow to follow up on handicap form.    Regarding POC- pt's husband is checking the status of poc order.  Pt's husband states this was discussed in 06/18/16 office visit, although no order was placed for POC and I do not see a POC documented in office visit note. PM please advise if ok to order POC.  Thanks.

## 2016-06-21 NOTE — Telephone Encounter (Signed)
Ok to order POC. Thanks  [employer]

## 2016-06-25 ENCOUNTER — Telehealth: Payer: Self-pay | Admitting: Pulmonary Disease

## 2016-06-25 NOTE — Telephone Encounter (Signed)
I called and updated the patient again today about the findings to date including the CT scan, MRI, blood work, labs and micro results. I told her that I would be discussing the case at tumor board this week so we can have a plan moving forward to determine the etiology of the lung, liver lesions. She is anxious that we do not have a firm diagnosis yet and said that if she were to have another biopsy then she would want to get a second opinion first. She will try to get an appointment at [facility]. I will be updating her on the results of the tumor board discussion.  Marshell Garfinkel MD [facility] Pager [phone] If no answer or after 3pm call: [phone] 06/25/2016, 5:48 PM

## 2016-06-25 NOTE — Telephone Encounter (Signed)
Spoke with pt, who states she would like PM to contact her to go over results. Pt states she hasn't heard anything about her CT that was performed during addmission, biopsy, and MRI results. Pt also states she was given the wrong AVS at her last OV on 06-18-16. Pt states she has thrown this away. I apologized to pt about this and asked pt if she remembered who had let her go, pt could not recall. Will route to [place] to follow up on.  Will route to PM to address results with pt per pt request. Thanks.

## 2016-06-25 NOTE — Telephone Encounter (Signed)
I have updated her multiple times about all the results to date. See clinic note from 1/9 and phone note from 1/11. I am happy to call her back and re update her if she wants.   Kristina Schroeder

## 2016-06-25 NOTE — Telephone Encounter (Signed)
Jess please update on the handicap placard, has this been received. thanks

## 2016-06-25 NOTE — Telephone Encounter (Signed)
Spoke with pt. She is very upset that she has not heard back about her results. Pt had blood work done in the hospital on 05/31/16, a lung biopsy done on 05/31/16, CT done on 05/29/16 and an MRI done on 06/19/16. She has not received the results on any of these tests. Pt demands a call from Dr. Vaughan Browner TODAY.  PM - please advise. Thanks.

## 2016-06-27 ENCOUNTER — Ambulatory Visit (HOSPITAL_COMMUNITY): Payer: Medicare Other

## 2016-06-27 NOTE — Telephone Encounter (Signed)
I called and informed the patient that unfortunately the tumor board has been cancelled today due to inclement weather. We will discuss her case next week on 1/25. She has a PET scan scheduled for 1/26 with will give [place] more information as well. She understands the reason for the delay.  Marshell Garfinkel MD [facility] Pager [phone] 06/27/2016, 10:46 AM

## 2016-06-28 NOTE — Telephone Encounter (Signed)
I have checked PM's look at. I did not see this handicap placard. Since JJ is not here to ask if this has been received, message will be routed back to her for follow up.

## 2016-07-01 NOTE — Telephone Encounter (Signed)
Handicap placard received and filled out to the best of my ability, placed in PM's red folder to be signed when he returns to the office next week.  LMOM TCB x1 to let pt know PM is not in the office again until next week.

## 2016-07-02 NOTE — Telephone Encounter (Signed)
Spoke with pt and made her aware of below message.  Pt voiced her understanding and had no further questions.  Will route to [place] to contact pt once complete. Thanks.

## 2016-07-02 NOTE — Telephone Encounter (Signed)
lmtcb x2 for pt. 

## 2016-07-02 NOTE — Telephone Encounter (Signed)
Regarding PHI violation, an [employer] has been completed. Nothing further is needed regarding this.

## 2016-07-02 NOTE — Telephone Encounter (Signed)
PM is at [facility] hospital all week Was able to communicate with him, he will try to come by the office today or tomorrow afternoon.  Thanks.

## 2016-07-02 NOTE — Telephone Encounter (Signed)
Pt returned call. Informed her that we are waiting for PM to sign the handicap placard. Pt is very frustrated this has not been done yet.   Spoke with Jess and will see if PM is able to come by office to sign form prior to having clinic next week.  Will forward to Jess to follow up on.

## 2016-07-03 LAB — FUNGAL ORGANISM REFLEX

## 2016-07-03 LAB — FUNGUS CULTURE WITH STAIN

## 2016-07-03 LAB — FUNGUS CULTURE RESULT

## 2016-07-03 NOTE — Telephone Encounter (Signed)
Handicap placard has been signed by PM LMOM TCB x1 for spouse Richard LMOM TCB x1 on home phone for pt  Just need to know if she would like to pick this up or have it mailed to her home.

## 2016-07-04 NOTE — Telephone Encounter (Signed)
Pt called back and stated that she would like the placard mailed to her home address and this was verified by [place].  Will forward to Jess to make her aware.

## 2016-07-04 NOTE — Telephone Encounter (Signed)
Handicap placard placed in outgoing mail to home address on file Nothing further needed; will sign off

## 2016-07-04 NOTE — Telephone Encounter (Signed)
Patient returned call. She asked that we mail the placard to her home address on file (verified).

## 2016-07-04 NOTE — Telephone Encounter (Signed)
I called and told the patient that we discussed her case at Tumor board today. We agree that her lung findings are consistent with acute inflammatory process such as COP, eosinophilic PNA in a background of UIP fibrosis. The liver lesions look like they are not related to the lung process. There is a suspicion of malignancy. I have recommended that she get a liver biopsy. She has a gastroenterologist Mr. Watt Climes who she follows closely. She would prefer to consult him and have him make a referral for the biopsy. She will follow in our clinic for pulmonary issues.  Marshell Garfinkel MD [facility] Pager [phone] If no answer or after 3pm call: [phone] 07/04/2016, 3:05 PM

## 2016-07-04 NOTE — Telephone Encounter (Signed)
lmtcb x2 for pt. 

## 2016-07-05 ENCOUNTER — Ambulatory Visit (HOSPITAL_COMMUNITY)
Admission: RE | Admit: 2016-07-05 | Discharge: 2016-07-05 | Disposition: A | Discharge status: Home / Self Care | Payer: Medicare Other | Source: Ambulatory Visit | Attending: Pulmonary Disease | Admitting: Pulmonary Disease

## 2016-07-05 ENCOUNTER — Encounter (HOSPITAL_COMMUNITY): Payer: Self-pay

## 2016-07-05 ENCOUNTER — Telehealth: Payer: Self-pay | Admitting: Pulmonary Disease

## 2016-07-05 DIAGNOSIS — K769 Liver disease, unspecified: Secondary | ICD-10-CM | POA: Insufficient documentation

## 2016-07-05 LAB — GLUCOSE, CAPILLARY: GLUCOSE-CAPILLARY: 311 mg/dL — AB (ref 65–99)

## 2016-07-05 NOTE — Telephone Encounter (Signed)
Spoke with Leigh at [facility], who states pt was unable to have PET scan this morning, due to her glucose being 311.  Will route to PM to make him aware.

## 2016-07-08 ENCOUNTER — Telehealth: Payer: Self-pay | Admitting: Pulmonary Disease

## 2016-07-08 DIAGNOSIS — J849 Interstitial pulmonary disease, unspecified: Secondary | ICD-10-CM

## 2016-07-08 NOTE — Telephone Encounter (Signed)
Spoke with pt, she states she thought she had to perform a MRI first before the CT scan. She states she did not know she had to do this, I read her the plan from the [place]. She states she needs to know in advance, the schedulers just called her today and it caught her off guard. I advised her to call back to reschedule because PM wanted this done. Further workup and treatment could be done if symptoms persist, worsen or new related symptoms occur. The patient will call in that eventuality.

## 2016-07-09 ENCOUNTER — Inpatient Hospital Stay: Admission: RE | Admit: 2016-07-09 | Payer: Medicare Other | Source: Ambulatory Visit

## 2016-07-10 DIAGNOSIS — J849 Interstitial pulmonary disease, unspecified: Secondary | ICD-10-CM | POA: Insufficient documentation

## 2016-07-10 NOTE — Telephone Encounter (Signed)
I called and spoke with the patient today. She was unaware that we are getting the CT of the chest so it got cancelled. Can we please reschedule it before my office visit on feb 9th.  Please order a high res CT of the chest for ILD. Specify in comments that the patient is being treated with prednisone.   Other issues that she updated me was that she saw her GI doctor and was scheduled for liver biopsy.  PET scan got cancelled due to high sugars. Her PCP is working on better control of the sugars and getting the PET scan done.  PM

## 2016-07-10 NOTE — Telephone Encounter (Signed)
Order sent to [facility] with specific instructions as listed below

## 2016-07-10 NOTE — Addendum Note (Signed)
Addended by: Rosana Berger on: 07/10/2016 09:48 AM   Modules accepted: Orders

## 2016-07-18 ENCOUNTER — Ambulatory Visit (INDEPENDENT_AMBULATORY_CARE_PROVIDER_SITE_OTHER)
Admission: RE | Admit: 2016-07-18 | Discharge: 2016-07-18 | Disposition: A | Discharge status: Home / Self Care | Payer: Medicare Other | Source: Ambulatory Visit | Attending: Pulmonary Disease | Admitting: Pulmonary Disease

## 2016-07-18 DIAGNOSIS — J849 Interstitial pulmonary disease, unspecified: Secondary | ICD-10-CM | POA: Diagnosis not present

## 2016-07-18 LAB — ACID FAST CULTURE WITH REFLEXED SENSITIVITIES

## 2016-07-18 LAB — ACID FAST CULTURE WITH REFLEXED SENSITIVITIES (MYCOBACTERIA): Acid Fast Culture: NEGATIVE

## 2016-07-19 ENCOUNTER — Ambulatory Visit (INDEPENDENT_AMBULATORY_CARE_PROVIDER_SITE_OTHER): Payer: Medicare Other | Admitting: Pulmonary Disease

## 2016-07-19 ENCOUNTER — Encounter: Payer: Self-pay | Admitting: Pulmonary Disease

## 2016-07-19 VITALS — BP 116/62 | HR 71 | Ht 62.5 in | Wt 151.2 lb

## 2016-07-19 DIAGNOSIS — J849 Interstitial pulmonary disease, unspecified: Secondary | ICD-10-CM | POA: Diagnosis not present

## 2016-07-19 MED ORDER — PREDNISONE 10 MG PO TABS: ORAL_TABLET | ORAL | 0 refills | Status: DC

## 2016-07-19 NOTE — Progress Notes (Addendum)
Kristina Schroeder    [id number]    07-Jun-1941  Primary Care Physician:Schroeder,WENDY, MD  Referring Physician: Cari Caraway, MD [place], [place] [id number]  Chief complaint:  Follow up for  Diffuse up specified ILD  HPI: 76 Y/O female never smoker with hx fibromyalgia, chronic LBBB, sinus tachycardia regulated with B blocker and diltiazem, DM, GERD admitted in December 2017 with 2-3 weeks of progressive SOB, much worse with exertion, chest congestion, fatigue and general malaise.  In ER pt was afebrile, mildly hypoxic with sats 88% on RA. WBC, troponin, BNP all within norma range. She had a CT scan of the chest which showed some underlying basal fibrosis and multifocal randomly distributed consolidation with contrast opacities. She underwent a bronchoscope on 05/31/16 (results below). She has been discharged on 20 mg of prednisone and 3 L supplemental oxygen.  She states that her breathing has improved since discharge. She still requires the oxygen and notices desats to the 60s and 70s when she takes off the oxygen. She has chronic cough, sputum production. She has no family history of lung disease. She does not give any signs and symptoms of connective tissue disease or autoimmune process. She works as an Optometrist and has always lived in [place] with no recent travel. She is a never smoker. She has history of breast cancer in 2003 treated with radiation and chemotherapy.   Interim history: As last visit her prednisone was increased to 60 mg. She has been on this for one month and notices a slight improvement in symptoms but she continues to require supplemental oxygen due to hypoxia. She had a repeat CT scan yesterday which showed waxing and waning diffuse pulmonary infiltrates with consolidation, ground glass opacities.   The case was also discussed at multidisciplinary tumor board on 07/05/15. It was felt that her lung opacities represent an inflammatory interstitial  process. She also has liver lesions concerning for malignancy which is thought to be separate from her lung findings. I have recommended her to get a CT-guided biopsy of the liver. She has followed up with her GI doctor Dr. Earlean Schroeder who concurs and is trying to arrange this biopsy. She was due to get a PET scan but had to be cancelled because of hyperglycemia.   Outpatient Encounter Prescriptions as of 07/19/2016  Medication Sig  . acetaminophen (TYLENOL) 500 MG tablet Take 500-1,000 mg by mouth every 6 (six) hours as needed for headache (or pain).  Marland Kitchen aspirin EC 81 MG tablet Take 81 mg by mouth every other day.  . baclofen (LIORESAL) 10 MG tablet Take 10 mg by mouth 2 (two) times daily as needed for muscle spasms.   Marland Kitchen BIOTIN 5000 PO Take 5,000 mg by mouth daily.   . cetirizine (ZYRTEC) 10 MG tablet Take 10 mg by mouth daily.  . cholecalciferol (VITAMIN D) 1000 UNITS tablet Take 2,000 Units by mouth daily.    . CRESTOR 5 MG tablet TAKE ONE TABLET AT BEDTIME.  Marland Kitchen diltiazem (CARDIZEM CD) 180 MG 24 hr capsule Take 2 capsules (360 mg total) by mouth daily.  . DULoxetine (CYMBALTA) 60 MG capsule Take 1 capsule (60 mg total) by mouth 2 (two) times daily.  Marland Kitchen ezetimibe (ZETIA) 10 MG tablet Take 1 tablet (10 mg total) by mouth daily.  . furosemide (LASIX) 20 MG tablet Take 1 tablet (20 mg total) by mouth daily as needed for fluid or edema. For weight gain of >3Lbs in 1  day.  . glimepiride (AMARYL) 2 MG tablet Take 2 mg by mouth 2 (two) times daily.  Marland Kitchen guaiFENesin (MUCINEX) 600 MG 12 hr tablet Take 1 tablet (600 mg total) by mouth 2 (two) times daily.  Marland Kitchen ketorolac (ACULAR) 0.5 % ophthalmic solution Place 4 drops into the left eye 4 (four) times daily. Stop date: 05/31/16  . nadolol (CORGARD) 20 MG tablet Take 1 tablet (20 mg total) by mouth daily.  Marland Kitchen ofloxacin (OCUFLOX) 0.3 % ophthalmic solution Place 4 drops into the left eye 4 (four) times daily. Stop date: 05/31/16  . pantoprazole (PROTONIX) 40 MG tablet Take  1 tablet (40 mg total) by mouth 2 (two) times daily before a meal.  . predniSONE (DELTASONE) 20 MG tablet Take 3 pills daily  . traMADol (ULTRAM) 50 MG tablet Take 1 tablet (50 mg total) by mouth every 6 (six) hours as needed. (Patient taking differently: Take 50 mg by mouth every 6 (six) hours as needed (for pain). )  . Wheat Dextrin (BENEFIBER) POWD Take by mouth See admin instructions. Mix 3 teaspoonsful into juice and drink two times a day  . zolpidem (AMBIEN) 10 MG tablet Take 0.5 tablets (5 mg total) by mouth at bedtime as needed. For sleep (Patient taking differently: Take 5 mg by mouth at bedtime as needed for sleep. )   No facility-administered encounter medications on file as of 07/19/2016.     Allergies as of 07/19/2016 - Review Complete 07/19/2016  Allergen Reaction Noted  . Demerol Nausea And Vomiting 01/21/2011  . Hydrocodone-acetaminophen Nausea And Vomiting   . Meperidine hcl Nausea And Vomiting   . Morphine Nausea And Vomiting   . Morphine and related Nausea And Vomiting 01/21/2011  . Vioxx [rofecoxib] Nausea And Vomiting 01/21/2011    Past Medical History:  Diagnosis Date  . Allergy   . Bronchitis    acute  . Bundle branch block left   . Cancer [facility])    Breast  . 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 Degenerative joint disease   . Dyslipidemia   . Fibromyalgia   . Gastritis   . GERD (gastroesophageal reflux disease)   . Hyperlipidemia   . IBS (irritable bowel syndrome)   . Left ventricular dysfunction   . Shingles     Past Surgical History:  Procedure Laterality Date  . BREAST SURGERY  12 6 2003  . CARDIAC CATHETERIZATION  08/2008  . CATARACT EXTRACTION  4 2013  . GANGLION CYST EXCISION    . MASTECTOMY, PARTIAL     Status post left   . TONSILLECTOMY    . TONSILLECTOMY  1963  . VIDEO BRONCHOSCOPY Bilateral 05/31/2016   Procedure: VIDEO BRONCHOSCOPY WITH FLUORO;  Surgeon: Marshell Garfinkel, MD;  Location: [place];  Service:  Cardiopulmonary;  Laterality: Bilateral;    Family History  Problem Relation Age of Onset  . Heart disease Mother   . Heart disease Sister   . Diabetes Sister   . Arthritis Father   . Cancer Father   . Crohn's disease Father   . Cancer Paternal Aunt     Social History   Social History  . Marital status: Married    Spouse name: N/A  . Number of children: 1  . Years of education: N/A   Occupational History  . Not on file.   Social History Main Topics  . Smoking status: Never Smoker  . Smokeless tobacco: Never Used  . Alcohol use No  . Drug use:  Unknown  . Sexual activity: Not on file   Other Topics Concern  . Not on file   Social History Narrative  . No narrative on file   Review of systems: Review of Systems  Constitutional: Negative for fever and chills.  HENT: Negative.   Eyes: Negative for blurred vision.  Respiratory: as per HPI  Cardiovascular: Negative for chest pain and palpitations.  Gastrointestinal: Negative for vomiting, diarrhea, blood per rectum. Genitourinary: Negative for dysuria, urgency, frequency and hematuria.  Musculoskeletal: Negative for myalgias, back pain and joint pain.  Skin: Negative for itching and rash.  Neurological: Negative for dizziness, tremors, focal weakness, seizures and loss of consciousness.  Endo/Heme/Allergies: Negative for environmental allergies.  Psychiatric/Behavioral: Negative for depression, suicidal ideas and hallucinations.  All other systems reviewed and are negative.  Physical Exam: Blood pressure 116/62, pulse 71, height 5' 2.5" (1.588 m), weight 151 lb 3.2 oz (68.6 kg), SpO2 91 %. Gen:      No acute distress HEENT:  EOMI, sclera anicteric Neck:     No masses; no thyromegaly Lungs:    B/L crackles; normal respiratory effort CV:         Regular rate and rhythm; no murmurs Abd:      + bowel sounds; soft, non-tender; no palpable masses, no distension Ext:    No edema; adequate peripheral perfusion Skin:       Warm and dry; no rash Neuro: alert and oriented x 3 Psych: normal mood and affect  Data Reviewed: Serologies 05/29/16 HP panel negative ACE 56 ANA, ANCA, RF, ds DNA, scl 70, GBM- negative  Bronch results 05/31/16 Transbronchial biopsies Path 05/31/16 - focal hyalinized fibrosis. Negative for fungal, mycobacterial organisms Microbiology-normal respiratory flora, AFB smear negative, PCP negative. Final AFB and fungal cultures pending BAL cell count-WBC 3-5, eosinophils 11%, neutrophils 14%  Imaging CT scan chest 05/29/16-widespread areas of groundglass opacities, consolidation throughout the lung. No specific interstitial pattern identified. Suspected underlying pulmonary fibrosis. Multiple attenuation lesions in the liver.  High resolution CT of the chest 07/18/16- persistent patchy irregular areas of consolidation and groundglass opacities throughout the lungs. Some of which are improved and some worsened compared to previous CT scan. Underlying pulmonary fibrosis abd multiple attenuation lesions in the liver again identified. I have reviewed all images personally.  Assessment:  Unspecified ILD Review of her past imaging shows a CT of the abdomen pelvis from 2011 with mild reticular basilar opacities. She has interstitial infiltrates on chest x-ray dating back to 2016. This appears to be an acute inflammatory process process superimposed on chronic fibrosis. The differential is broad including AE-IPF, inflammatory process with eosinophilic pneumonias,  COP, hypersensitivity PNA, sarcoidosis. Myositis needs to be evaluated given her progressive muscle weakness. Malignancy is also on the differential given her history of breast cancer, mediastinal, hilar lymphadenopathy and liver lesions.   Bronchoscope and labs results to date were re reviewed with her. Pathology shows focal hyalinized fibrosis which is nonspecific. Micro is negative. She does have slightly elevated eosinophil counts in the  BAL but does not quite meet the criteria for eosinophilic pneumonia. She is being empirically treated with prednisone at 60 mg with no significant improvement in lesions. At this point since the prednisone is not helping she would need to get an surgical lung biopsy to get a definitive diagnosis.I would not increase the prednisone dose as she is already having issues with hyperglycemia and will start a slow taper.   I have discussed this with the patient her husband  and daughter in the office today. They prefer that she get second opinion, probably at [facility]. I will make the referral.  Liver lesions, GERD Liver lesions noted on MRI. She has a liver biopsy pending (ordered by Dr. Earlean Schroeder)  Plan/Recommendations: - Continue supplemental O2 - Follow up on liver biopsy - Start slow pred taper - Referral for second opinion.  Marshell Garfinkel MD [facility] Pager [phone] 07/19/2016, 3:00 PM  CC: Kristina Caraway, MD

## 2016-07-19 NOTE — Patient Instructions (Signed)
We will start tapering the steroids. Reduce dose to 50 mg today and then start reducing the dose to 10 mg every 3 days. I will make a referral to [facility] for further eval of your lung issues. Please let me know the results of your liver biopsy.  Return in 2 months.

## 2016-07-20 ENCOUNTER — Encounter (HOSPITAL_COMMUNITY): Payer: Self-pay | Admitting: Emergency Medicine

## 2016-07-20 ENCOUNTER — Inpatient Hospital Stay (HOSPITAL_COMMUNITY)
Admission: EM | Admit: 2016-07-20 | Discharge: 2016-07-30 | DRG: 196 | Disposition: A | Discharge status: Hospice | Payer: Medicare Other | Attending: Internal Medicine | Admitting: Internal Medicine

## 2016-07-20 ENCOUNTER — Emergency Department (HOSPITAL_COMMUNITY): Payer: Medicare Other

## 2016-07-20 DIAGNOSIS — E1122 Type 2 diabetes mellitus with diabetic chronic kidney disease: Secondary | ICD-10-CM | POA: Diagnosis present

## 2016-07-20 DIAGNOSIS — J849 Interstitial pulmonary disease, unspecified: Secondary | ICD-10-CM | POA: Diagnosis present

## 2016-07-20 DIAGNOSIS — Z7189 Other specified counseling: Secondary | ICD-10-CM

## 2016-07-20 DIAGNOSIS — N390 Urinary tract infection, site not specified: Secondary | ICD-10-CM | POA: Diagnosis present

## 2016-07-20 DIAGNOSIS — I272 Pulmonary hypertension, unspecified: Secondary | ICD-10-CM | POA: Diagnosis present

## 2016-07-20 DIAGNOSIS — R55 Syncope and collapse: Secondary | ICD-10-CM | POA: Diagnosis not present

## 2016-07-20 DIAGNOSIS — J9621 Acute and chronic respiratory failure with hypoxia: Secondary | ICD-10-CM | POA: Diagnosis present

## 2016-07-20 DIAGNOSIS — Z515 Encounter for palliative care: Secondary | ICD-10-CM

## 2016-07-20 DIAGNOSIS — J9690 Respiratory failure, unspecified, unspecified whether with hypoxia or hypercapnia: Secondary | ICD-10-CM | POA: Diagnosis present

## 2016-07-20 DIAGNOSIS — IMO0002 Reserved for concepts with insufficient information to code with codable children: Secondary | ICD-10-CM

## 2016-07-20 DIAGNOSIS — J8489 Other specified interstitial pulmonary diseases: Secondary | ICD-10-CM | POA: Diagnosis present

## 2016-07-20 DIAGNOSIS — Z853 Personal history of malignant neoplasm of breast: Secondary | ICD-10-CM

## 2016-07-20 DIAGNOSIS — M797 Fibromyalgia: Secondary | ICD-10-CM | POA: Diagnosis present

## 2016-07-20 DIAGNOSIS — Z66 Do not resuscitate: Secondary | ICD-10-CM | POA: Diagnosis not present

## 2016-07-20 DIAGNOSIS — I129 Hypertensive chronic kidney disease with stage 1 through stage 4 chronic kidney disease, or unspecified chronic kidney disease: Secondary | ICD-10-CM | POA: Diagnosis present

## 2016-07-20 DIAGNOSIS — N183 Chronic kidney disease, stage 3 (moderate): Secondary | ICD-10-CM | POA: Diagnosis present

## 2016-07-20 DIAGNOSIS — K7689 Other specified diseases of liver: Secondary | ICD-10-CM | POA: Diagnosis present

## 2016-07-20 DIAGNOSIS — Z794 Long term (current) use of insulin: Secondary | ICD-10-CM

## 2016-07-20 DIAGNOSIS — E1165 Type 2 diabetes mellitus with hyperglycemia: Secondary | ICD-10-CM | POA: Diagnosis present

## 2016-07-20 DIAGNOSIS — T502X5A Adverse effect of carbonic-anhydrase inhibitors, benzothiadiazides and other diuretics, initial encounter: Secondary | ICD-10-CM | POA: Diagnosis not present

## 2016-07-20 DIAGNOSIS — I951 Orthostatic hypotension: Secondary | ICD-10-CM | POA: Diagnosis not present

## 2016-07-20 DIAGNOSIS — M1990 Unspecified osteoarthritis, unspecified site: Secondary | ICD-10-CM | POA: Diagnosis present

## 2016-07-20 DIAGNOSIS — Z79899 Other long term (current) drug therapy: Secondary | ICD-10-CM

## 2016-07-20 DIAGNOSIS — K219 Gastro-esophageal reflux disease without esophagitis: Secondary | ICD-10-CM | POA: Diagnosis present

## 2016-07-20 DIAGNOSIS — J8410 Pulmonary fibrosis, unspecified: Secondary | ICD-10-CM | POA: Diagnosis not present

## 2016-07-20 DIAGNOSIS — I4460 Unspecified fascicular block: Secondary | ICD-10-CM | POA: Diagnosis present

## 2016-07-20 DIAGNOSIS — E119 Type 2 diabetes mellitus without complications: Secondary | ICD-10-CM

## 2016-07-20 DIAGNOSIS — Z9981 Dependence on supplemental oxygen: Secondary | ICD-10-CM

## 2016-07-20 DIAGNOSIS — Z7982 Long term (current) use of aspirin: Secondary | ICD-10-CM

## 2016-07-20 DIAGNOSIS — E876 Hypokalemia: Secondary | ICD-10-CM | POA: Diagnosis not present

## 2016-07-20 DIAGNOSIS — R262 Difficulty in walking, not elsewhere classified: Secondary | ICD-10-CM

## 2016-07-20 DIAGNOSIS — F419 Anxiety disorder, unspecified: Secondary | ICD-10-CM | POA: Diagnosis not present

## 2016-07-20 DIAGNOSIS — J9601 Acute respiratory failure with hypoxia: Secondary | ICD-10-CM | POA: Diagnosis not present

## 2016-07-20 DIAGNOSIS — E785 Hyperlipidemia, unspecified: Secondary | ICD-10-CM | POA: Diagnosis present

## 2016-07-20 LAB — CBC WITH DIFFERENTIAL/PLATELET
Basophils Absolute: 0 10*3/uL (ref 0.0–0.1)
Basophils Relative: 0 %
EOS ABS: 0.1 10*3/uL (ref 0.0–0.7)
EOS PCT: 1 %
HCT: 42.2 % (ref 36.0–46.0)
HEMOGLOBIN: 14 g/dL (ref 12.0–15.0)
LYMPHS ABS: 1.1 10*3/uL (ref 0.7–4.0)
Lymphocytes Relative: 10 %
MCH: 28.3 pg (ref 26.0–34.0)
MCHC: 33.2 g/dL (ref 30.0–36.0)
MCV: 85.4 fL (ref 78.0–100.0)
MONO ABS: 0.8 10*3/uL (ref 0.1–1.0)
MONOS PCT: 6 %
NEUTROS PCT: 83 %
Neutro Abs: 10.1 10*3/uL — ABNORMAL HIGH (ref 1.7–7.7)
Platelets: 196 10*3/uL (ref 150–400)
RBC: 4.94 MIL/uL (ref 3.87–5.11)
RDW: 15 % (ref 11.5–15.5)
WBC: 12 10*3/uL — ABNORMAL HIGH (ref 4.0–10.5)

## 2016-07-20 LAB — COMPREHENSIVE METABOLIC PANEL
ALK PHOS: 82 U/L (ref 38–126)
ALT: 23 U/L (ref 14–54)
ANION GAP: 9 (ref 5–15)
AST: 23 U/L (ref 15–41)
Albumin: 2.7 g/dL — ABNORMAL LOW (ref 3.5–5.0)
BUN: 8 mg/dL (ref 6–20)
CALCIUM: 8 mg/dL — AB (ref 8.9–10.3)
CO2: 26 mmol/L (ref 22–32)
Chloride: 98 mmol/L — ABNORMAL LOW (ref 101–111)
Creatinine, Ser: 0.64 mg/dL (ref 0.44–1.00)
GFR calc non Af Amer: >60 mL/min (ref >60)
Glucose, Bld: 257 mg/dL — ABNORMAL HIGH (ref 65–99)
POTASSIUM: 3.5 mmol/L (ref 3.5–5.1)
SODIUM: 133 mmol/L — AB (ref 135–145)
TOTAL PROTEIN: 5.5 g/dL — AB (ref 6.5–8.1)
Total Bilirubin: 0.8 mg/dL (ref 0.3–1.2)

## 2016-07-20 LAB — URINALYSIS, ROUTINE W REFLEX MICROSCOPIC
Bilirubin Urine: NEGATIVE
Ketones, ur: 5 mg/dL — AB
Nitrite: NEGATIVE
Protein, ur: NEGATIVE mg/dL
SPECIFIC GRAVITY, URINE: 1.006 (ref 1.005–1.030)
pH: 7 (ref 5.0–8.0)

## 2016-07-20 LAB — I-STAT ARTERIAL BLOOD GAS, ED
Acid-Base Excess: 1 mmol/L (ref 0.0–2.0)
Bicarbonate: 25.8 mmol/L (ref 20.0–28.0)
O2 Saturation: 89 %
PCO2 ART: 42.1 mmHg (ref 32.0–48.0)
PH ART: 7.395 (ref 7.350–7.450)
Patient temperature: 98.6
TCO2: 27 mmol/L (ref 0–100)
pO2, Arterial: 56 mmHg — ABNORMAL LOW (ref 83.0–108.0)

## 2016-07-20 LAB — CBG MONITORING, ED: GLUCOSE-CAPILLARY: 247 mg/dL — AB (ref 65–99)

## 2016-07-20 LAB — I-STAT CG4 LACTIC ACID, ED: Lactic Acid, Venous: 1.6 mmol/L (ref 0.5–1.9)

## 2016-07-20 LAB — INFLUENZA PANEL BY PCR (TYPE A & B)
Influenza A By PCR: NEGATIVE
Influenza B By PCR: NEGATIVE

## 2016-07-20 LAB — I-STAT TROPONIN, ED: Troponin i, poc: 0 ng/mL (ref 0.00–0.08)

## 2016-07-20 LAB — PROCALCITONIN: Procalcitonin: 0.1 ng/mL

## 2016-07-20 LAB — BRAIN NATRIURETIC PEPTIDE: B NATRIURETIC PEPTIDE 5: 111.6 pg/mL — AB (ref 0.0–100.0)

## 2016-07-20 MED ORDER — SODIUM CHLORIDE 0.9 % IV SOLN
INTRAVENOUS | Status: DC
  Administered 2016-07-20 – 2016-07-21 (×2): via INTRAVENOUS

## 2016-07-20 NOTE — ED Provider Notes (Signed)
[place] DEPT Provider Note   CSN: [id number] Arrival date & time: 07/20/16  [place]     History   Chief Complaint Chief Complaint  Patient presents with  . Loss of Consciousness  . Weakness  . Dizziness    HPI Kristina Schroeder is a 76 y.o. female.  The history is provided by the patient and a relative.  Loss of Consciousness   This is a new problem. The current episode started 1 to 2 hours ago. The problem occurs constantly. The problem has been resolved. She lost consciousness for a period of greater than 5 minutes. The problem is associated with normal activity. Associated symptoms include light-headedness. Associated symptoms comments: Dyspnea. She has tried nothing for the symptoms. Past medical history comments: ILD.    Past Medical History:  Diagnosis Date  . Allergy   . Bronchitis    acute  . Bundle branch block left   . Cancer [facility])    Breast  . 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 Degenerative joint disease   . Dyslipidemia   . Fibromyalgia   . Gastritis   . GERD (gastroesophageal reflux disease)   . Hyperlipidemia   . IBS (irritable bowel syndrome)   . Left ventricular dysfunction   . Shingles     Patient Active Problem List   Diagnosis Date Noted  . ILD (interstitial lung disease) ([place]) 07/10/2016  . Hypoxia 05/29/2016  . Type 2 diabetes mellitus without complication, without long-term current use of insulin ([place]) 05/29/2016  . Acute hypoxemic respiratory failure ([place]) 05/29/2016  . Bronchitis 03/02/2012  . Sinusitis acute 10/31/2011  . Hyperlipidemia 10/15/2011  . Fibromyalgia 10/15/2011  . IBS (irritable bowel syndrome) 10/15/2011  . GERD (gastroesophageal reflux disease) 10/15/2011  . Inappropriate sinus node tachycardia 09/26/2008  . CARCINOMA IN SITU OF BREAST 09/24/2008  . LEFT BUNDLE BRANCH HEMIBLOCK 09/24/2008  . LEFT VENTRICULAR FUNCTION, DECREASED 09/24/2008    Past Surgical History:  Procedure Laterality Date  .  BREAST SURGERY  12 6 2003  . CARDIAC CATHETERIZATION  08/2008  . CATARACT EXTRACTION  4 2013  . GANGLION CYST EXCISION    . MASTECTOMY, PARTIAL     Status post left   . TONSILLECTOMY    . TONSILLECTOMY  1963  . VIDEO BRONCHOSCOPY Bilateral 05/31/2016   Procedure: VIDEO BRONCHOSCOPY WITH FLUORO;  Surgeon: Marshell Garfinkel, MD;  Location: [place];  Service: Cardiopulmonary;  Laterality: Bilateral;    OB History    No data available       Home Medications    Prior to Admission medications   Medication Sig Start Date End Date Taking? Authorizing Provider  acetaminophen (TYLENOL) 500 MG tablet Take 500-1,000 mg by mouth every 6 (six) hours as needed for headache (or pain).   Yes Historical Provider, MD  aspirin EC 81 MG tablet Take 81 mg by mouth every other day.   Yes Historical Provider, MD  baclofen (LIORESAL) 10 MG tablet Take 10 mg by mouth 2 (two) times daily as needed for muscle spasms.  01/05/14  Yes Historical Provider, MD  BIOTIN 5000 PO Take 5,000 mg by mouth daily.    Yes Historical Provider, MD  cetirizine (ZYRTEC) 10 MG tablet Take 10 mg by mouth daily.   Yes Historical Provider, MD  cholecalciferol (VITAMIN D) 1000 UNITS tablet Take 2,000 Units by mouth daily.     Yes Historical Provider, MD  diltiazem (CARDIZEM CD) 180 MG 24 hr capsule Take 2 capsules (360 mg  total) by mouth daily. 05/20/16  Yes Peter M Martinique, MD  DULoxetine (CYMBALTA) 60 MG capsule Take 1 capsule (60 mg total) by mouth 2 (two) times daily. 02/04/12  Yes Midge Minium, MD  ezetimibe (ZETIA) 10 MG tablet Take 1 tablet (10 mg total) by mouth daily. Patient taking differently: Take 10 mg by mouth at bedtime.  06/07/16  Yes Peter M Martinique, MD  furosemide (LASIX) 20 MG tablet Take 1 tablet (20 mg total) by mouth daily as needed for fluid or edema. For weight gain of >3Lbs in 1 day. 06/02/16  Yes Lavina Hamman, MD  glimepiride (AMARYL) 2 MG tablet Take 2 mg by mouth 2 (two) times daily. 05/22/16  Yes  Historical Provider, MD  guaiFENesin (MUCINEX) 600 MG 12 hr tablet Take 1 tablet (600 mg total) by mouth 2 (two) times daily. 06/02/16  Yes Lavina Hamman, MD  Insulin Glargine (TOUJEO SOLOSTAR) 300 UNIT/ML SOPN Inject 10 Units into the skin at bedtime.   Yes Historical Provider, MD  nadolol (CORGARD) 20 MG tablet Take 1 tablet (20 mg total) by mouth daily. 12/25/15  Yes Peter M Martinique, MD  pantoprazole (PROTONIX) 40 MG tablet Take 1 tablet (40 mg total) by mouth 2 (two) times daily before a meal. 06/02/16  Yes Lavina Hamman, MD  predniSONE (DELTASONE) 10 MG tablet Starting with 50 mg and reducing by 10mg  every 3 weeks. 07/19/16  Yes Praveen Mannam, MD  rosuvastatin (CRESTOR) 5 MG tablet Take 5 mg by mouth at bedtime.   Yes Historical Provider, MD  traMADol (ULTRAM) 50 MG tablet Take 1 tablet (50 mg total) by mouth every 6 (six) hours as needed. Patient taking differently: Take 50 mg by mouth every 6 (six) hours as needed (for pain).  07/11/14  Yes Charlesetta Shanks, MD  Wheat Dextrin (BENEFIBER) POWD Take 15 mLs by mouth 2 (two) times daily. Mix 3 teaspoonsful into juice and drink two times a day    Yes Historical Provider, MD  zolpidem (AMBIEN) 10 MG tablet Take 0.5 tablets (5 mg total) by mouth at bedtime as needed. For sleep Patient taking differently: Take 5 mg by mouth at bedtime as needed for sleep.  02/04/12  Yes Midge Minium, MD    Family History Family History  Problem Relation Age of Onset  . Heart disease Mother   . Heart disease Sister   . Diabetes Sister   . Arthritis Father   . Cancer Father   . Crohn's disease Father   . Cancer Paternal Aunt     Social History Social History  Substance Use Topics  . Smoking status: Never Smoker  . Smokeless tobacco: Never Used  . Alcohol use No     Allergies   Demerol; Hydrocodone-acetaminophen; Meperidine hcl; Morphine and related; and Vioxx [rofecoxib]   Review of Systems Review of Systems  Cardiovascular: Positive for  syncope.  Neurological: Positive for light-headedness.  All other systems reviewed and are negative.    Physical Exam Updated Vital Signs BP 104/69   Pulse 74   Temp 98.6 F (37 C) (Rectal)   Resp 26   SpO2 92%   Physical Exam  Constitutional: She is oriented to person, place, and time. She appears well-developed and well-nourished. She appears ill. No distress.  HENT:  Head: Normocephalic.  Nose: Nose normal.  Eyes: Conjunctivae are normal.  Neck: Neck supple. No tracheal deviation present.  Cardiovascular: Normal rate, regular rhythm and normal heart sounds.   Pulmonary/Chest: Effort normal.  Tachypnea noted. No respiratory distress. She has rales (diffuse bilateral).  Abdominal: Soft. She exhibits no distension.  Neurological: She is alert and oriented to person, place, and time.  Skin: Skin is warm and dry.  Psychiatric: She has a normal mood and affect.     ED Treatments / Results  Labs (all labs ordered are listed, but only abnormal results are displayed) Labs Reviewed  CBC WITH DIFFERENTIAL/PLATELET - Abnormal; Notable for the following:       Result Value   WBC 12.0 (*)    Neutro Abs 10.1 (*)    All other components within normal limits  COMPREHENSIVE METABOLIC PANEL - Abnormal; Notable for the following:    Sodium 133 (*)    Chloride 98 (*)    Glucose, Bld 257 (*)    Calcium 8.0 (*)    Total Protein 5.5 (*)    Albumin 2.7 (*)    All other components within normal limits  URINALYSIS, ROUTINE W REFLEX MICROSCOPIC - Abnormal; Notable for the following:    Glucose, UA >=500 (*)    Hgb urine dipstick SMALL (*)    Ketones, ur 5 (*)    Leukocytes, UA SMALL (*)    Bacteria, UA RARE (*)    Squamous Epithelial / LPF 0-5 (*)    All other components within normal limits  BRAIN NATRIURETIC PEPTIDE - Abnormal; Notable for the following:    B Natriuretic Peptide 111.6 (*)    All other components within normal limits  I-STAT ARTERIAL BLOOD GAS, ED - Abnormal;  Notable for the following:    pO2, Arterial 56.0 (*)    All other components within normal limits  CBG MONITORING, ED - Abnormal; Notable for the following:    Glucose-Capillary 247 (*)    All other components within normal limits  INFLUENZA PANEL BY PCR (TYPE A & B)  PROCALCITONIN  POCT CBG (FASTING - GLUCOSE)-MANUAL ENTRY  I-STAT TROPOININ, ED  I-STAT CG4 LACTIC ACID, ED    EKG  EKG Interpretation  Date/Time:  Saturday July 20 2016 18:42:14 EST Ventricular Rate:  73 PR Interval:    QRS Duration: 151 QT Interval:  425 QTC Calculation: 469 R Axis:   60 Text Interpretation:  Sinus rhythm Left bundle branch block Artifact No significant change since last tracing Confirmed by Aryah Doering MD, Manraj Yeo [id number]) on 07/20/2016 6:53:34 PM       Radiology Dg Chest 2 View  Result Date: 07/20/2016 CLINICAL DATA:  Hypoxemia EXAM: CHEST  2 VIEW COMPARISON:  07/18/2016 FINDINGS: Cardiac shadow is stable. Patchy infiltrative changes are again identified throughout both lungs stable from the recent CT examination. No new focal abnormality is seen. No bony abnormality is noted. IMPRESSION: Patchy bilateral infiltrates stable from the recent CT. Electronically Signed   By: Inez Catalina M.D.   On: 07/20/2016 19:52    Procedures Procedures (including critical care time)  Medications Ordered in ED Medications - No data to display   Initial Impression / Assessment and Plan / ED Course  I have reviewed the triage vital signs and the nursing notes.  Pertinent labs & imaging results that were available during my care of the patient were reviewed by me and considered in my medical decision making (see chart for details).     76 y.o. female presents with 2 syncopal episdoes at home. First was witnessed by spouse who noted she became weak and lost consciousness of roughly 10 minutes while awaiting EMS. When EMS sat her up she syncopized  a second time and was hypoxemic in the 70s and 80s on 6L by [place].  Normally on 3L. On arrival here even minimal movement of the Kristina Schroeder produces profound hypoxemia on high flow oxygen. Requiring 50% FiO2 by mask to maintain above 88%. Suspect syncopal episodes are related to frank hypoxemia from underlying lung disease progression. Considered PE but considered less likely given lack of precipitating factor, tachycardia, or evidence of DVT. Hospitalist was consulted for admission and will see the patient in the emergency department.   Final Clinical Impressions(s) / ED Diagnoses   Final diagnoses:  Syncope and collapse  ILD (interstitial lung disease) ([place])  Acute on chronic respiratory failure with hypoxemia [facility])    New Prescriptions New Prescriptions   No medications on file     Leo Grosser, MD 07/21/16 [phone]

## 2016-07-20 NOTE — Consult Note (Signed)
Name: Kristina Schroeder MRN: [id number] DOB: 1941-02-27    ADMISSION DATE:  07/20/2016 CONSULTATION DATE:  07/20/2016  REFERRING MD :  Dr. Eulas Post  CHIEF COMPLAINT:  SOB, ILD  HISTORY OF PRESENT ILLNESS:   76 yo female never smoker with hx fibromyalgia, chronic LBBB, sinus tachycardia regulated with B blocker and diltiazem, DM, GERD.  She was recently diagnosed with an ILD of unknown etiology and placed on 20mg  then increased to 60mg  of prednisone which she has been on for 1 month.  She was seen by her pulmonologist yesterday.  She also has liver lesions concerning for malignancy.   She states her SOB symptoms started in 05/2016 and have been only slightly better on the steroids.  She is on 3L of O2 with rest and exertion.  She reports that her O2 sats drop with exertion into the high 80s but she does not increase her O2.  Today she was walking to the bathroom and felt acutely SOB and diaphoretic.  She denies, chest pain, or vertigo.  She called for her husband and lost consciousness.  Husband denies any seizure like activity, bowel, bladder incontinence and she regained consciousness in seconds.   PAST MEDICAL HISTORY :   has a past medical history of Allergy; Bronchitis; Bundle branch block left; Cancer ([place]); Cardiac arrhythmia due to congenital heart disease; Degenerative joint disease; Dyslipidemia; Fibromyalgia; Gastritis; GERD (gastroesophageal reflux disease); Hyperlipidemia; IBS (irritable bowel syndrome); Left ventricular dysfunction; and Shingles.  has a past surgical history that includes Cardiac catheterization (08/2008); Mastectomy, partial; Tonsillectomy; Ganglion cyst excision; Cataract extraction (4 2013); Breast surgery (12 6 2003); Tonsillectomy (1963); and Video bronchoscopy (Bilateral, 05/31/2016). Prior to Admission medications   Medication Sig Start Date End Date Taking? Authorizing Provider  acetaminophen (TYLENOL) 500 MG tablet Take 500-1,000 mg by mouth every 6 (six) hours  as needed for headache (or pain).   Yes Historical Provider, MD  aspirin EC 81 MG tablet Take 81 mg by mouth every other day.   Yes Historical Provider, MD  baclofen (LIORESAL) 10 MG tablet Take 10 mg by mouth 2 (two) times daily as needed for muscle spasms.  01/05/14  Yes Historical Provider, MD  BIOTIN 5000 PO Take 5,000 mg by mouth daily.    Yes Historical Provider, MD  cetirizine (ZYRTEC) 10 MG tablet Take 10 mg by mouth daily.   Yes Historical Provider, MD  cholecalciferol (VITAMIN D) 1000 UNITS tablet Take 2,000 Units by mouth daily.     Yes Historical Provider, MD  diltiazem (CARDIZEM CD) 180 MG 24 hr capsule Take 2 capsules (360 mg total) by mouth daily. 05/20/16  Yes Peter M Martinique, MD  DULoxetine (CYMBALTA) 60 MG capsule Take 1 capsule (60 mg total) by mouth 2 (two) times daily. 02/04/12  Yes Midge Minium, MD  ezetimibe (ZETIA) 10 MG tablet Take 1 tablet (10 mg total) by mouth daily. Patient taking differently: Take 10 mg by mouth at bedtime.  06/07/16  Yes Peter M Martinique, MD  furosemide (LASIX) 20 MG tablet Take 1 tablet (20 mg total) by mouth daily as needed for fluid or edema. For weight gain of >3Lbs in 1 day. 06/02/16  Yes Lavina Hamman, MD  glimepiride (AMARYL) 2 MG tablet Take 2 mg by mouth 2 (two) times daily. 05/22/16  Yes Historical Provider, MD  guaiFENesin (MUCINEX) 600 MG 12 hr tablet Take 1 tablet (600 mg total) by mouth 2 (two) times daily. 06/02/16  Yes Lavina Hamman, MD  Insulin  Glargine (TOUJEO SOLOSTAR) 300 UNIT/ML SOPN Inject 10 Units into the skin at bedtime.   Yes Historical Provider, MD  nadolol (CORGARD) 20 MG tablet Take 1 tablet (20 mg total) by mouth daily. 12/25/15  Yes Peter M Martinique, MD  pantoprazole (PROTONIX) 40 MG tablet Take 1 tablet (40 mg total) by mouth 2 (two) times daily before a meal. 06/02/16  Yes Lavina Hamman, MD  predniSONE (DELTASONE) 10 MG tablet Starting with 50 mg and reducing by 10mg  every 3 weeks. 07/19/16  Yes Praveen Mannam, MD    rosuvastatin (CRESTOR) 5 MG tablet Take 5 mg by mouth at bedtime.   Yes Historical Provider, MD  traMADol (ULTRAM) 50 MG tablet Take 1 tablet (50 mg total) by mouth every 6 (six) hours as needed. Patient taking differently: Take 50 mg by mouth every 6 (six) hours as needed (for pain).  07/11/14  Yes Charlesetta Shanks, MD  Wheat Dextrin (BENEFIBER) POWD Take 15 mLs by mouth 2 (two) times daily. Mix 3 teaspoonsful into juice and drink two times a day    Yes Historical Provider, MD  zolpidem (AMBIEN) 10 MG tablet Take 0.5 tablets (5 mg total) by mouth at bedtime as needed. For sleep Patient taking differently: Take 5 mg by mouth at bedtime as needed for sleep.  02/04/12  Yes Midge Minium, MD   Allergies  Allergen Reactions  . Demerol Nausea And Vomiting       . Hydrocodone-Acetaminophen Nausea And Vomiting       . Meperidine Hcl Nausea And Vomiting       . Morphine And Related Nausea And Vomiting        . Vioxx [Rofecoxib] Nausea And Vomiting           FAMILY HISTORY:  family history includes Arthritis in her father; Cancer in her father and paternal aunt; Crohn's disease in her father; Diabetes in her sister; Heart disease in her mother and sister. SOCIAL HISTORY:  reports that she has never smoked. She has never used smokeless tobacco. She reports that she does not drink alcohol.  REVIEW OF SYSTEMS:   Constitutional: Negative for fever, chills, weight loss, malaise/fatigue and diaphoresis.  HENT: Negative for hearing loss, ear pain, nosebleeds, congestion, sore throat, neck pain, tinnitus and ear discharge.   Eyes: Negative for blurred vision, double vision, photophobia, pain, discharge and redness.  Respiratory: Negative for cough, hemoptysis, sputum production, shortness of breath, wheezing and stridor.   Cardiovascular: Negative for chest pain, palpitations, orthopnea, claudication, leg swelling and PND.  Gastrointestinal: Negative for heartburn, nausea, vomiting, abdominal pain,  diarrhea, constipation, blood in stool and melena.  Genitourinary: Negative for dysuria, urgency, frequency, hematuria and flank pain.  Musculoskeletal: Negative for myalgias, back pain, joint pain and falls.  Skin: Negative for itching and rash.  Neurological: Negative for dizziness, tingling, tremors, sensory change, speech change, focal weakness, seizures, loss of consciousness, weakness and headaches.  Endo/Heme/Allergies: Negative for environmental allergies and polydipsia. Does not bruise/bleed easily.  SUBJECTIVE:   VITAL SIGNS: Temp:  [98.6 F (37 C)] 98.6 F (37 C) (02/10 1904) Pulse Rate:  [74-80] 75 (02/10 2130) Resp:  [18-27] 22 (02/10 2130) BP: (101-117)/(59-80) 116/64 (02/10 2130) SpO2:  [86 %-93 %] 90 % (02/10 2130) FiO2 (%):  [50 %] 50 % (02/10 1855)  PHYSICAL EXAMINATION: General:  NAD, well nourished Neuro:  CN II-XII intact HEENT:  NCAT, MM dry Cardiovascular:  RRR, s1/s2 no m/r/g.  Trace to 1+ pitting edema b/l LE Lungs:  Diffuse, bilateral fine rales noted, no w/r Abdomen:  Soft, non-tender, normal bowel sounds Musculoskeletal:  Normal bulk and tone, joints normal Skin:  No rashes   Recent Labs Lab 07/20/16 1856  NA 133*  K 3.5  CL 98*  CO2 26  BUN 8  CREATININE 0.64  GLUCOSE 257*    Recent Labs Lab 07/20/16 1856  HGB 14.0  HCT 42.2  WBC 12.0*  PLT 196   Dg Chest 2 View  Result Date: 07/20/2016 CLINICAL DATA:  Hypoxemia EXAM: CHEST  2 VIEW COMPARISON:  07/18/2016 FINDINGS: Cardiac shadow is stable. Patchy infiltrative changes are again identified throughout both lungs stable from the recent CT examination. No new focal abnormality is seen. No bony abnormality is noted. IMPRESSION: Patchy bilateral infiltrates stable from the recent CT. Electronically Signed   By: Inez Catalina M.D.   On: 07/20/2016 19:52    ASSESSMENT / PLAN: 76 yo female with ILD of unclear etiology, I would agree with Dr. Richarda Osmond assessment on yesterday's note broad  differential diagnosis but paucity of steroid responsiveness speaks strongly against organizing pneumonia and/or eosinophilic PNA.  I would also add the the peri-bronchovascular distribution and lymphocytic predominance on the BAL CBC/Diff, adds subacute vs chronic hypersensitivity pneumonitis and sarcoidosis to the differential diagnosis, myositis.  Microbiology has all been negative.   - check RVP - Check Myositis panel including PL-7, PL 12 and anti-MDA4 antibodies. - Decrease prednisone to 50mg  PO Qday -  Diuresis with lasix 20mg  IV x1 goal net negative 500-1L - Admit to hospitalist stepdown - continue venturi mask for now, can upgrade to optiflo if needed  Pulmonary will continue to follow.  Total time in pulmonary consultation: 30 min   Meribeth Mattes, DO., MS [facility]      Pulmonary and [place] Pager: [phone]  07/20/2016, 10:04 PM

## 2016-07-20 NOTE — H&P (Signed)
History and Physical    Kristina Schroeder [id number] DOB: May 01, 1941 DOA: 07/20/2016  PCP: Cari Caraway, MD  Pulmonary: Mannam  Patient coming from: Home  Chief Complaint: Syncope x 2  HPI: Kristina Schroeder is a 76 y.o. woman with a history of interstitial lung disease/pulmonary fibrosis causing chronic hypoxic respiratory failure (she is on 3L [place] at baseline), breast cancer, chronic LBBB, DM, GERD, dyslipidemia, and fibromyalgia who was in her baseline state of health until this evening.  She got up to go to the restroom around 5:15pm.  She started feeling light-headed and called out to her husband.  He got to her in time to break her fall as she was having a syncopal episode in the doorway to the bathroom.  Her husband was able to get her to the ground, and he reports that she was unconscious for 10 minutes.  He immediately called 911.  The patient was awake prior to their arrival.  No apparent post-ictal state.  No convulsion.  No bowel or bladder incontinence.  When EMS personnel attempted to have her sit up, she had another syncopal episode, but she was only unconscious for 1-2 minutes the second time.  EMS documented orthostatic vital signs.  She received 1L of NS en route to the ED.  She was also put on a NRB for decreased O2 saturations.    She denies chest pain.  She has had chronic SOB since her diagnosis of ILD in December.  No fevers.  She has had a cough, intermittently productive of yellow sputum.    Of note, she saw Dr. Vaughan Browner in clinic yesterday and had a high resolution CT.  She is supposed to be starting a prednisone taper (decreasing by 10mg  every 3 days).  ED Course: Per the ED attending, she has been weaned to a 50% venti mask, but this is still an increased O2 requirement to maintain O2 sats over 90%.  WBC count 12.  Sodium 133, chloride 98.  Lactic acid level 1.6.  EKG unchanged.  Chest xray shows bilateral patchy infiltrates that are thought to be consistent with changes on CT.   ABG showed pH 7.395/pCO2 42/pO2 56.  BNP 111.  Flu screen negative.  U/A shows small leukocytes, 6-30 WBC, rare bacteria.  Hospitalist asked to admit.  Pulmonary consult requested promptly, by me, due to her underlying lung disease and significant increase in O2 requirement.    Review of Systems: As per HPI otherwise 10 point review of systems negative.    Past Medical History:  Diagnosis Date  . Allergy   . Bronchitis    acute  . Bundle branch block left   . [facility])    Breast  . 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 Degenerative joint disease   . Dyslipidemia   . Fibromyalgia   . Gastritis   . GERD (gastroesophageal reflux disease)   . Hyperlipidemia   . IBS (irritable bowel syndrome)   . Left ventricular dysfunction   . Shingles     Past Surgical History:  Procedure Laterality Date  . BREAST SURGERY  12 6 2003  . CARDIAC CATHETERIZATION  08/2008  . CATARACT EXTRACTION  4 2013  . GANGLION CYST EXCISION    . MASTECTOMY, PARTIAL     Status post left   . TONSILLECTOMY    . TONSILLECTOMY  1963  . VIDEO BRONCHOSCOPY Bilateral 05/31/2016   Procedure: VIDEO BRONCHOSCOPY WITH FLUORO;  Surgeon: Marshell Garfinkel, MD;  Location: [facility]  ENDOSCOPY;  Service: Cardiopulmonary;  Laterality: Bilateral;     reports that she has never smoked. She has never used smokeless tobacco. She reports that she does not drink alcohol. Her drug history is not on file.  Allergies  Allergen Reactions  . Demerol Nausea And Vomiting       . Hydrocodone-Acetaminophen Nausea And Vomiting       . Meperidine Hcl Nausea And Vomiting       . Morphine And Related Nausea And Vomiting        . Vioxx [Rofecoxib] Nausea And Vomiting           Family History  Problem Relation Age of Onset  . Heart disease Mother   . Heart disease Sister   . Diabetes Sister   . Arthritis Father   . Cancer Father   . Crohn's disease Father   . Cancer Paternal Aunt      Prior to Admission  medications   Medication Sig Start Date End Date Taking? Authorizing Provider  acetaminophen (TYLENOL) 500 MG tablet Take 500-1,000 mg by mouth every 6 (six) hours as needed for headache (or pain).   Yes Historical Provider, MD  aspirin EC 81 MG tablet Take 81 mg by mouth every other day.   Yes Historical Provider, MD  baclofen (LIORESAL) 10 MG tablet Take 10 mg by mouth 2 (two) times daily as needed for muscle spasms.  01/05/14  Yes Historical Provider, MD  BIOTIN 5000 PO Take 5,000 mg by mouth daily.    Yes Historical Provider, MD  cetirizine (ZYRTEC) 10 MG tablet Take 10 mg by mouth daily.   Yes Historical Provider, MD  cholecalciferol (VITAMIN D) 1000 UNITS tablet Take 2,000 Units by mouth daily.     Yes Historical Provider, MD  diltiazem (CARDIZEM CD) 180 MG 24 hr capsule Take 2 capsules (360 mg total) by mouth daily. 05/20/16  Yes Peter M Martinique, MD  DULoxetine (CYMBALTA) 60 MG capsule Take 1 capsule (60 mg total) by mouth 2 (two) times daily. 02/04/12  Yes Midge Minium, MD  ezetimibe (ZETIA) 10 MG tablet Take 1 tablet (10 mg total) by mouth daily. Patient taking differently: Take 10 mg by mouth at bedtime.  06/07/16  Yes Peter M Martinique, MD  furosemide (LASIX) 20 MG tablet Take 1 tablet (20 mg total) by mouth daily as needed for fluid or edema. For weight gain of >3Lbs in 1 day. 06/02/16  Yes Lavina Hamman, MD  glimepiride (AMARYL) 2 MG tablet Take 2 mg by mouth 2 (two) times daily. 05/22/16  Yes Historical Provider, MD  guaiFENesin (MUCINEX) 600 MG 12 hr tablet Take 1 tablet (600 mg total) by mouth 2 (two) times daily. 06/02/16  Yes Lavina Hamman, MD  Insulin Glargine (TOUJEO SOLOSTAR) 300 UNIT/ML SOPN Inject 10 Units into the skin at bedtime.   Yes Historical Provider, MD  nadolol (CORGARD) 20 MG tablet Take 1 tablet (20 mg total) by mouth daily. 12/25/15  Yes Peter M Martinique, MD  pantoprazole (PROTONIX) 40 MG tablet Take 1 tablet (40 mg total) by mouth 2 (two) times daily before a meal.  06/02/16  Yes Lavina Hamman, MD  predniSONE (DELTASONE) 10 MG tablet Starting with 50 mg and reducing by 10mg  every 3 weeks. 07/19/16  Yes Praveen Mannam, MD  rosuvastatin (CRESTOR) 5 MG tablet Take 5 mg by mouth at bedtime.   Yes Historical Provider, MD  traMADol (ULTRAM) 50 MG tablet Take 1 tablet (50 mg total) by  mouth every 6 (six) hours as needed. Patient taking differently: Take 50 mg by mouth every 6 (six) hours as needed (for pain).  07/11/14  Yes Charlesetta Shanks, MD  Wheat Dextrin (BENEFIBER) POWD Take 15 mLs by mouth 2 (two) times daily. Mix 3 teaspoonsful into juice and drink two times a day    Yes Historical Provider, MD  zolpidem (AMBIEN) 10 MG tablet Take 0.5 tablets (5 mg total) by mouth at bedtime as needed. For sleep Patient taking differently: Take 5 mg by mouth at bedtime as needed for sleep.  02/04/12  Yes Midge Minium, MD    Physical Exam: Vitals:   07/20/16 2030 07/20/16 2100 07/20/16 2115 07/20/16 2130  BP: 107/64 112/59 117/65 116/64  Pulse: 78 74 76 75  Resp: 22 24 26 22   Temp:      TempSrc:      SpO2: 93% 92% 92% 90%      Constitutional: NAD, calm, comfortable Vitals:   07/20/16 2030 07/20/16 2100 07/20/16 2115 07/20/16 2130  BP: 107/64 112/59 117/65 116/64  Pulse: 78 74 76 75  Resp: 22 24 26 22   Temp:      TempSrc:      SpO2: 93% 92% 92% 90%   Eyes: PERRL, lids and conjunctivae normal ENMT: Deferred; I did not remove the Venti Mask. Neck: normal appearance, supple, no masses Respiratory: Bilateral crackles.  No wheezing.  Normal respiratory effort. No accessory muscle use.  Cardiovascular: Normal rate, regular rhythm, no murmurs / rubs / gallops. No extremity edema. 2+ pedal pulses. No carotid bruits.  GI: abdomen is soft and compressible.  No distention.  No tenderness.  No masses palpated.  Bowel sounds are present. Musculoskeletal:  No joint deformity in upper and lower extremities. Good ROM, no contractures. Normal muscle tone.  Skin: no rashes,  warm and dry Neurologic: CN 2-12 grossly intact. Sensation intact, Strength symmetric bilaterally, 5/5. Psychiatric: Normal judgment and insight. Alert and oriented x 3. Normal mood.     Labs on Admission: I have personally reviewed following labs and imaging studies  CBC:  Recent Labs Lab 07/20/16 1856  WBC 12.0*  NEUTROABS 10.1*  HGB 14.0  HCT 42.2  MCV 85.4  PLT 123456   Basic Metabolic Panel:  Recent Labs Lab 07/20/16 1856  NA 133*  K 3.5  CL 98*  CO2 26  GLUCOSE 257*  BUN 8  CREATININE 0.64  CALCIUM 8.0*   GFR: Estimated Creatinine Clearance: 55.8 mL/min (by C-G formula based on SCr of 0.64 mg/dL). Liver Function Tests:  Recent Labs Lab 07/20/16 1856  AST 23  ALT 23  ALKPHOS 82  BILITOT 0.8  PROT 5.5*  ALBUMIN 2.7*   CBG:  Recent Labs Lab 07/20/16 1955  GLUCAP 247*   Urine analysis:    Component Value Date/Time   COLORURINE YELLOW 07/20/2016 1903   APPEARANCEUR CLEAR 07/20/2016 1903   LABSPEC 1.006 07/20/2016 1903   PHURINE 7.0 07/20/2016 1903   GLUCOSEU >=500 (A) 07/20/2016 1903   HGBUR SMALL (A) 07/20/2016 1903   BILIRUBINUR NEGATIVE 07/20/2016 1903   KETONESUR 5 (A) 07/20/2016 1903   PROTEINUR NEGATIVE 07/20/2016 1903   NITRITE NEGATIVE 07/20/2016 1903   LEUKOCYTESUR SMALL (A) 07/20/2016 1903   Sepsis Labs:  Lactic acid level 1.6  Radiological Exams on Admission: Dg Chest 2 View  Result Date: 07/20/2016 CLINICAL DATA:  Hypoxemia EXAM: CHEST  2 VIEW COMPARISON:  07/18/2016 FINDINGS: Cardiac shadow is stable. Patchy infiltrative changes are again identified throughout both  lungs stable from the recent CT examination. No new focal abnormality is seen. No bony abnormality is noted. IMPRESSION: Patchy bilateral infiltrates stable from the recent CT. Electronically Signed   By: Inez Catalina M.D.   On: 07/20/2016 19:52    EKG: Independently reviewed. Unchanged compared to prior.  Assessment/Plan Active Problems:   Left bundle branch  hemiblock   GERD (gastroesophageal reflux disease)   Type 2 diabetes mellitus without complication, without long-term current use of insulin (HCC)   Acute hypoxemic respiratory failure (HCC)   ILD (interstitial lung disease) (HCC)   Syncope and collapse   Orthostatic hypotension      Syncope and collapse with orthostasis and acute on chronic hypoxic respiratory failure. --Place in stepdown unit due to oxygen requirement --repeat orthostatics on the floor --Hydrate with NS --Pulmonary consult pending --Procalcitonin ordered per pulmonary recommendation.  Additional lung imaging deferred until patient seen by pulmonary since she just had a high resolution CT.  ILD, acute on chronic hypoxic respiratory failure --Continue prednisone 50mg  daily for now.  Pulmonary consult pending.  They should advise on taper. --Currently on 50% venti mask, wean, if tolerated, to 3L [place] which was baseline --Clear liquids only for now --She remains a full code (would want intubation if needed) at this time  Abnormal U/A but patient does not have LUTS --Will check urine cultures --HOLD on antibiotics for now  DM --HOLD oral agents, continue lantus dose.  SSI AC/HS, aggressive scale, since she is on steroids.   DVT prophylaxis: SubQ Lovenox Code Status: FULL Family Communication: Husband present at bedside in the ED at time of admission. Disposition Plan: To be determined. Consults called: Pulmonary Admission status: Place in observation, stepdown unit due to oxygen requirement.  High risk for decompensation with underlying pulmonary fibrosis.   TIME SPENT: 70 minutes   Eber Jones MD [facility] Pager [phone]  If 7PM-7AM, please contact night-coverage [email or web address] Password [facility]  07/20/2016, 10:33 PM

## 2016-07-20 NOTE — ED Notes (Signed)
[phone] Richard - husband would like to be notified when pt gets a room.

## 2016-07-20 NOTE — ED Notes (Signed)
Patient transported to X-ray 

## 2016-07-20 NOTE — ED Triage Notes (Signed)
Received pt from home with c/o weakness and dizziness ongoing for some time that increased today leading to syncopal episode with husband. Husband was able to catch pt preventing her from falling. Upon arrival of EMS pt initial BP was 110 SBP. Sitting pt was 90 SBP, standing SBP 60 and pt had syncopal episode with EMS. Pt given 1 Liter bolus of NS.

## 2016-07-21 ENCOUNTER — Encounter (HOSPITAL_COMMUNITY): Payer: Self-pay | Admitting: Internal Medicine

## 2016-07-21 DIAGNOSIS — R55 Syncope and collapse: Secondary | ICD-10-CM | POA: Diagnosis not present

## 2016-07-21 DIAGNOSIS — I951 Orthostatic hypotension: Secondary | ICD-10-CM | POA: Diagnosis present

## 2016-07-21 DIAGNOSIS — Z853 Personal history of malignant neoplasm of breast: Secondary | ICD-10-CM | POA: Diagnosis not present

## 2016-07-21 DIAGNOSIS — N183 Chronic kidney disease, stage 3 (moderate): Secondary | ICD-10-CM | POA: Diagnosis present

## 2016-07-21 DIAGNOSIS — J8410 Pulmonary fibrosis, unspecified: Secondary | ICD-10-CM | POA: Diagnosis present

## 2016-07-21 DIAGNOSIS — J849 Interstitial pulmonary disease, unspecified: Secondary | ICD-10-CM | POA: Diagnosis not present

## 2016-07-21 DIAGNOSIS — R0602 Shortness of breath: Secondary | ICD-10-CM | POA: Diagnosis not present

## 2016-07-21 DIAGNOSIS — E876 Hypokalemia: Secondary | ICD-10-CM | POA: Diagnosis not present

## 2016-07-21 DIAGNOSIS — J8489 Other specified interstitial pulmonary diseases: Secondary | ICD-10-CM | POA: Diagnosis present

## 2016-07-21 DIAGNOSIS — K7689 Other specified diseases of liver: Secondary | ICD-10-CM | POA: Diagnosis present

## 2016-07-21 DIAGNOSIS — M797 Fibromyalgia: Secondary | ICD-10-CM | POA: Diagnosis present

## 2016-07-21 DIAGNOSIS — J9621 Acute and chronic respiratory failure with hypoxia: Secondary | ICD-10-CM | POA: Diagnosis not present

## 2016-07-21 DIAGNOSIS — E785 Hyperlipidemia, unspecified: Secondary | ICD-10-CM | POA: Diagnosis present

## 2016-07-21 DIAGNOSIS — R262 Difficulty in walking, not elsewhere classified: Secondary | ICD-10-CM | POA: Diagnosis not present

## 2016-07-21 DIAGNOSIS — Z7982 Long term (current) use of aspirin: Secondary | ICD-10-CM | POA: Diagnosis not present

## 2016-07-21 DIAGNOSIS — K219 Gastro-esophageal reflux disease without esophagitis: Secondary | ICD-10-CM | POA: Diagnosis not present

## 2016-07-21 DIAGNOSIS — E1165 Type 2 diabetes mellitus with hyperglycemia: Secondary | ICD-10-CM | POA: Diagnosis present

## 2016-07-21 DIAGNOSIS — Z7189 Other specified counseling: Secondary | ICD-10-CM | POA: Diagnosis not present

## 2016-07-21 DIAGNOSIS — N390 Urinary tract infection, site not specified: Secondary | ICD-10-CM | POA: Diagnosis present

## 2016-07-21 DIAGNOSIS — J9620 Acute and chronic respiratory failure, unspecified whether with hypoxia or hypercapnia: Secondary | ICD-10-CM | POA: Diagnosis not present

## 2016-07-21 DIAGNOSIS — I2789 Other specified pulmonary heart diseases: Secondary | ICD-10-CM | POA: Diagnosis not present

## 2016-07-21 DIAGNOSIS — Z9981 Dependence on supplemental oxygen: Secondary | ICD-10-CM | POA: Diagnosis not present

## 2016-07-21 DIAGNOSIS — I272 Pulmonary hypertension, unspecified: Secondary | ICD-10-CM | POA: Diagnosis present

## 2016-07-21 DIAGNOSIS — I4460 Unspecified fascicular block: Secondary | ICD-10-CM | POA: Diagnosis present

## 2016-07-21 DIAGNOSIS — K210 Gastro-esophageal reflux disease with esophagitis: Secondary | ICD-10-CM | POA: Diagnosis not present

## 2016-07-21 DIAGNOSIS — E119 Type 2 diabetes mellitus without complications: Secondary | ICD-10-CM

## 2016-07-21 DIAGNOSIS — J9601 Acute respiratory failure with hypoxia: Secondary | ICD-10-CM | POA: Diagnosis not present

## 2016-07-21 DIAGNOSIS — R0600 Dyspnea, unspecified: Secondary | ICD-10-CM | POA: Diagnosis not present

## 2016-07-21 DIAGNOSIS — Z794 Long term (current) use of insulin: Secondary | ICD-10-CM | POA: Diagnosis not present

## 2016-07-21 DIAGNOSIS — N3000 Acute cystitis without hematuria: Secondary | ICD-10-CM | POA: Diagnosis not present

## 2016-07-21 DIAGNOSIS — E1122 Type 2 diabetes mellitus with diabetic chronic kidney disease: Secondary | ICD-10-CM | POA: Diagnosis present

## 2016-07-21 DIAGNOSIS — T502X5A Adverse effect of carbonic-anhydrase inhibitors, benzothiadiazides and other diuretics, initial encounter: Secondary | ICD-10-CM | POA: Diagnosis not present

## 2016-07-21 DIAGNOSIS — Z515 Encounter for palliative care: Secondary | ICD-10-CM | POA: Diagnosis not present

## 2016-07-21 DIAGNOSIS — Z79899 Other long term (current) drug therapy: Secondary | ICD-10-CM | POA: Diagnosis not present

## 2016-07-21 LAB — CBG MONITORING, ED: GLUCOSE-CAPILLARY: 169 mg/dL — AB (ref 65–99)

## 2016-07-21 LAB — RESPIRATORY PANEL BY PCR
ADENOVIRUS-RVPPCR: NOT DETECTED
Bordetella pertussis: NOT DETECTED
CHLAMYDOPHILA PNEUMONIAE-RVPPCR: NOT DETECTED
CORONAVIRUS HKU1-RVPPCR: NOT DETECTED
CORONAVIRUS NL63-RVPPCR: NOT DETECTED
Coronavirus 229E: NOT DETECTED
Coronavirus OC43: NOT DETECTED
INFLUENZA A-RVPPCR: NOT DETECTED
Influenza B: NOT DETECTED
MYCOPLASMA PNEUMONIAE-RVPPCR: NOT DETECTED
Metapneumovirus: NOT DETECTED
Parainfluenza Virus 1: NOT DETECTED
Parainfluenza Virus 2: NOT DETECTED
Parainfluenza Virus 3: NOT DETECTED
Parainfluenza Virus 4: NOT DETECTED
RHINOVIRUS / ENTEROVIRUS - RVPPCR: NOT DETECTED
Respiratory Syncytial Virus: NOT DETECTED

## 2016-07-21 LAB — CBC
HCT: 43.9 % (ref 36.0–46.0)
Hemoglobin: 14.5 g/dL (ref 12.0–15.0)
MCH: 28.3 pg (ref 26.0–34.0)
MCHC: 33 g/dL (ref 30.0–36.0)
MCV: 85.6 fL (ref 78.0–100.0)
PLATELETS: 195 10*3/uL (ref 150–400)
RBC: 5.13 MIL/uL — ABNORMAL HIGH (ref 3.87–5.11)
RDW: 15.2 % (ref 11.5–15.5)
WBC: 12.1 10*3/uL — ABNORMAL HIGH (ref 4.0–10.5)

## 2016-07-21 LAB — GLUCOSE, CAPILLARY
GLUCOSE-CAPILLARY: 132 mg/dL — AB (ref 65–99)
Glucose-Capillary: 261 mg/dL — ABNORMAL HIGH (ref 65–99)
Glucose-Capillary: 166 mg/dL — ABNORMAL HIGH (ref 65–99)

## 2016-07-21 LAB — BASIC METABOLIC PANEL
Anion gap: 10 (ref 5–15)
BUN: 5 mg/dL — AB (ref 6–20)
CO2: 24 mmol/L (ref 22–32)
Calcium: 8.1 mg/dL — ABNORMAL LOW (ref 8.9–10.3)
Chloride: 99 mmol/L — ABNORMAL LOW (ref 101–111)
Creatinine, Ser: 0.55 mg/dL (ref 0.44–1.00)
GFR calc Af Amer: >60 mL/min (ref >60)
GLUCOSE: 177 mg/dL — AB (ref 65–99)
Potassium: 3.6 mmol/L (ref 3.5–5.1)
SODIUM: 133 mmol/L — AB (ref 135–145)

## 2016-07-21 LAB — MRSA PCR SCREENING: MRSA BY PCR: NEGATIVE

## 2016-07-21 MED ORDER — PANTOPRAZOLE SODIUM 40 MG PO TBEC
40.0000 mg | DELAYED_RELEASE_TABLET | Freq: Two times a day (BID) | ORAL | Status: DC
  Administered 2016-07-21 – 2016-07-30 (×18): 40 mg via ORAL
  Filled 2016-07-21 (×19): qty 1

## 2016-07-21 MED ORDER — DULOXETINE HCL 60 MG PO CPEP
60.0000 mg | ORAL_CAPSULE | Freq: Two times a day (BID) | ORAL | Status: DC
  Administered 2016-07-21 – 2016-07-30 (×19): 60 mg via ORAL
  Filled 2016-07-21 (×21): qty 1

## 2016-07-21 MED ORDER — PREDNISONE 10 MG PO TABS
50.0000 mg | ORAL_TABLET | Freq: Every day | ORAL | Status: DC
  Administered 2016-07-21: 50 mg via ORAL
  Filled 2016-07-21 (×2): qty 5

## 2016-07-21 MED ORDER — NADOLOL 20 MG PO TABS
20.0000 mg | ORAL_TABLET | Freq: Every day | ORAL | Status: DC
  Administered 2016-07-21 – 2016-07-30 (×10): 20 mg via ORAL
  Filled 2016-07-21 (×10): qty 1

## 2016-07-21 MED ORDER — ROSUVASTATIN CALCIUM 10 MG PO TABS
5.0000 mg | ORAL_TABLET | Freq: Every day | ORAL | Status: DC
  Administered 2016-07-21 – 2016-07-29 (×10): 5 mg via ORAL
  Filled 2016-07-21 (×10): qty 1

## 2016-07-21 MED ORDER — ENOXAPARIN SODIUM 40 MG/0.4ML ~~LOC~~ SOLN
40.0000 mg | SUBCUTANEOUS | Status: DC
  Administered 2016-07-21 – 2016-07-30 (×10): 40 mg via SUBCUTANEOUS
  Filled 2016-07-21 (×11): qty 0.4

## 2016-07-21 MED ORDER — ZOLPIDEM TARTRATE 5 MG PO TABS
5.0000 mg | ORAL_TABLET | Freq: Every evening | ORAL | Status: DC | PRN
  Administered 2016-07-22 – 2016-07-29 (×7): 5 mg via ORAL
  Filled 2016-07-21 (×8): qty 1

## 2016-07-21 MED ORDER — FUROSEMIDE 10 MG/ML IJ SOLN
20.0000 mg | Freq: Once | INTRAMUSCULAR | Status: AC
  Administered 2016-07-21: 20 mg via INTRAVENOUS
  Filled 2016-07-21: qty 2

## 2016-07-21 MED ORDER — ONDANSETRON HCL 4 MG/2ML IJ SOLN: 4.0000 mg | Freq: Four times a day (QID) | INTRAMUSCULAR | Status: DC | PRN

## 2016-07-21 MED ORDER — BENEFIBER PO POWD: 15.0000 mL | Freq: Two times a day (BID) | ORAL | Status: DC

## 2016-07-21 MED ORDER — INSULIN GLARGINE 100 UNIT/ML ~~LOC~~ SOLN
10.0000 [IU] | Freq: Every day | SUBCUTANEOUS | Status: DC
  Administered 2016-07-21 – 2016-07-22 (×3): 10 [IU] via SUBCUTANEOUS
  Filled 2016-07-21 (×3): qty 0.1

## 2016-07-21 MED ORDER — EZETIMIBE 10 MG PO TABS
10.0000 mg | ORAL_TABLET | Freq: Every day | ORAL | Status: DC
  Administered 2016-07-21 – 2016-07-29 (×10): 10 mg via ORAL
  Filled 2016-07-21 (×10): qty 1

## 2016-07-21 MED ORDER — DILTIAZEM HCL ER COATED BEADS 180 MG PO CP24
360.0000 mg | ORAL_CAPSULE | Freq: Every day | ORAL | Status: DC
  Administered 2016-07-21 – 2016-07-30 (×10): 360 mg via ORAL
  Filled 2016-07-21 (×3): qty 2
  Filled 2016-07-21 (×2): qty 1
  Filled 2016-07-21: qty 2
  Filled 2016-07-21: qty 1
  Filled 2016-07-21 (×2): qty 2
  Filled 2016-07-21: qty 1
  Filled 2016-07-21: qty 2

## 2016-07-21 MED ORDER — ACETAMINOPHEN 650 MG RE SUPP: 650.0000 mg | Freq: Four times a day (QID) | RECTAL | Status: DC | PRN

## 2016-07-21 MED ORDER — ACETAMINOPHEN 325 MG PO TABS
650.0000 mg | ORAL_TABLET | Freq: Four times a day (QID) | ORAL | Status: DC | PRN
  Administered 2016-07-26: 650 mg via ORAL
  Filled 2016-07-21 (×2): qty 2

## 2016-07-21 MED ORDER — BLISTEX MEDICATED EX OINT
TOPICAL_OINTMENT | CUTANEOUS | Status: DC | PRN
  Administered 2016-07-21: 1 via TOPICAL
  Filled 2016-07-21: qty 6.3

## 2016-07-21 MED ORDER — BACLOFEN 10 MG PO TABS: 10.0000 mg | ORAL_TABLET | Freq: Two times a day (BID) | ORAL | Status: DC | PRN

## 2016-07-21 MED ORDER — INSULIN ASPART 100 UNIT/ML ~~LOC~~ SOLN
0.0000 [IU] | Freq: Three times a day (TID) | SUBCUTANEOUS | Status: DC
  Administered 2016-07-21: 11 [IU] via SUBCUTANEOUS
  Administered 2016-07-21: 2 [IU] via SUBCUTANEOUS
  Administered 2016-07-21 – 2016-07-22 (×3): 4 [IU] via SUBCUTANEOUS
  Administered 2016-07-23: 11 [IU] via SUBCUTANEOUS
  Administered 2016-07-23: 4 [IU] via SUBCUTANEOUS
  Administered 2016-07-23 – 2016-07-24 (×2): 11 [IU] via SUBCUTANEOUS
  Administered 2016-07-24 – 2016-07-25 (×3): 7 [IU] via SUBCUTANEOUS
  Administered 2016-07-25: 4 [IU] via SUBCUTANEOUS
  Administered 2016-07-25 – 2016-07-26 (×3): 7 [IU] via SUBCUTANEOUS
  Administered 2016-07-26: 4 [IU] via SUBCUTANEOUS
  Administered 2016-07-27 (×2): 7 [IU] via SUBCUTANEOUS
  Administered 2016-07-27: 4 [IU] via SUBCUTANEOUS
  Administered 2016-07-28: 11 [IU] via SUBCUTANEOUS
  Administered 2016-07-28: 4 [IU] via SUBCUTANEOUS
  Administered 2016-07-28: 11 [IU] via SUBCUTANEOUS
  Administered 2016-07-29 (×2): 7 [IU] via SUBCUTANEOUS
  Administered 2016-07-30: 8 [IU] via SUBCUTANEOUS
  Administered 2016-07-30: 4 [IU] via SUBCUTANEOUS

## 2016-07-21 MED ORDER — PSYLLIUM 95 % PO PACK
1.0000 | PACK | Freq: Two times a day (BID) | ORAL | Status: DC
  Administered 2016-07-22 – 2016-07-30 (×16): 1 via ORAL
  Filled 2016-07-21 (×21): qty 1

## 2016-07-21 MED ORDER — ONDANSETRON HCL 4 MG PO TABS: 4.0000 mg | ORAL_TABLET | Freq: Four times a day (QID) | ORAL | Status: DC | PRN

## 2016-07-21 MED ORDER — TRAMADOL HCL 50 MG PO TABS: 50.0000 mg | ORAL_TABLET | Freq: Four times a day (QID) | ORAL | Status: DC | PRN

## 2016-07-21 MED ORDER — ASPIRIN EC 81 MG PO TBEC
81.0000 mg | DELAYED_RELEASE_TABLET | ORAL | Status: DC
  Administered 2016-07-21 – 2016-07-29 (×5): 81 mg via ORAL
  Filled 2016-07-21 (×8): qty 1

## 2016-07-21 MED ORDER — LORATADINE 10 MG PO TABS
10.0000 mg | ORAL_TABLET | Freq: Every day | ORAL | Status: DC
  Administered 2016-07-21 – 2016-07-30 (×10): 10 mg via ORAL
  Filled 2016-07-21 (×10): qty 1

## 2016-07-21 MED ORDER — GUAIFENESIN ER 600 MG PO TB12
600.0000 mg | ORAL_TABLET | Freq: Two times a day (BID) | ORAL | Status: DC
  Administered 2016-07-21 – 2016-07-30 (×20): 600 mg via ORAL
  Filled 2016-07-21 (×20): qty 1

## 2016-07-21 MED ORDER — INSULIN ASPART 100 UNIT/ML ~~LOC~~ SOLN
0.0000 [IU] | Freq: Every day | SUBCUTANEOUS | Status: DC
  Administered 2016-07-22 – 2016-07-23 (×2): 4 [IU] via SUBCUTANEOUS
  Administered 2016-07-24: 2 [IU] via SUBCUTANEOUS
  Administered 2016-07-25: 3 [IU] via SUBCUTANEOUS

## 2016-07-21 MED ORDER — SODIUM CHLORIDE 0.9% FLUSH
3.0000 mL | Freq: Two times a day (BID) | INTRAVENOUS | Status: DC
  Administered 2016-07-21 – 2016-07-29 (×18): 3 mL via INTRAVENOUS

## 2016-07-21 MED ORDER — ORAL CARE MOUTH RINSE
15.0000 mL | Freq: Two times a day (BID) | OROMUCOSAL | Status: DC
  Administered 2016-07-21 – 2016-07-29 (×17): 15 mL via OROMUCOSAL

## 2016-07-21 NOTE — ED Notes (Signed)
RN at bedside assisting with use of bedpan. Pt noted to have SPO2 decrease into the 70's to low 80's with minimal exertion. pts veni mask increased to 55% per RT. Continuing to monitor and pt repositioned to upright. SPO2 improving.  Will notify MD

## 2016-07-21 NOTE — Progress Notes (Addendum)
Progress Note    Kristina Schroeder  [id number] DOB: [birth year]  DOA: 07/20/2016 PCP: Cari Caraway, MD    Brief Narrative:   Chief complaint: Follow-up syncope  Kristina Schroeder is an 76 y.o. female with a PMH of interstitial lung disease, pulmonary fibrosis, chronic hypoxic respiratory failure on 3 L of home oxygen, diabetes, breast cancer, chronic left bundle branch block, dyslipidemia and fibromyalgia who was admitted 07/20/16 for evaluation of 2 syncopal events. She was noted to be orthostatic upon evaluation by EMS and received 1 L of normal saline. She also had an increased oxygen requirement requiring a nonrebreather mask to maintain her oxygen saturations. She saw her pulmonologist 07/19/16 and had a high resolution CT scan to 8/18 which showed patchy irregular areas of consolidation and groundglass opacities throughout the lungs, some of which are improved and some worsened compared to previous CT scan. Underlying pulmonary fibrosis abd multiple attenuation lesions in the liver again identified. Recommendations were to start a slow prednisone taper.  Assessment/Plan:   Principal Problems:   Syncope and collapse/Orthostatic hypotension Noted to be orthostatic on presentation. Placed on IV hydration. Worsening hypoxia may be contributory.    Acute on chronic respiratory failure in the setting of interstitial lung disease and pulmonary fibrosis Chest x-ray personally reviewed. Patchy bilateral infiltrates apparent. Continue prednisone 50 mg, per pulmonology recommendations. Continue oxygen to maintain oxygen saturations, currently on a nonrebreather mask. Follow-up myositis panel. Given 20 mg of IV Lasix yesterday with I/O- 650 mL. Respiratory virus panel negative.   Active Problems:   Left bundle branch hemiblock Monitoring on telemetry.    GERD (gastroesophageal reflux disease) Continue Protonix.    Type 2 diabetes mellitus with stage III CKD and  long-term current use of insulin  (HCC) Currently being managed with Lantus 10 units daily, and insulin resistant sliding scale. CBGs 247, 169.    Hyperlipidemia Continue Crestor and Zetia.   Family Communication/Anticipated D/C date and plan/Code Status   DVT prophylaxis: Lovenox ordered. Code Status: Full Code.  Family Communication: No family currently at the bedside. Disposition Plan: Home when stable, likely several more days.   Medical Consultants:    Pulmonology   Procedures:    None  Anti-Infectives:    None  Subjective:   Patient reports that she is mildly short of breath, but feels better with the nonrebreather mask on. Denies cough or chest pain. No further syncopal episodes.  Objective:    Vitals:   07/21/16 0700 07/21/16 0730 07/21/16 0745 07/21/16 0748  BP: 124/74     Pulse: 108 119 117 112  Resp: 25 (!) 29 (!) 34 26  Temp:      TempSrc:      SpO2: 92% (!) 83% (!) 79% (!) 88%    Intake/Output Summary (Last 24 hours) at 07/21/16 0913 Last data filed at 07/21/16 0618  Gross per 24 hour  Intake                0 ml  Output              650 ml  Net             -650 ml   There were no vitals filed for this visit.  Exam: General exam: Appears Mildly uncomfortable with increased work of breathing. Respiratory system: Bilateral crackles. Labored breathing with tachypnea. Cardiovascular system: Tachycardic rate, regular rhythm. No JVD,  rubs, gallops or clicks. No murmurs. Gastrointestinal system: Abdomen is nondistended, soft and  nontender. No organomegaly or masses felt. Normal bowel sounds heard. Central nervous system: Alert and oriented. No focal neurological deficits. Extremities: No clubbing,  or cyanosis. No edema. Skin: No rashes, lesions or ulcers. Psychiatry: Judgement and insight appear normal. Mood & affect appropriate.   Data Reviewed:   I have personally reviewed following labs and imaging studies:  Labs: Basic Metabolic Panel:  Recent Labs Lab  07/20/16 1856 07/21/16 0423  NA 133* 133*  K 3.5 3.6  CL 98* 99*  CO2 26 24  GLUCOSE 257* 177*  BUN 8 5*  CREATININE 0.64 0.55  CALCIUM 8.0* 8.1*   GFR Estimated Creatinine Clearance: 55.8 mL/min (by C-G formula based on SCr of 0.55 mg/dL). Liver Function Tests:  Recent Labs Lab 07/20/16 1856  AST 23  ALT 23  ALKPHOS 82  BILITOT 0.8  PROT 5.5*  ALBUMIN 2.7*   CBC:  Recent Labs Lab 07/20/16 1856 07/21/16 0423  WBC 12.0* 12.1*  NEUTROABS 10.1*  --   HGB 14.0 14.5  HCT 42.2 43.9  MCV 85.4 85.6  PLT 196 195   CBG:  Recent Labs Lab 07/20/16 1955 07/21/16 0427  GLUCAP 247* 169*   Sepsis Labs:  Recent Labs Lab 07/20/16 1856 07/20/16 1911 07/20/16 2138 07/21/16 0423  PROCALCITON  --   --  0.10  --   WBC 12.0*  --   --  12.1*  LATICACIDVEN  --  1.60  --   --     Microbiology No results found for this or any previous visit (from the past 240 hour(s)).  Radiology: Dg Chest 2 View  Result Date: 07/20/2016 CLINICAL DATA:  Hypoxemia EXAM: CHEST  2 VIEW COMPARISON:  07/18/2016 FINDINGS: Cardiac shadow is stable. Patchy infiltrative changes are again identified throughout both lungs stable from the recent CT examination. No new focal abnormality is seen. No bony abnormality is noted. IMPRESSION: Patchy bilateral infiltrates stable from the recent CT. Electronically Signed   By: Inez Catalina M.D.   On: 07/20/2016 19:52    Medications:   . aspirin EC  81 mg Oral QODAY  . diltiazem  360 mg Oral Daily  . DULoxetine  60 mg Oral BID  . enoxaparin (LOVENOX) injection  40 mg Subcutaneous Q24H  . ezetimibe  10 mg Oral QHS  . guaiFENesin  600 mg Oral BID  . insulin aspart  0-20 Units Subcutaneous TID WC  . insulin aspart  0-5 Units Subcutaneous QHS  . insulin glargine  10 Units Subcutaneous QHS  . loratadine  10 mg Oral Daily  . nadolol  20 mg Oral Daily  . pantoprazole  40 mg Oral BID AC  . predniSONE  50 mg Oral Q breakfast  . psyllium  1 packet Oral BID   . rosuvastatin  5 mg Oral QHS  . sodium chloride flush  3 mL Intravenous Q12H   Continuous Infusions: . sodium chloride 100 mL/hr at 07/20/16 2244    Medical decision making is of high complexity and this patient is at high risk of deterioration, therefore this is a level 3 visit.  (> 4 problem points, >4 data points, high risk)   LOS: 0 days   Levii Hairfield  [facility] Pager [phone]. If unable to reach me by pager, please call my cell phone at [phone].  *Please refer to [email or web address], password [email or web address] to get updated schedule on who will round on this patient, as hospitalists switch teams weekly. If 7PM-7AM, please contact night-coverage at [email or web address], password [email or web address] for any overnight  needs.  07/21/2016, 9:13 AM

## 2016-07-21 NOTE — ED Notes (Signed)
This RN spoke with admitting MD and made her aware of need for increased oxygen. Will transport pt to SDU

## 2016-07-22 ENCOUNTER — Inpatient Hospital Stay (HOSPITAL_COMMUNITY): Payer: Medicare Other

## 2016-07-22 DIAGNOSIS — J9621 Acute and chronic respiratory failure with hypoxia: Secondary | ICD-10-CM

## 2016-07-22 DIAGNOSIS — J849 Interstitial pulmonary disease, unspecified: Secondary | ICD-10-CM

## 2016-07-22 DIAGNOSIS — R0600 Dyspnea, unspecified: Secondary | ICD-10-CM

## 2016-07-22 DIAGNOSIS — R0602 Shortness of breath: Secondary | ICD-10-CM

## 2016-07-22 DIAGNOSIS — E876 Hypokalemia: Secondary | ICD-10-CM | POA: Diagnosis not present

## 2016-07-22 LAB — ECHOCARDIOGRAM LIMITED
CHL CUP DOP CALC LVOT VTI: 21.7 cm
CHL CUP TV REG PEAK VELOCITY: 359 cm/s
FS: 41 % (ref 28–44)
IV/PV OW: 1.21
LA diam end sys: 34 mm
LA vol A4C: 30.7 ml
LADIAMINDEX: 1.98 cm/m2
LASIZE: 34 mm
LAVOL: 37.2 mL
LAVOLIN: 21.6 mL/m2
LDCA: 2.54 cm2
LV PW d: 8.92 mm — AB (ref 0.6–1.1)
LVOT SV: 55 mL
LVOT diameter: 18 mm
LVOT peak grad rest: 4 mmHg
LVOTPV: 101 cm/s
Lateral S' vel: 13.6 cm/s
RV sys press: 60 mmHg
TAPSE: 19.3 mm
TRMAXVEL: 359 cm/s
Weight: 2455.04 oz

## 2016-07-22 LAB — BASIC METABOLIC PANEL
ANION GAP: 9 (ref 5–15)
BUN: 6 mg/dL (ref 6–20)
CHLORIDE: 104 mmol/L (ref 101–111)
CO2: 24 mmol/L (ref 22–32)
CREATININE: 0.48 mg/dL (ref 0.44–1.00)
Calcium: 7.9 mg/dL — ABNORMAL LOW (ref 8.9–10.3)
GFR calc non Af Amer: >60 mL/min (ref >60)
GLUCOSE: 121 mg/dL — AB (ref 65–99)
POTASSIUM: 3 mmol/L — AB (ref 3.5–5.1)
Sodium: 137 mmol/L (ref 135–145)

## 2016-07-22 LAB — CBC
HCT: 41.2 % (ref 36.0–46.0)
Hemoglobin: 13.2 g/dL (ref 12.0–15.0)
MCH: 27.6 pg (ref 26.0–34.0)
MCHC: 32 g/dL (ref 30.0–36.0)
MCV: 86 fL (ref 78.0–100.0)
PLATELETS: 135 10*3/uL — AB (ref 150–400)
RBC: 4.79 MIL/uL (ref 3.87–5.11)
RDW: 15 % (ref 11.5–15.5)
WBC: 9.7 10*3/uL (ref 4.0–10.5)

## 2016-07-22 LAB — GLUCOSE, CAPILLARY
GLUCOSE-CAPILLARY: 163 mg/dL — AB (ref 65–99)
GLUCOSE-CAPILLARY: 115 mg/dL — AB (ref 65–99)
GLUCOSE-CAPILLARY: 318 mg/dL — AB (ref 65–99)
Glucose-Capillary: 191 mg/dL — ABNORMAL HIGH (ref 65–99)
Glucose-Capillary: 172 mg/dL — ABNORMAL HIGH (ref 65–99)

## 2016-07-22 LAB — CK TOTAL AND CKMB (NOT AT ARMC)
CK, MB: 2.1 ng/mL (ref 0.5–5.0)
RELATIVE INDEX: INVALID (ref 0.0–2.5)
Total CK: 16 U/L — ABNORMAL LOW (ref 38–234)

## 2016-07-22 LAB — SEDIMENTATION RATE: SED RATE: 62 mm/h — AB (ref 0–22)

## 2016-07-22 MED ORDER — PREDNISONE 10 MG PO TABS: 50.0000 mg | ORAL_TABLET | Freq: Every day | ORAL | Status: DC

## 2016-07-22 MED ORDER — PNEUMOCOCCAL VAC POLYVALENT 25 MCG/0.5ML IJ INJ
0.5000 mL | INJECTION | INTRAMUSCULAR | Status: DC
  Filled 2016-07-22 (×3): qty 0.5

## 2016-07-22 MED ORDER — INSULIN ASPART 100 UNIT/ML ~~LOC~~ SOLN
5.0000 [IU] | Freq: Three times a day (TID) | SUBCUTANEOUS | Status: DC
  Administered 2016-07-22 (×2): 5 [IU] via SUBCUTANEOUS

## 2016-07-22 MED ORDER — POTASSIUM CHLORIDE CRYS ER 20 MEQ PO TBCR
40.0000 meq | EXTENDED_RELEASE_TABLET | ORAL | Status: AC
  Administered 2016-07-22: 40 meq via ORAL
  Filled 2016-07-22 (×3): qty 2

## 2016-07-22 MED ORDER — SODIUM CHLORIDE 0.9 % IV SOLN
250.0000 mg | Freq: Four times a day (QID) | INTRAVENOUS | Status: DC
  Administered 2016-07-22 – 2016-07-25 (×12): 250 mg via INTRAVENOUS
  Filled 2016-07-22 (×13): qty 2

## 2016-07-22 MED ORDER — DEXTROSE 5 % IV SOLN
1.0000 g | INTRAVENOUS | Status: DC
  Administered 2016-07-22 – 2016-07-28 (×7): 1 g via INTRAVENOUS
  Filled 2016-07-22 (×7): qty 10

## 2016-07-22 MED ORDER — POTASSIUM CHLORIDE CRYS ER 20 MEQ PO TBCR
40.0000 meq | EXTENDED_RELEASE_TABLET | ORAL | Status: AC
  Administered 2016-07-22 (×2): 40 meq via ORAL
  Filled 2016-07-22 (×2): qty 2

## 2016-07-22 NOTE — Care Management Note (Signed)
Case Management Note  Patient Details  Name: COLITA FENECH MRN: [id number] Date of Birth: December 13, 1940  Subjective/Objective:  Pt admitted with SOB              Action/Plan:   PTA from home with husband.  Pt is on supplemental O2 at home 3L.   Expected Discharge Date:                  Expected Discharge Plan:     In-House Referral:     Discharge planning Services  CM Consult  Post Acute Care Choice:    Choice offered to:     DME Arranged:    DME Agency:     HH Arranged:    HH Agency:     Status of Service:  In process, will continue to follow  If discussed at Long Length of Stay Meetings, dates discussed:    Additional Comments:  Maryclare Labrador, RN 07/22/2016, 11:11 AM

## 2016-07-22 NOTE — Progress Notes (Addendum)
Progress Note    Kristina Schroeder  [id number] DOB: 1940-10-28  DOA: 07/20/2016 PCP: Cari Caraway, MD    Brief Narrative:   Chief complaint: Follow-up syncope  Kristina Schroeder is an 76 y.o. female with a PMH of interstitial lung disease, pulmonary fibrosis, chronic hypoxic respiratory failure on 3 L of home oxygen, diabetes, breast cancer, chronic left bundle branch block, dyslipidemia and fibromyalgia who was admitted 07/20/16 for evaluation of 2 syncopal events. She was noted to be orthostatic upon evaluation by EMS and received 1 L of normal saline. She also had an increased oxygen requirement requiring a nonrebreather mask to maintain her oxygen saturations. She saw her pulmonologist 07/19/16 and had a high resolution CT scan to 8/18 which showed patchy irregular areas of consolidation and groundglass opacities throughout the lungs, some of which are improved and some worsened compared to previous CT scan. Underlying pulmonary fibrosis abd multiple attenuation lesions in the liver again identified. Recommendations were to start a slow prednisone taper.  Assessment/Plan:   Principal Problems:   Syncope and collapse/Orthostatic hypotension Noted to be orthostatic on presentation. Placed on IV hydration. Worsening hypoxia may be contributory.    Acute on chronic respiratory failure in the setting of interstitial lung disease and pulmonary fibrosis Chest x-ray personally reviewed. Patchy bilateral infiltrates apparent.  F/U myositis panel.  Respiratory virus panel negative. Pulmonology following and has changed her prednisone to Solu-Medrol 1 g IV daily with plans to continue this for 3 days. Dopplers of the legs ordered to rule out PTE. 2-D echo ordered to evaluate RV and LV function. Remains on a nonrebreather mask but pulmonologist ordered HFNC 30 LPM.    Active Problems:   Left bundle branch hemiblock Monitoring on telemetry.    GERD (gastroesophageal reflux disease) Continue  Protonix.    Type 2 diabetes mellitus with stage III CKD and long-term current use of insulin (HCC) Currently being managed with Lantus 10 units daily, and insulin resistant sliding scale. CBGs 132-247. Will add meal coverage. May need to be more aggressive with insulin therapy given high-dose steroids. Will check hemoglobin A1c. Diabetes coordinator following.    Hyperlipidemia Continue Crestor and Zetia.    Hypokalemia Secondary to diuretic therapy. Replete.   Family Communication/Anticipated D/C date and plan/Code Status   DVT prophylaxis: Lovenox ordered. Code Status: Full Code.  Family Communication: Husband updated at the bedside. Disposition Plan: Home when stable, likely several more days.   Medical Consultants:    Pulmonology   Procedures:    None  Anti-Infectives:    None  Subjective:   Patient reports that she continues to be short of breath. Denies cough or chest pain. No further syncopal episodes.  Objective:    Vitals:   07/22/16 0300 07/22/16 0331 07/22/16 0400 07/22/16 0500  BP: (!) 115/95  121/66   Pulse: 81  83   Resp: 20  (!) 21   Temp:  98.4 F (36.9 C)    TempSrc:  Oral    SpO2: 98%  93%   Weight:    69.6 kg (153 lb 7 oz)    Intake/Output Summary (Last 24 hours) at 07/22/16 0733 Last data filed at 07/22/16 0700  Gross per 24 hour  Intake          3646.67 ml  Output             3000 ml  Net           646.67 ml   [employer]  07/22/16 0500  Weight: 69.6 kg (153 lb 7 oz)    Exam: General exam: Increased work of breathing, but otherwise comfortable. Respiratory system: Relatively clear today. Labored breathing with tachypnea. Cardiovascular system: Tachycardic rate, regular rhythm. No JVD,  rubs, gallops or clicks. No murmurs. Gastrointestinal system: Abdomen is nondistended, soft and nontender. No organomegaly or masses felt. Normal bowel sounds heard. Central nervous system: Alert and oriented. No focal neurological  deficits. Extremities: No clubbing, or cyanosis. No edema. Skin: No rashes, lesions or ulcers. Psychiatry: Judgement and insight appear normal. Mood & affect appropriate.   Data Reviewed:   I have personally reviewed following labs and imaging studies:  Labs: Basic Metabolic Panel:  Recent Labs Lab 07/20/16 1856 07/21/16 0423 07/22/16 0324  NA 133* 133* 137  K 3.5 3.6 3.0*  CL 98* 99* 104  CO2 26 24 24   GLUCOSE 257* 177* 121*  BUN 8 5* 6  CREATININE 0.64 0.55 0.48  CALCIUM 8.0* 8.1* 7.9*   GFR Estimated Creatinine Clearance: 56.2 mL/min (by C-G formula based on SCr of 0.48 mg/dL). Liver Function Tests:  Recent Labs Lab 07/20/16 1856  AST 23  ALT 23  ALKPHOS 82  BILITOT 0.8  PROT 5.5*  ALBUMIN 2.7*   CBC:  Recent Labs Lab 07/20/16 1856 07/21/16 0423 07/22/16 0324  WBC 12.0* 12.1* 9.7  NEUTROABS 10.1*  --   --   HGB 14.0 14.5 13.2  HCT 42.2 43.9 41.2  MCV 85.4 85.6 86.0  PLT 196 195 135*   CBG:  Recent Labs Lab 07/20/16 1955 07/21/16 0427 07/21/16 1150 07/21/16 1636 07/21/16 2123  GLUCAP 247* 169* 132* 261* 166*   Sepsis Labs:  Recent Labs Lab 07/20/16 1856 07/20/16 1911 07/20/16 2138 07/21/16 0423 07/22/16 0324  PROCALCITON  --   --  0.10  --   --   WBC 12.0*  --   --  12.1* 9.7  LATICACIDVEN  --  1.60  --   --   --     Microbiology Recent Results (from the past 240 hour(s))  Respiratory Panel by PCR     Status: None   Collection Time: 07/20/16  6:56 PM  Result Value Ref Range Status   Adenovirus NOT DETECTED NOT DETECTED Final   Coronavirus 229E NOT DETECTED NOT DETECTED Final   Coronavirus HKU1 NOT DETECTED NOT DETECTED Final   Coronavirus NL63 NOT DETECTED NOT DETECTED Final   Coronavirus OC43 NOT DETECTED NOT DETECTED Final   Metapneumovirus NOT DETECTED NOT DETECTED Final   Rhinovirus / Enterovirus NOT DETECTED NOT DETECTED Final   Influenza A NOT DETECTED NOT DETECTED Final   Influenza B NOT DETECTED NOT DETECTED Final     Parainfluenza Virus 1 NOT DETECTED NOT DETECTED Final   Parainfluenza Virus 2 NOT DETECTED NOT DETECTED Final   Parainfluenza Virus 3 NOT DETECTED NOT DETECTED Final   Parainfluenza Virus 4 NOT DETECTED NOT DETECTED Final   Respiratory Syncytial Virus NOT DETECTED NOT DETECTED Final   Bordetella pertussis NOT DETECTED NOT DETECTED Final   Chlamydophila pneumoniae NOT DETECTED NOT DETECTED Final   Mycoplasma pneumoniae NOT DETECTED NOT DETECTED Final  MRSA PCR Screening     Status: None   Collection Time: 07/21/16  8:52 AM  Result Value Ref Range Status   MRSA by PCR NEGATIVE NEGATIVE Final    Comment:        The GeneXpert MRSA Assay (FDA approved for NASAL specimens only), is one component of a comprehensive MRSA colonization surveillance program.  It is not intended to diagnose MRSA infection nor to guide or monitor treatment for MRSA infections.     Radiology: Dg Chest 2 View  Result Date: 07/20/2016 CLINICAL DATA:  Hypoxemia EXAM: CHEST  2 VIEW COMPARISON:  07/18/2016 FINDINGS: Cardiac shadow is stable. Patchy infiltrative changes are again identified throughout both lungs stable from the recent CT examination. No new focal abnormality is seen. No bony abnormality is noted. IMPRESSION: Patchy bilateral infiltrates stable from the recent CT. Electronically Signed   By: Inez Catalina M.D.   On: 07/20/2016 19:52    Medications:   . aspirin EC  81 mg Oral QODAY  . diltiazem  360 mg Oral Daily  . DULoxetine  60 mg Oral BID  . enoxaparin (LOVENOX) injection  40 mg Subcutaneous Q24H  . ezetimibe  10 mg Oral QHS  . guaiFENesin  600 mg Oral BID  . insulin aspart  0-20 Units Subcutaneous TID WC  . insulin aspart  0-5 Units Subcutaneous QHS  . insulin glargine  10 Units Subcutaneous QHS  . loratadine  10 mg Oral Daily  . mouth rinse  15 mL Mouth Rinse BID  . nadolol  20 mg Oral Daily  . pantoprazole  40 mg Oral BID AC  . potassium chloride  40 mEq Oral Q4H  . predniSONE  50 mg  Oral Q breakfast  . psyllium  1 packet Oral BID  . rosuvastatin  5 mg Oral QHS  . sodium chloride flush  3 mL Intravenous Q12H   Continuous Infusions: . sodium chloride 100 mL/hr at 07/21/16 2209    Medical decision making is of high complexity and this patient is at high risk of deterioration, therefore this is a level 3 visit.  (> 4 problem points, >4 data points, high risk)   LOS: 1 day   RAMA,CHRISTINA  [facility] Pager [phone]. If unable to reach me by pager, please call my cell phone at [phone].  *Please refer to [email or web address], password [email or web address] to get updated schedule on who will round on this patient, as hospitalists switch teams weekly. If 7PM-7AM, please contact night-coverage at [email or web address], password [email or web address] for any overnight needs.  07/22/2016, 7:33 AM

## 2016-07-22 NOTE — Progress Notes (Addendum)
Spo2 decreased to 81% on salter HFNC 15L, placed pt back on NRB

## 2016-07-22 NOTE — Plan of Care (Signed)
Problem: Activity: Goal: Risk for activity intolerance will decrease Outcome: Not Progressing Pt is unable to participate in increased activity due to shortness of breath.   Problem: Bowel/Gastric: Goal: Will not experience complications related to bowel motility Outcome: Progressing Pt had bowel movement today.

## 2016-07-22 NOTE — Procedures (Signed)
Heated HFNC now available.  Pt placed on 30L Fio2 100%, spo2 deceased to 82%, inc to 40L, spo2 continued to decrease in 70's, pt stated it was uncomfortable and too much flow, pt placed back on 100% NRB and is tolerating well, spo2 89%, RT will monitor

## 2016-07-22 NOTE — Procedures (Signed)
Pt placed on salter HFNC at this time, heated HFNC unavailable at this time, MD aware. RT will monitor

## 2016-07-22 NOTE — Progress Notes (Signed)
Name: Kristina Schroeder MRN: [id number] DOB: [birth year]    ADMISSION DATE:  07/20/2016 CONSULTATION DATE:  07/20/2016  REFERRING MD :  Dr. Eulas Post  CHIEF COMPLAINT:  SOB, ILD  HISTORY OF PRESENT ILLNESS:   76 yo female never smoker with hx fibromyalgia, chronic LBBB, sinus tachycardia regulated with B blocker and diltiazem, DM, GERD.  She was recently diagnosed with an ILD of unknown etiology and placed on 78m then increased to 635mof prednisone which she has been on for 1 month.  She was seen by her pulmonologist yesterday.  She also has liver lesions concerning for malignancy.   She states her SOB symptoms started in 05/2016 and have been only slightly better on the steroids.  She is on 3L of O2 with rest and exertion.  She reports that her O2 sats drop with exertion into the high 80s but she does not increase her O2.  Today she was walking to the bathroom and felt acutely SOB and diaphoretic.  She denies, chest pain, or vertigo.  She called for her husband and lost consciousness.  Husband denies any seizure like activity, bowel, bladder incontinence and she regained consciousness in seconds.   SUBJECTIVE:  Pt "feels better" and her brain is less foggy.  Same SOB since 05/2016. Dry cough. (-) cp/fevers/chills (-) diarrhea  VITAL SIGNS: Temp:  [97.8 F (36.6 C)-99.4 F (37.4 C)] 99.4 F (37.4 C) (02/12 0700) Pulse Rate:  [81-112] 83 (02/12 0400) Resp:  [17-27] 21 (02/12 0400) BP: (101-139)/(59-95) 121/66 (02/12 0400) SpO2:  [80 %-99 %] 93 % (02/12 0400) FiO2 (%):  [55 %] 55 % (02/11 2000) Weight:  [69.6 kg (153 lb 7 oz)] 69.6 kg (153 lb 7 oz) (02/12 0500)  PHYSICAL EXAMINATION: General:  NAD, well nourished. Neuro:  CN grossly intact. (-) lateralizing signs.  HEENT:  NCAT, MM dry Cardiovascular:  RRR, s1/s2 no m/r/g.  Trace to 1+ pitting edema b/l LE Lungs:  Diffuse, bilateral fine rales noted, no w/r. (-) accessory muscle use.  Abdomen:  Soft, non-tender, normal bowel  sounds Musculoskeletal:  Normal bulk and tone, joints normal Skin:  No rashes   Recent Labs Lab 07/20/16 1856 07/21/16 0423 07/22/16 0324  NA 133* 133* 137  K 3.5 3.6 3.0*  CL 98* 99* 104  CO2 _0 BUN 8 5* 6  CREATININE 0.64 0.55 0.48  GLUCOSE 257* 177* 121*    Recent Labs Lab 07/20/16 1856 07/21/16 0423 07/22/16 0324  HGB 14.0 14.5 13.2  HCT 42.2 43.9 41.2  WBC 12.0* 12.1* 9.7  PLT 196 195 135*   Dg Chest 2 View  Result Date: 07/20/2016 CLINICAL DATA:  Hypoxemia EXAM: CHEST  2 VIEW COMPARISON:  07/18/2016 FINDINGS: Cardiac shadow is stable. Patchy infiltrative changes are again identified throughout both lungs stable from the recent CT examination. No new focal abnormality is seen. No bony abnormality is noted. IMPRESSION: Patchy bilateral infiltrates stable from the recent CT. Electronically Signed   By: MaInez Catalina.D.   On: 07/20/2016 19:52    ASSESSMENT / PLAN: Acute on Chronic Hypoxemic Respiratory Failure 2/2 Diffuse Bilateral Infiltrates  S/P Transbronchial Biopsy in 05/31/16 showing Focal Interstitial Fibrosis  CTD work up (-). Lymphocytic predominance BAL.   Infiltrates "waxing and waning" with serial Chest CT scan  Pt with breast CA 15 yrs ago and also with liver lesions now concerning for CA.  Differentials : 1. IPF/NSIP.  CT scan is not typical for IPF but the bases show  some subpleural fibrosis.  2. Cryptogenic organizing PNA 3. Eosinophilic PNA 4. Less likely infectious as BAL was (-) in 05/2016 5. She can have a VTE on top of this process as her o2 requirement increased from 3L to [facility] since Friday.  6. Certainly, she can have CAD/CHF exacerbation.  Echo with 45=50% EF and diffuse hypokinesis back in 05/2016 7. Possible malignancy given lesions in the liver as well. Pt with breast CA, L, treated 2003.   Plan : 1.  I extensively discussed the differentials and plan with the pt.  She understands what is going on.  She is too sick for a surgical  biopsy and may cause harm than good at this point. She is too unstable to get a CTA 2/2 her high o2 requirement.  She wants to be a full code but only if it will be a temporary thing.  She does not want a tracheostomy.  I need to readdress code status with pt and her husband again.  2.  She did not necessarily feel better with prednsione at 60 mg/d since I feel she has been hypoxemic all along.  Her ct scan findings appear inflammatory in nature. Plan to pulse dose her with medrol 1gm IV daily for 3 days.  Likely will need to be on insulin drip as well.  3.  Check dopplers of legs to R/O VTE. 4. Check limited echo, to assess the RV and LV fxn.  5. Switch her to HFNC, 30 LPM, 100% Fio2, keep sats > 88%.  6. DVT prophylaxis. 7. Keep euvolemic.  Suggest to D/C IVF.  8. Check labs for myositis. (CKs, anti Jo, ESR, CRP, aldolase, ANA)    I spent  30  minutes of Critical Care time with this patient today.   Monica Becton, MD 07/22/2016, 9:34 AM [facility] Pager [phone] After 3 pm or if no answer, call [phone]

## 2016-07-22 NOTE — Progress Notes (Signed)
eLink Physician-Brief Progress Note Patient Name: Kristina Schroeder DOB: Feb 14, 1941 MRN: [id number]   Date of Service  07/22/2016  HPI/Events of Note  GNR in urine  eICU Interventions  ceftx empiric     Intervention Category Intermediate Interventions: Diagnostic test evaluation  Cuauhtemoc Huegel V. 07/22/2016, 4:50 PM

## 2016-07-22 NOTE — Progress Notes (Signed)
  Echocardiogram 2D Echocardiogram has been performed.  Jennette Dubin 07/22/2016, 3:26 PM

## 2016-07-22 NOTE — Progress Notes (Signed)
VASCULAR LAB PRELIMINARY  PRELIMINARY  PRELIMINARY  PRELIMINARY  Bilateral lower extremity venous duplex completed.    Preliminary report:  There is no DVT or SVT noted in the bilateral lower extremities.   Alexus Michael, RVT 07/22/2016, 10:38 AM

## 2016-07-22 NOTE — Progress Notes (Signed)
Inpatient Diabetes Program Recommendations  AACE/ADA: New Consensus Statement on Inpatient Glycemic Control (2015)  Target Ranges:  Prepandial:   less than 140 mg/dL      Peak postprandial:   less than 180 mg/dL (1-2 hours)      Critically ill patients:  140 - 180 mg/dL   Lab Results  Component Value Date   GLUCAP 115 (H) 07/22/2016    Review of Glycemic Control Results for GREYDI, STEGNER (MRN [id number]) as of 07/22/2016 10:04  Ref. Range 07/21/2016 04:27 07/21/2016 11:50 07/21/2016 16:36 07/21/2016 21:23 07/22/2016 08:00  Glucose-Capillary Latest Ref Range: 65 - 99 mg/dL 169 (H) 132 (H) 261 (H) 166 (H) 115 (H)   Diabetes history: DM2 Outpatient Diabetes medications: Lantus 10 units qd + Amaryl 2 mg bid Current orders for Inpatient glycemic control: Lantus 10 units qd + Novolog 5 units tid + Novolog correction 0-20 units  Inpatient Diabetes Program Recommendations:  Please consider A1c to determine prehospital glycemic control. Will follow.  Thank you, Nani Gasser. Allianna Beaubien, RN, MSN, CDE Inpatient Glycemic Control Team Team Pager [phone] (8am-5pm) 07/22/2016 10:15 AM

## 2016-07-23 DIAGNOSIS — J9620 Acute and chronic respiratory failure, unspecified whether with hypoxia or hypercapnia: Secondary | ICD-10-CM

## 2016-07-23 DIAGNOSIS — I2789 Other specified pulmonary heart diseases: Secondary | ICD-10-CM

## 2016-07-23 DIAGNOSIS — R55 Syncope and collapse: Secondary | ICD-10-CM

## 2016-07-23 DIAGNOSIS — J8410 Pulmonary fibrosis, unspecified: Principal | ICD-10-CM

## 2016-07-23 DIAGNOSIS — IMO0002 Reserved for concepts with insufficient information to code with codable children: Secondary | ICD-10-CM

## 2016-07-23 DIAGNOSIS — N390 Urinary tract infection, site not specified: Secondary | ICD-10-CM | POA: Diagnosis present

## 2016-07-23 DIAGNOSIS — K219 Gastro-esophageal reflux disease without esophagitis: Secondary | ICD-10-CM

## 2016-07-23 DIAGNOSIS — E1122 Type 2 diabetes mellitus with diabetic chronic kidney disease: Secondary | ICD-10-CM

## 2016-07-23 DIAGNOSIS — E1165 Type 2 diabetes mellitus with hyperglycemia: Secondary | ICD-10-CM

## 2016-07-23 DIAGNOSIS — N3000 Acute cystitis without hematuria: Secondary | ICD-10-CM

## 2016-07-23 DIAGNOSIS — I272 Pulmonary hypertension, unspecified: Secondary | ICD-10-CM

## 2016-07-23 DIAGNOSIS — N183 Chronic kidney disease, stage 3 (moderate): Secondary | ICD-10-CM

## 2016-07-23 DIAGNOSIS — Z794 Long term (current) use of insulin: Secondary | ICD-10-CM

## 2016-07-23 LAB — URINE CULTURE

## 2016-07-23 LAB — ALDOLASE: Aldolase: 11.3 U/L — ABNORMAL HIGH (ref 3.3–10.3)

## 2016-07-23 LAB — GLUCOSE, CAPILLARY
GLUCOSE-CAPILLARY: 293 mg/dL — AB (ref 65–99)
Glucose-Capillary: 198 mg/dL — ABNORMAL HIGH (ref 65–99)
Glucose-Capillary: 261 mg/dL — ABNORMAL HIGH (ref 65–99)
Glucose-Capillary: 347 mg/dL — ABNORMAL HIGH (ref 65–99)

## 2016-07-23 LAB — HEMOGLOBIN A1C
Hgb A1c MFr Bld: 11.3 % — ABNORMAL HIGH (ref 4.8–5.6)
MEAN PLASMA GLUCOSE: 278 mg/dL

## 2016-07-23 LAB — ANTI-JO 1 ANTIBODY, IGG: Anti JO-1: <0.2 AI (ref 0.0–0.9)

## 2016-07-23 LAB — HIGH SENSITIVITY CRP: CRP, High Sensitivity: 212.72 mg/L — ABNORMAL HIGH (ref 0.00–3.00)

## 2016-07-23 LAB — ANTINUCLEAR ANTIBODIES, IFA: ANA Ab, IFA: NEGATIVE

## 2016-07-23 MED ORDER — INSULIN GLARGINE 100 UNIT/ML ~~LOC~~ SOLN
20.0000 [IU] | Freq: Every day | SUBCUTANEOUS | Status: DC
  Administered 2016-07-23: 20 [IU] via SUBCUTANEOUS
  Filled 2016-07-23: qty 0.2

## 2016-07-23 MED ORDER — INSULIN ASPART 100 UNIT/ML ~~LOC~~ SOLN
8.0000 [IU] | Freq: Three times a day (TID) | SUBCUTANEOUS | Status: DC
  Administered 2016-07-23 – 2016-07-30 (×20): 8 [IU] via SUBCUTANEOUS

## 2016-07-23 MED ORDER — INSULIN ASPART 100 UNIT/ML ~~LOC~~ SOLN
7.0000 [IU] | Freq: Three times a day (TID) | SUBCUTANEOUS | Status: DC
  Administered 2016-07-23 (×2): 7 [IU] via SUBCUTANEOUS

## 2016-07-23 NOTE — Progress Notes (Signed)
Progress Note    MARLEA VERZOSA  [id number] DOB: 1941/02/19  DOA: 07/20/2016 PCP: Cari Caraway, MD    Brief Narrative:   Chief complaint: Follow-up syncope  LETHA CURREY is an 76 y.o. female with a PMH of interstitial lung disease, pulmonary fibrosis, chronic hypoxic respiratory failure on 3 L of home oxygen, diabetes, breast cancer, chronic left bundle branch block, dyslipidemia and fibromyalgia who was admitted 07/20/16 for evaluation of 2 syncopal events. She was noted to be orthostatic upon evaluation by EMS and received 1 L of normal saline. She also had an increased oxygen requirement requiring a nonrebreather mask to maintain her oxygen saturations. She saw her pulmonologist 07/19/16 and had a high resolution CT scan to 8/18 which showed patchy irregular areas of consolidation and groundglass opacities throughout the lungs, some of which are improved and some worsened compared to previous CT scan. Underlying pulmonary fibrosis and multiple attenuation lesions in the liver again identified. Recommendations were to start a slow prednisone taper.  Assessment/Plan:   Principal Problems:   Syncope and collapse/Orthostatic hypotension Noted to be orthostatic on presentation. Placed on IV hydration. Worsening hypoxia may be contributory.    Acute on chronic respiratory failure in the setting of interstitial lung disease and pulmonary fibrosis/moderate pulmonary hypertension Chest x-ray done 07/20/16 is as pictured below, patchy bilateral infiltrates apparent.  F/U myositis panel.  Respiratory virus panel negative. Pulmonology following and has changed her prednisone to Solu-Medrol 1 g IV daily with plans to continue this for 3 days (on day #2). Dopplers negative for DVT. 2-D echo showed moderate pulmonary hypertension, EF 55-60%. Remains on high flow oxygen.    Active Problems:   Left bundle branch hemiblock Monitoring on telemetry.    GERD (gastroesophageal reflux disease) Continue  Protonix.    Type 2 diabetes mellitus uncontrolled, with long-term current use of insulin (HCC) and stage III CKD Currently being managed with Lantus 10 units daily, and insulin resistant sliding scale. CBGs 115-318. Will increase meal coverage to 7 units and increase Lantus to 20 units given high-dose steroids. May need to be more aggressive with insulin therapy given high-dose steroids. Hemoglobin A1c 11.3% indicating poor control. Diabetes coordinator following. 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    Hyperlipidemia Continue Crestor and Zetia.    Hypokalemia Secondary to diuretic therapy. Repleted.    UTI Urine culture growing gram-negative rods. Empiric ceftriaxone started.   Family Communication/Anticipated D/C date and plan/Code Status   DVT prophylaxis: Lovenox ordered. Code Status: Full Code.  Family Communication: Husband updated at the bedside. Disposition Plan: Home when stable, likely several more days.   Medical Consultants:    Pulmonology   Procedures:    2 D Echo 07/22/16  Study Conclusions  - Left ventricle: The cavity size was normal. Systolic function was   normal. The estimated ejection fraction was in the range of 55%   to 60%. Images were inadequate for LV wall motion assessment. The   study is not technically sufficient to allow evaluation of LV   diastolic function. - Mitral valve: There was mild regurgitation. - Right ventricle: The cavity size was mildly dilated. Wall   thickness was normal. Systolic function was mildly reduced. - Tricuspid valve: There was mild regurgitation. - Pulmonary arteries: PA peak pressure: 60 mm Hg (S).  Impressions:  - The right ventricular systolic pressure was increased consistent   with moderate pulmonary hypertension.   Bilateral lower extremity venous duplex  Preliminary report:  There is no DVT or SVT noted in the bilateral lower extremities.    Anti-Infectives:    Rocephin 07/22/16--->  Subjective:   Patient reports that she continues to be short of breath. Denies cough or chest pain. No further syncopal episodes.  Objective:    Vitals:   07/23/16 0400 07/23/16 0500 07/23/16 0600 07/23/16 0700  BP: 133/77 115/66 130/66 126/63  Pulse: 67 69 66 71  Resp: 19 20 20 18   Temp: 97.1 F (36.2 C)     TempSrc: Axillary     SpO2: 93% 96% 95% 96%  Weight:  68.2 kg (150 lb 5.7 oz)    Height:  5' 2.5" (1.588 m)      Intake/Output Summary (Last 24 hours) at 07/23/16 0729 Last data filed at 07/23/16 0530  Gross per 24 hour  Intake              856 ml  Output             2600 ml  Net            -1744 ml   Filed Weights   07/22/16 0500 07/23/16 0500  Weight: 69.6 kg (153 lb 7 oz) 68.2 kg (150 lb 5.7 oz)    Exam: General exam: Increased work of breathing, but otherwise comfortable. Non-rebreather mask on. Respiratory system: Relatively clear today. Labored breathing with tachypnea. Cardiovascular system: Tachycardic rate, regular rhythm. No JVD,  rubs, gallops or clicks. No murmurs. Gastrointestinal system: Abdomen is nondistended, soft and nontender. No organomegaly or masses felt. Normal bowel sounds heard. Central nervous system: Alert and oriented. No focal neurological deficits. Extremities: No clubbing, or cyanosis. No edema. Skin: No rashes, lesions or ulcers. Psychiatry: Judgement and insight appear normal. Mood & affect appropriate.   Data Reviewed:   I have personally reviewed following labs and imaging studies:  Labs: Basic Metabolic Panel:  Recent Labs Lab 07/20/16 1856 07/21/16 0423 07/22/16 0324  NA 133* 133* 137  K 3.5 3.6 3.0*  CL 98* 99* 104  CO2 26 24 24   GLUCOSE 257* 177* 121*  BUN 8 5* 6  CREATININE 0.64 0.55 0.48  CALCIUM 8.0* 8.1* 7.9*   GFR Estimated Creatinine Clearance: 55.7 mL/min (by C-G formula based on SCr of 0.48 mg/dL). Liver Function Tests:  Recent Labs Lab  07/20/16 1856  AST 23  ALT 23  ALKPHOS 82  BILITOT 0.8  PROT 5.5*  ALBUMIN 2.7*   CBC:  Recent Labs Lab 07/20/16 1856 07/21/16 0423 07/22/16 0324  WBC 12.0* 12.1* 9.7  NEUTROABS 10.1*  --   --   HGB 14.0 14.5 13.2  HCT 42.2 43.9 41.2  MCV 85.4 85.6 86.0  PLT 196 195 135*   CBG:  Recent Labs Lab 07/21/16 2123 07/22/16 0800 07/22/16 1130 07/22/16 1530 07/22/16 2125  GLUCAP 166* 115* 191* 172* 318*   Sepsis Labs:  Recent Labs Lab 07/20/16 1856 07/20/16 1911 07/20/16 2138 07/21/16 0423 07/22/16 0324  PROCALCITON  --   --  0.10  --   --   WBC 12.0*  --   --  12.1* 9.7  LATICACIDVEN  --  1.60  --   --   --     Microbiology Recent Results (from the past 240 hour(s))  Respiratory Panel by PCR     Status: None   Collection Time: 07/20/16  6:56 PM  Result Value Ref Range Status   Adenovirus NOT DETECTED NOT DETECTED Final   Coronavirus 229E NOT  DETECTED NOT DETECTED Final   Coronavirus HKU1 NOT DETECTED NOT DETECTED Final   Coronavirus NL63 NOT DETECTED NOT DETECTED Final   Coronavirus OC43 NOT DETECTED NOT DETECTED Final   Metapneumovirus NOT DETECTED NOT DETECTED Final   Rhinovirus / Enterovirus NOT DETECTED NOT DETECTED Final   Influenza A NOT DETECTED NOT DETECTED Final   Influenza B NOT DETECTED NOT DETECTED Final   Parainfluenza Virus 1 NOT DETECTED NOT DETECTED Final   Parainfluenza Virus 2 NOT DETECTED NOT DETECTED Final   Parainfluenza Virus 3 NOT DETECTED NOT DETECTED Final   Parainfluenza Virus 4 NOT DETECTED NOT DETECTED Final   Respiratory Syncytial Virus NOT DETECTED NOT DETECTED Final   Bordetella pertussis NOT DETECTED NOT DETECTED Final   Chlamydophila pneumoniae NOT DETECTED NOT DETECTED Final   Mycoplasma pneumoniae NOT DETECTED NOT DETECTED Final  Culture, Urine     Status: Abnormal (Preliminary result)   Collection Time: 07/20/16  7:03 PM  Result Value Ref Range Status   Specimen Description URINE, RANDOM  Final   Special Requests  ADDED AT 0256 ON [id number]  Final   Culture (A)  Final    >=100,000 COLONIES/mL GRAM NEGATIVE RODS IDENTIFICATION AND SUSCEPTIBILITIES TO FOLLOW    Report Status PENDING  Incomplete  MRSA PCR Screening     Status: None   Collection Time: 07/21/16  8:52 AM  Result Value Ref Range Status   MRSA by PCR NEGATIVE NEGATIVE Final    Comment:        The GeneXpert MRSA Assay (FDA approved for NASAL specimens only), is one component of a comprehensive MRSA colonization surveillance program. It is not intended to diagnose MRSA infection nor to guide or monitor treatment for MRSA infections.     Radiology: No results found.  Medications:   . aspirin EC  81 mg Oral QODAY  . cefTRIAXone (ROCEPHIN)  IV  1 g Intravenous Q24H  . diltiazem  360 mg Oral Daily  . DULoxetine  60 mg Oral BID  . enoxaparin (LOVENOX) injection  40 mg Subcutaneous Q24H  . ezetimibe  10 mg Oral QHS  . guaiFENesin  600 mg Oral BID  . insulin aspart  0-20 Units Subcutaneous TID WC  . insulin aspart  0-5 Units Subcutaneous QHS  . insulin aspart  5 Units Subcutaneous TID WC  . insulin glargine  10 Units Subcutaneous QHS  . loratadine  10 mg Oral Daily  . mouth rinse  15 mL Mouth Rinse BID  . methylPREDNISolone (SOLU-MEDROL) injection  250 mg Intravenous Q6H  . nadolol  20 mg Oral Daily  . pantoprazole  40 mg Oral BID AC  . pneumococcal 23 valent vaccine  0.5 mL Intramuscular Tomorrow-1000  . psyllium  1 packet Oral BID  . rosuvastatin  5 mg Oral QHS  . sodium chloride flush  3 mL Intravenous Q12H   Continuous Infusions: . sodium chloride 10 mL (07/22/16 1438)    Medical decision making is of high complexity and this patient is at high risk of deterioration, therefore this is a level 3 visit.  (> 4 problem points, >4 data points, high risk)   LOS: 2 days   RAMA,CHRISTINA  [facility] Pager [phone]. If unable to reach me by pager, please call my cell phone at [phone].  *Please refer to [email or web address],  password [email or web address] to get updated schedule on who will round on this patient, as hospitalists switch teams weekly. If 7PM-7AM, please contact night-coverage at [email or web address], password [email or web address] for any overnight needs.  07/23/2016, 7:29 AM

## 2016-07-23 NOTE — Progress Notes (Signed)
Name: Kristina Schroeder MRN: [id number] DOB: 1941/03/28    ADMISSION DATE:  07/20/2016 CONSULTATION DATE:  07/20/2016  REFERRING MD :  Dr. Eulas Post  CHIEF COMPLAINT:  SOB, ILD  HISTORY OF PRESENT ILLNESS:   76 yo female never smoker with hx fibromyalgia, chronic LBBB, sinus tachycardia regulated with B blocker and diltiazem, DM, GERD.  She was recently diagnosed with an ILD of unknown etiology and placed on 55m then increased to 641mof prednisone which she has been on for 1 month.  She was seen by her pulmonologist yesterday.  She also has liver lesions concerning for malignancy.   She states her SOB symptoms started in 05/2016 and have been only slightly better on the steroids.  She is on 3L of O2 with rest and exertion.  She reports that her O2 sats drop with exertion into the high 80s but she does not increase her O2.  Today she was walking to the bathroom and felt acutely SOB and diaphoretic.  She denies, chest pain, or vertigo.  She called for her husband and lost consciousness.  Husband denies any seizure like activity, bowel, bladder incontinence and she regained consciousness in seconds.   SUBJECTIVE:  Pt is better today. Less SOB. With NRBM on.  She did NOT tolerate HFNC as he felt more SOB and her o2 sats dropped.  Eating some.   VITAL SIGNS: Temp:  [97 F (36.1 C)-98.4 F (36.9 C)] 98.4 F (36.9 C) (02/13 0700) Pulse Rate:  [62-111] 72 (02/13 0800) Resp:  [17-30] 21 (02/13 0800) BP: (103-138)/(46-93) 127/75 (02/13 0800) SpO2:  [89 %-98 %] 93 % (02/13 0800) FiO2 (%):  [15 %-100 %] 100 % (02/12 1147) Weight:  [68.2 kg (150 lb 5.7 oz)] 68.2 kg (150 lb 5.7 oz) (02/13 0500)  PHYSICAL EXAMINATION: General:  NAD, well nourished. Comfortable.  Neuro:  CN grossly intact. (-) lateralizing signs.  HEENT:  NCAT, MM dry Cardiovascular:  RRR, s1/s2 no m/r/g.  Trace to 1+ pitting edema b/l LE Lungs:  Crackles mid-bases.  no wheezing/rhonchi.  (-) accessory muscle use.  Abdomen:  Soft,  non-tender, normal bowel sounds Musculoskeletal:  Normal bulk and tone, joints normal Skin:  No rashes   Recent Labs Lab 07/20/16 1856 07/21/16 0423 07/22/16 0324  NA 133* 133* 137  K 3.5 3.6 3.0*  CL 98* 99* 104  CO2 26 24 24   BUN 8 5* 6  CREATININE 0.64 0.55 0.48  GLUCOSE 257* 177* 121*    Recent Labs Lab 07/20/16 1856 07/21/16 0423 07/22/16 0324  HGB 14.0 14.5 13.2  HCT 42.2 43.9 41.2  WBC 12.0* 12.1* 9.7  PLT 196 195 135*   No results found.  ASSESSMENT / PLAN: Acute on Chronic Hypoxemic Respiratory Failure 2/2 Diffuse Bilateral Infiltrates  S/P Transbronchial Biopsy in 05/31/16 showing Focal Interstitial Fibrosis  CTD work up (-). Lymphocytic predominance in BAL (05/2016)  Infiltrates "waxing and waning" with serial Chest CT scan  Pt with breast CA 15 yrs ago and also with liver lesions now concerning for CA.  Differentials : 1. IPF/NSIP.  CT scan is not typical for IPF but the bases show some subpleural fibrosis.  2. Cryptogenic organizing PNA 3. Eosinophilic PNA 4. Less likely infectious as BAL was (-) in 05/2016 5. She can have a VTE on top of this process as [facility]. Dopplers preliminarily are (-) for DVT, SVT. This is less likely at this point.   6. Certainly,  she can have CAD/CHF exacerbation.  Echo with 45-50% EF and diffuse hypokinesis back in 05/2016. Rpt Echo in 07/2015 with EF better at 55%. RV fxn mildly decreased. This is less likely with her echo being better this admission.  7. Possible malignancy given lesions in the liver as well. Pt with breast CA, L, treated 2003.   Plan : 1.  I think at the end of the day, this will be " IPF" and she is having "IPF exacerbation".  Her chest ct scan however is NOT typical for IPF.  Her ESR and CRP are elevated.  If she gets worse and gets intubated, she will need a surgical lung biopsy.  2.  She is better over all today. Cont with pulse steroids.  Plan for 1 g/day  Medrol  for 3 days.  3. Cont with NRBM, keep o2 sats > 88%.  4. She is OK with short term intubation. I discussed with her potentially poor prognosis with IPF.  They understand but are not ready to limit her code status.  She was very fxnal prior to Dec 2017.  5. Keep Rocephin for UTI.  6. DVT prophylaxis. 7. Keep euvolemic.  Suggest to D/C IVF.  8. F/U  for myositis. (anti Jo, aldolase, ANA). CKs were normal. ESR and CRP very elevated.  9. Try to sit on the chair. PT evaluation.     Monica Becton, MD 07/23/2016, 8:55 AM [place] [facility] Pager [phone] After 3 pm or if no answer, call [phone]

## 2016-07-24 ENCOUNTER — Inpatient Hospital Stay (HOSPITAL_COMMUNITY): Payer: Medicare Other

## 2016-07-24 DIAGNOSIS — K210 Gastro-esophageal reflux disease with esophagitis: Secondary | ICD-10-CM

## 2016-07-24 LAB — CBC
HCT: 39.3 % (ref 36.0–46.0)
HEMOGLOBIN: 13 g/dL (ref 12.0–15.0)
MCH: 28.1 pg (ref 26.0–34.0)
MCHC: 33.1 g/dL (ref 30.0–36.0)
MCV: 84.9 fL (ref 78.0–100.0)
Platelets: 215 10*3/uL (ref 150–400)
RBC: 4.63 MIL/uL (ref 3.87–5.11)
RDW: 15.2 % (ref 11.5–15.5)
WBC: 13.8 10*3/uL — ABNORMAL HIGH (ref 4.0–10.5)

## 2016-07-24 LAB — GLUCOSE, CAPILLARY
GLUCOSE-CAPILLARY: 208 mg/dL — AB (ref 65–99)
GLUCOSE-CAPILLARY: 221 mg/dL — AB (ref 65–99)
GLUCOSE-CAPILLARY: 211 mg/dL — AB (ref 65–99)
Glucose-Capillary: 293 mg/dL — ABNORMAL HIGH (ref 65–99)

## 2016-07-24 LAB — BASIC METABOLIC PANEL
Anion gap: 9 (ref 5–15)
BUN: 9 mg/dL (ref 6–20)
CHLORIDE: 102 mmol/L (ref 101–111)
CO2: 25 mmol/L (ref 22–32)
Calcium: 8.6 mg/dL — ABNORMAL LOW (ref 8.9–10.3)
Creatinine, Ser: 0.49 mg/dL (ref 0.44–1.00)
GFR calc Af Amer: >60 mL/min (ref >60)
GFR calc non Af Amer: >60 mL/min (ref >60)
Glucose, Bld: 230 mg/dL — ABNORMAL HIGH (ref 65–99)
POTASSIUM: 4.2 mmol/L (ref 3.5–5.1)
SODIUM: 136 mmol/L (ref 135–145)

## 2016-07-24 LAB — ALBUMIN: Albumin: 2.4 g/dL — ABNORMAL LOW (ref 3.5–5.0)

## 2016-07-24 LAB — C-REACTIVE PROTEIN: CRP: 7.3 mg/dL — AB (ref <1.0)

## 2016-07-24 LAB — PHOSPHORUS: Phosphorus: 2 mg/dL — ABNORMAL LOW (ref 2.5–4.6)

## 2016-07-24 LAB — MAGNESIUM: Magnesium: 2.1 mg/dL (ref 1.7–2.4)

## 2016-07-24 LAB — SEDIMENTATION RATE: Sed Rate: 55 mm/hr — ABNORMAL HIGH (ref 0–22)

## 2016-07-24 MED ORDER — INSULIN GLARGINE 100 UNIT/ML ~~LOC~~ SOLN
35.0000 [IU] | Freq: Every day | SUBCUTANEOUS | Status: DC
  Administered 2016-07-24 – 2016-07-29 (×6): 35 [IU] via SUBCUTANEOUS
  Filled 2016-07-24 (×7): qty 0.35

## 2016-07-24 MED ORDER — INSULIN GLARGINE 100 UNIT/ML ~~LOC~~ SOLN
15.0000 [IU] | Freq: Once | SUBCUTANEOUS | Status: AC
  Administered 2016-07-24: 15 [IU] via SUBCUTANEOUS
  Filled 2016-07-24: qty 0.15

## 2016-07-24 NOTE — Progress Notes (Signed)
Name: Kristina Schroeder MRN: [id number] DOB: [birth year]    ADMISSION DATE:  07/20/2016 CONSULTATION DATE:  07/20/2016  REFERRING MD :  Dr. Eulas Post  CHIEF COMPLAINT:  SOB, ILD  HISTORY OF PRESENT ILLNESS:   76 yo female never smoker with hx fibromyalgia, chronic LBBB, sinus tachycardia regulated with B blocker and diltiazem, DM, GERD.  She was recently diagnosed with an ILD of unknown etiology and placed on 79m then increased to 630mof prednisone which she has been on for 1 month.  She was seen by her pulmonologist yesterday.  She also has liver lesions concerning for malignancy.   She states her SOB symptoms started in 05/2016 and have been only slightly better on the steroids.  She is on 3L of O2 with rest and exertion.  She reports that her O2 sats drop with exertion into the high 80s but she does not increase her O2.  Today she was walking to the bathroom and felt acutely SOB and diaphoretic.  She denies, chest pain, or vertigo.  She called for her husband and lost consciousness.  Husband denies any seizure like activity, bowel, bladder incontinence and she regained consciousness in seconds.   SUBJECTIVE:  Feels the same. Remains on NRBM. Easily desaturates.  She did NOT tolerate HFNC as he felt more SOB and her o2 sats dropped.  Eating some.   VITAL SIGNS: Temp:  [96 F (35.6 C)-98.3 F (36.8 C)] 96 F (35.6 C) (02/14 0700) Pulse Rate:  [64-97] 68 (02/14 0700) Resp:  [16-31] 21 (02/14 0700) BP: (103-123)/(52-74) 118/65 (02/14 0700) SpO2:  [82 %-98 %] 93 % (02/14 0700) Weight:  [68.1 kg (150 lb 2.1 oz)] 68.1 kg (150 lb 2.1 oz) (02/14 0500)  PHYSICAL EXAMINATION: General:  NAD, well nourished. Comfortable.  Neuro:  CN grossly intact. (-) lateralizing signs.  HEENT:  NCAT, MM dry Cardiovascular:  RRR, s1/s2 no m/r/g.  Trace to 1+ pitting edema b/l LE Lungs:  Crackles mid-bases.  no wheezing/rhonchi.  (-) accessory muscle use.  Abdomen:  Soft, non-tender, normal bowel  sounds Musculoskeletal:  Normal bulk and tone, joints normal Skin:  No rashes   Recent Labs Lab 07/21/16 0423 07/22/16 0324 07/24/16 0248  NA 133* 137 136  K 3.6 3.0* 4.2  CL 99* 104 102  CO2 24 24 25   BUN 5* 6 9  CREATININE 0.55 0.48 0.49  GLUCOSE 177* 121* 230*    Recent Labs Lab 07/21/16 0423 07/22/16 0324 07/24/16 0248  HGB 14.5 13.2 13.0  HCT 43.9 41.2 39.3  WBC 12.1* 9.7 13.8*  PLT 195 135* 215   No results found.  ASSESSMENT / PLAN: Acute on Chronic Hypoxemic Respiratory Failure 2/2 Diffuse Bilateral Infiltrates  S/P Transbronchial Biopsy in 05/31/16 showing Focal Interstitial Fibrosis  CTD work up (-). Lymphocytic predominance in BAL (05/2016)  Infiltrates "waxing and waning" with serial Chest CT scan  Pt with breast CA 15 yrs ago and also with liver lesions now concerning for CA.  Differentials : 1. IPF/NSIP.  CT scan is not typical for IPF but the bases show some subpleural fibrosis.  2. Cryptogenic organizing PNA 3. Eosinophilic PNA 4. Less likely infectious as BAL was (-) in 05/2016 5. She can have a VTE on top of this process as [facility]. Dopplers are (-) for DVT, SVT. This is less likely at this point.   6. Certainly, she can have CAD/CHF exacerbation.  Echo with 45-50% EF and diffuse  hypokinesis back in 05/2016. Rpt Echo in 07/2015 with EF better at 55%. RV fxn mildly decreased. This is less likely with her echo being better this admission.  7. Possible malignancy given lesions in the liver as well. Pt with breast CA, L, treated 2003.   Plan : 1.  I think at the end of the day, this will be " IPF" and she is having "IPF exacerbation".  Her chest ct scan however is NOT typical for IPF.  Her ESR and CRP are elevated.  If she gets worse and gets intubated, she will need a surgical lung biopsy.  2.  She has plateaued as far as improvement is concerned. Cont with pulse steroids.  Plan for 1 g/day Medrol  for 3  days, last day will be today.  I discussed with pt and husband re: 2nd line treatment with MABs or trial with pirfenidone.  They were looking into seeking a 2nd opinion at [facility] before she got admitted.  I will look into that as well.  3. Cont with NRBM, keep o2 sats > 88%.  4. She is OK with short term intubation. I discussed with her potentially poor prognosis with IPF.  They understand but are not ready to limit her code status.  She was very fxnal prior to Dec 2017.  5. Keep Rocephin for UTI, plan for 3days.  6. DVT prophylaxis. 7. Keep euvolemic.  Suggest to D/C IVF.  8. F/U  for myositis. (anti Jo, aldolase, ANA). CKs were normal. ESR and CRP very elevated.  9. Try to sit on the chair. PT evaluation.  10. CXR today. Check for fluid overload or pulm edema.     I spent  30  minutes of Critical Care time with this patient today.    Monica Becton, MD 07/24/2016, 9:42 AM [place] [facility] Pager [phone] After 3 pm or if no answer, call [phone]

## 2016-07-24 NOTE — Progress Notes (Signed)
Inpatient Diabetes Program Recommendations  AACE/ADA: New Consensus Statement on Inpatient Glycemic Control (2015)  Target Ranges:  Prepandial:   less than 140 mg/dL      Peak postprandial:   less than 180 mg/dL (1-2 hours)      Critically ill patients:  140 - 180 mg/dL   Lab Results  Component Value Date   GLUCAP 208 (H) 07/24/2016   HGBA1C 11.3 (H) 07/22/2016    Inpatient Diabetes Program Recommendations:  A1C result of 11.3% (an average CBG of 320 mg/dL) indicates poor glucose control over the past 2 to 3 months.  Spoke at bedside with patient regarding self-management of diabetes.  1.  Taught patient the following (teach back and/or return demonstration):  Hypo/Hyperglycemia  Medications at D/C (what these are, why taking, when taking, how taking, common S.E.'s)  CBG monitoring, A1C (what these are and why these are important)   2.  Identified barriers and facilitators to self-management goals:  Patient has a very good knowledge of DM and was able to teach back all information above.  Patient experienced difficulty with CBG control when she needed to take steroids at home.  This RN explained how steroids affect CBG's, which patient understood.  Patient's comorbidities are her biggest barrier to controlling DM.  3.  Identified support systems:  Family (husband, daughter, Yolanda Bonine, 1st cousin), friends/faith community (patient has a strong faith in God and voiced how much this helps her).  Thank you,  Windy Carina, RN, MSN Diabetes Coordinator Inpatient Diabetes Program [phone] (Team Pager)

## 2016-07-24 NOTE — Progress Notes (Signed)
Progress Note    Kristina Schroeder  GXQ:[id number] DOB: 01/21/1941  DOA: 07/20/2016 PCP: Cari Caraway, MD    Brief Narrative:   Chief complaint: Follow-up syncope  Kristina Schroeder is an 76 y.o. female with a PMH of interstitial lung disease, pulmonary fibrosis, chronic hypoxic respiratory failure on 3 L of home oxygen, diabetes, breast cancer, chronic left bundle branch block, dyslipidemia and fibromyalgia who was admitted 07/20/16 for evaluation of 2 syncopal events. She was noted to be orthostatic upon evaluation by EMS and received 1 L of normal saline. She also had an increased oxygen requirement requiring a nonrebreather mask to maintain her oxygen saturations. She saw her pulmonologist 07/19/16 and had a high resolution CT scan to 8/18 which showed patchy irregular areas of consolidation and groundglass opacities throughout the lungs, some of which are improved and some worsened compared to previous CT scan. Underlying pulmonary fibrosis and multiple attenuation lesions in the liver again identified. Recommendations were to start a slow prednisone taper.  Assessment/Plan:   Principal Problems:   Syncope and collapse/Orthostatic hypotension Noted to be orthostatic on presentation. Placed on IV hydration. Worsening hypoxia may be contributory Currently hemodynamically stable. 2-D echo shows EF of 55-60%, moderate pulmonary hypertension .    Acute on chronic respiratory failure in the setting of interstitial lung disease and pulmonary fibrosis/moderate pulmonary hypertension Chest x-ray done 07/20/16 is as pictured below, patchy bilateral infiltrates apparent.  F/U myositis panel.  Respiratory virus panel negative. Pulmonology following and has changed her prednisone to Solu-Medrol 1 g IV daily with plans to continue this for 3 days (on day #2). Dopplers negative for DVT. 2-D echo showed moderate pulmonary hypertension, EF 55-60%. Remains on high flow oxygen.   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  F/U  for myositis. (anti Jo, aldolase, ANA all negative ). CKs were normal. ESR and CRP very elevated       Left bundle branch hemiblock Monitoring on telemetry.    GERD (gastroesophageal reflux disease) Continue Protonix.    Type 2 diabetes mellitus uncontrolled, with long-term current use of insulin (HCC) and stage III CKD Currently being managed with Lantus 20  units daily, and insulin resistant sliding scale. CBGs 115-318. Will increase meal coverage to 11 units and increase Lantus to 35 units given high-dose steroids. May need to be more aggressive with insulin therapy given high-dose steroids. Hemoglobin A1c 11.3% indicating poor control. Diabetes coordinator following. 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    Hyperlipidemia Continue Crestor and Zetia.    Hypokalemia Secondary to diuretic therapy. Repleted.    UTI Urine culture growing gram-negative rods. Empiric ceftriaxone continue   Family Communication/Anticipated D/C date and plan/Code Status   DVT prophylaxis: Lovenox ordered. Code Status: Full Code.  Family Communication: Husband updated at the bedside. Disposition Plan: Home when stable, likely several more days.   Medical Consultants:    Pulmonology   Procedures:    2 D Echo 07/22/16  Study Conclusions  - Left ventricle: The cavity size was normal. Systolic function was   normal. The estimated ejection fraction was in the range of 55%   to 60%. Images were inadequate for LV wall motion assessment. The   study is not technically sufficient to allow evaluation of LV   diastolic function. - Mitral valve: There was mild regurgitation. - Right ventricle: The cavity size was mildly dilated.  Wall   thickness was normal. Systolic function was mildly reduced. - Tricuspid valve: There was mild regurgitation. - Pulmonary arteries:  PA peak pressure: 60 mm Hg (S).  Impressions:  - The right ventricular systolic pressure was increased consistent   with moderate pulmonary hypertension.   Bilateral lower extremity venous duplex   Preliminary report:  There is no DVT or SVT noted in the bilateral lower extremities.   Anti-Infectives:    Rocephin 07/22/16--->  Subjective:    she continues to be short of breath. Denies cough or chest pain. No further syncopal episodes.  Objective:    Vitals:   07/24/16 0400 07/24/16 0500 07/24/16 0600 07/24/16 0700  BP: 110/74 119/61 118/66 118/65  Pulse: 69 66 77 68  Resp: 18 (!) 21 (!) 31 (!) 21  Temp: 97.6 F (36.4 C)     TempSrc: Oral     SpO2: 94% (!) 85% (!) 87% 93%  Weight:  68.1 kg (150 lb 2.1 oz)    Height:        Intake/Output Summary (Last 24 hours) at 07/24/16 0750 Last data filed at 07/24/16 0700  Gross per 24 hour  Intake             1341 ml  Output             1700 ml  Net             -359 ml   Filed Weights   07/22/16 0500 07/23/16 0500 07/24/16 0500  Weight: 69.6 kg (153 lb 7 oz) 68.2 kg (150 lb 5.7 oz) 68.1 kg (150 lb 2.1 oz)    Exam: General exam: Increased work of breathing, but otherwise comfortable. Non-rebreather mask on. Respiratory system: Relatively clear today. Labored breathing with tachypnea. Cardiovascular system: Tachycardic rate, regular rhythm. No JVD,  rubs, gallops or clicks. No murmurs. Gastrointestinal system: Abdomen is nondistended, soft and nontender. No organomegaly or masses felt. Normal bowel sounds heard. Central nervous system: Alert and oriented. No focal neurological deficits. Extremities: No clubbing, or cyanosis. No edema. Skin: No rashes, lesions or ulcers. Psychiatry: Judgement and insight appear normal. Mood & affect appropriate.   Data Reviewed:   I have personally reviewed following labs and imaging studies:  Labs: Basic Metabolic Panel:  Recent Labs Lab 07/20/16 1856 07/21/16 0423  07/22/16 0324 07/24/16 0248  NA 133* 133* 137 136  K 3.5 3.6 3.0* 4.2  CL 98* 99* 104 102  CO2 26 24 24 25   GLUCOSE 257* 177* 121* 230*  BUN 8 5* 6 9  CREATININE 0.64 0.55 0.48 0.49  CALCIUM 8.0* 8.1* 7.9* 8.6*  MG  --   --   --  2.1  PHOS  --   --   --  2.0*   GFR Estimated Creatinine Clearance: 55.6 mL/min (by C-G formula based on SCr of 0.49 mg/dL). Liver Function Tests:  Recent Labs Lab 07/20/16 1856 07/24/16 0248  AST 23  --   ALT 23  --   ALKPHOS 82  --   BILITOT 0.8  --   PROT 5.5*  --   ALBUMIN 2.7* 2.4*   CBC:  Recent Labs Lab 07/20/16 1856 07/21/16 0423 07/22/16 0324 07/24/16 0248  WBC 12.0* 12.1* 9.7 13.8*  NEUTROABS 10.1*  --   --   --   HGB 14.0 14.5 13.2 13.0  HCT 42.2 43.9 41.2 39.3  MCV 85.4 85.6 86.0 84.9  PLT 196 195 135* 215   CBG:  Recent Labs Lab  07/22/16 2125 07/23/16 0758 07/23/16 1213 07/23/16 1613 07/23/16 2150  GLUCAP 318* 198* 261* 293* 347*   Sepsis Labs:  Recent Labs Lab 07/20/16 1856 07/20/16 1911 07/20/16 2138 07/21/16 0423 07/22/16 0324 07/24/16 0248  PROCALCITON  --   --  0.10  --   --   --   WBC 12.0*  --   --  12.1* 9.7 13.8*  LATICACIDVEN  --  1.60  --   --   --   --     Microbiology Recent Results (from the past 240 hour(s))  Respiratory Panel by PCR     Status: None   Collection Time: 07/20/16  6:56 PM  Result Value Ref Range Status   Adenovirus NOT DETECTED NOT DETECTED Final   Coronavirus 229E NOT DETECTED NOT DETECTED Final   Coronavirus HKU1 NOT DETECTED NOT DETECTED Final   Coronavirus NL63 NOT DETECTED NOT DETECTED Final   Coronavirus OC43 NOT DETECTED NOT DETECTED Final   Metapneumovirus NOT DETECTED NOT DETECTED Final   Rhinovirus / Enterovirus NOT DETECTED NOT DETECTED Final   Influenza A NOT DETECTED NOT DETECTED Final   Influenza B NOT DETECTED NOT DETECTED Final   Parainfluenza Virus 1 NOT DETECTED NOT DETECTED Final   Parainfluenza Virus 2 NOT DETECTED NOT DETECTED Final    Parainfluenza Virus 3 NOT DETECTED NOT DETECTED Final   Parainfluenza Virus 4 NOT DETECTED NOT DETECTED Final   Respiratory Syncytial Virus NOT DETECTED NOT DETECTED Final   Bordetella pertussis NOT DETECTED NOT DETECTED Final   Chlamydophila pneumoniae NOT DETECTED NOT DETECTED Final   Mycoplasma pneumoniae NOT DETECTED NOT DETECTED Final  Culture, Urine     Status: Abnormal   Collection Time: 07/20/16  7:03 PM  Result Value Ref Range Status   Specimen Description URINE, RANDOM  Final   Special Requests ADDED AT 0256 ON [id number]  Final   Culture >=100,000 COLONIES/mL ESCHERICHIA COLI (A)  Final   Report Status 07/23/2016 FINAL  Final   Organism ID, Bacteria ESCHERICHIA COLI (A)  Final      Susceptibility   Escherichia coli - MIC*    AMPICILLIN >=32 RESISTANT Resistant     CEFAZOLIN 32 INTERMEDIATE Intermediate     CEFTRIAXONE <=1 SENSITIVE Sensitive     CIPROFLOXACIN <=0.25 SENSITIVE Sensitive     GENTAMICIN <=1 SENSITIVE Sensitive     IMIPENEM <=0.25 SENSITIVE Sensitive     NITROFURANTOIN <=16 SENSITIVE Sensitive     TRIMETH/SULFA <=20 SENSITIVE Sensitive     AMPICILLIN/SULBACTAM >=32 RESISTANT Resistant     PIP/TAZO <=4 SENSITIVE Sensitive     Extended ESBL NEGATIVE Sensitive     * >=100,000 COLONIES/mL ESCHERICHIA COLI  MRSA PCR Screening     Status: None   Collection Time: 07/21/16  8:52 AM  Result Value Ref Range Status   MRSA by PCR NEGATIVE NEGATIVE Final    Comment:        The GeneXpert MRSA Assay (FDA approved for NASAL specimens only), is one component of a comprehensive MRSA colonization surveillance program. It is not intended to diagnose MRSA infection nor to guide or monitor treatment for MRSA infections.     Radiology: No results found.  Medications:   . aspirin EC  81 mg Oral QODAY  . cefTRIAXone (ROCEPHIN)  IV  1 g Intravenous Q24H  . diltiazem  360 mg Oral Daily  . DULoxetine  60 mg Oral BID  . enoxaparin (LOVENOX) injection  40 mg Subcutaneous  Q24H  . ezetimibe  10  mg Oral QHS  . guaiFENesin  600 mg Oral BID  . insulin aspart  0-20 Units Subcutaneous TID WC  . insulin aspart  0-5 Units Subcutaneous QHS  . insulin aspart  8 Units Subcutaneous TID WC  . insulin glargine  20 Units Subcutaneous QHS  . loratadine  10 mg Oral Daily  . mouth rinse  15 mL Mouth Rinse BID  . methylPREDNISolone (SOLU-MEDROL) injection  250 mg Intravenous Q6H  . nadolol  20 mg Oral Daily  . pantoprazole  40 mg Oral BID AC  . pneumococcal 23 valent vaccine  0.5 mL Intramuscular Tomorrow-1000  . psyllium  1 packet Oral BID  . rosuvastatin  5 mg Oral QHS  . sodium chloride flush  3 mL Intravenous Q12H   Continuous Infusions: . sodium chloride 10 mL/hr at 07/24/16 0700    Medical decision making is of high complexity and this patient is at high risk of deterioration, therefore this is a level 3 visit.  (> 4 problem points, >4 data points, high risk)   LOS: 3 days   [facility] Pager [phone]. If unable to reach me by pager, please call my cell phone at [phone].  *Please refer to [email or web address], password [email or web address] to get updated schedule on who will round on this patient, as hospitalists switch teams weekly. If 7PM-7AM, please contact night-coverage at [email or web address], password [email or web address] for any overnight needs.  07/24/2016, 7:50 AM

## 2016-07-24 NOTE — Progress Notes (Signed)
   [facility] PCCM  Family requested a 2nd opinion c/o [facility] or [facility].  I called [facility] and their ICU is full.  I spoke with Intensivist from [facility] Dr. Ernst Breach and we extensively discussed the case. She agrees with current management.   I discussed with pt and her daughter re over all prognosis.  I think at the end of the day, she has a severe exacerbation of IPF.  Her CT scan is not typical of IPF but almost all serologies are (-) and cultures are are.   RF, hypersensitivity panel, ANA, AntiScl, Anti DS DNA, GBM Ab, ACE, ANCA, Anti Jo were Normal. C4 was N.  C3 was elevated back in 05/2016.  CRP was elevated at 1.9 back in 05/2016.  ESR was 24 back in 05/2016, it was 62 this admission.  Aldolase was mildly elevated.   Pt does NOT want a tracheostomy or a PEG or be in a SNF.  She decided to be a DNR.  Daughter is aware.   Plan : 1. To be academic, will rpt ESR and CRP today to see if it decreases with pulse steroids. Will also check Anti Sjogrens Ab, anti histone Ab, anti RNP Ab.  2.  If ESR and CRP are not significantly decreased, that will be unfavorable for the pt.  3. Keep on NRBM.  Will see how she does over the week. Keep o2 sats > 88%.  Reassess whether she can be tried on pendant oximizer next week.  She ultimately wants to go home.  Let us plan on  going home next week if she is still here.  Will likely need hospice then.  She and daughter understand that she may die at home but she would rather die at home than in the hospital.  4. Keep euvolemic.  5. Cont rocephin for UTI.   Monica Becton, MD 07/24/2016, 1:56 PM [facility] Pager [phone] After 3 pm or if no answer, call [phone]

## 2016-07-25 LAB — CBC
HCT: 39.5 % (ref 36.0–46.0)
Hemoglobin: 12.8 g/dL (ref 12.0–15.0)
MCH: 27.6 pg (ref 26.0–34.0)
MCHC: 32.4 g/dL (ref 30.0–36.0)
MCV: 85.3 fL (ref 78.0–100.0)
PLATELETS: 232 10*3/uL (ref 150–400)
RBC: 4.63 MIL/uL (ref 3.87–5.11)
RDW: 15.3 % (ref 11.5–15.5)
WBC: 11.7 10*3/uL — ABNORMAL HIGH (ref 4.0–10.5)

## 2016-07-25 LAB — COMPREHENSIVE METABOLIC PANEL
ALBUMIN: 2.5 g/dL — AB (ref 3.5–5.0)
ALT: 19 U/L (ref 14–54)
ANION GAP: 10 (ref 5–15)
AST: 19 U/L (ref 15–41)
Alkaline Phosphatase: 104 U/L (ref 38–126)
BUN: 10 mg/dL (ref 6–20)
CHLORIDE: 102 mmol/L (ref 101–111)
CO2: 27 mmol/L (ref 22–32)
Calcium: 8.4 mg/dL — ABNORMAL LOW (ref 8.9–10.3)
Creatinine, Ser: 0.45 mg/dL (ref 0.44–1.00)
GFR calc non Af Amer: >60 mL/min (ref >60)
Glucose, Bld: 163 mg/dL — ABNORMAL HIGH (ref 65–99)
POTASSIUM: 3.6 mmol/L (ref 3.5–5.1)
SODIUM: 139 mmol/L (ref 135–145)
Total Bilirubin: 0.6 mg/dL (ref 0.3–1.2)
Total Protein: 5.6 g/dL — ABNORMAL LOW (ref 6.5–8.1)

## 2016-07-25 LAB — HISTONE ANTIBODIES, IGG, BLOOD: DNA-HISTONE: 0.2 U (ref 0.0–0.9)

## 2016-07-25 LAB — GLUCOSE, CAPILLARY
GLUCOSE-CAPILLARY: 219 mg/dL — AB (ref 65–99)
GLUCOSE-CAPILLARY: 211 mg/dL — AB (ref 65–99)
GLUCOSE-CAPILLARY: 280 mg/dL — AB (ref 65–99)
Glucose-Capillary: 171 mg/dL — ABNORMAL HIGH (ref 65–99)

## 2016-07-25 LAB — SJOGRENS SYNDROME-B EXTRACTABLE NUCLEAR ANTIBODY

## 2016-07-25 LAB — SJOGRENS SYNDROME-A EXTRACTABLE NUCLEAR ANTIBODY: SSA (Ro) (ENA) Antibody, IgG: <0.2 AI (ref 0.0–0.9)

## 2016-07-25 LAB — RNP ANTIBODIES: Ribonucleic Protein: <0.2 AI (ref 0.0–0.9)

## 2016-07-25 MED ORDER — METHYLPREDNISOLONE SODIUM SUCC 125 MG IJ SOLR: 125.0000 mg | Freq: Four times a day (QID) | INTRAMUSCULAR | Status: DC

## 2016-07-25 MED ORDER — METHYLPREDNISOLONE SODIUM SUCC 125 MG IJ SOLR
125.0000 mg | Freq: Four times a day (QID) | INTRAMUSCULAR | Status: AC
  Administered 2016-07-25 – 2016-07-26 (×4): 125 mg via INTRAVENOUS
  Filled 2016-07-25 (×4): qty 2

## 2016-07-25 MED ORDER — METHYLPREDNISOLONE SODIUM SUCC 125 MG IJ SOLR
60.0000 mg | Freq: Four times a day (QID) | INTRAMUSCULAR | Status: AC
  Administered 2016-07-26 – 2016-07-27 (×4): 60 mg via INTRAVENOUS
  Filled 2016-07-25 (×4): qty 2

## 2016-07-25 MED ORDER — METHYLPREDNISOLONE SODIUM SUCC 40 MG IJ SOLR
30.0000 mg | Freq: Two times a day (BID) | INTRAMUSCULAR | Status: AC
  Administered 2016-07-28 (×2): 30 mg via INTRAVENOUS
  Filled 2016-07-25 (×2): qty 1

## 2016-07-25 MED ORDER — METHYLPREDNISOLONE SODIUM SUCC 40 MG IJ SOLR
30.0000 mg | Freq: Four times a day (QID) | INTRAMUSCULAR | Status: AC
  Administered 2016-07-27 – 2016-07-28 (×4): 30 mg via INTRAVENOUS
  Filled 2016-07-25 (×4): qty 1

## 2016-07-25 MED ORDER — FUROSEMIDE 10 MG/ML IJ SOLN
40.0000 mg | Freq: Once | INTRAMUSCULAR | Status: AC
  Administered 2016-07-25: 40 mg via INTRAVENOUS
  Filled 2016-07-25: qty 4

## 2016-07-25 MED ORDER — PREDNISONE 20 MG PO TABS
40.0000 mg | ORAL_TABLET | Freq: Every day | ORAL | Status: DC
  Administered 2016-07-29 – 2016-07-30 (×2): 40 mg via ORAL
  Filled 2016-07-25 (×3): qty 2

## 2016-07-25 NOTE — Care Management Important Message (Signed)
Important Message  Patient Details  Name: Kristina Schroeder MRN: [id number] Date of Birth: [birth year]   Medicare Important Message Given:  Yes    Leilynn Pilat Abena 07/25/2016, 11:59 AM

## 2016-07-25 NOTE — Progress Notes (Signed)
Progress Note    Kristina Schroeder  AGT:[id number] DOB: 1941/02/13  DOA: 07/20/2016 PCP: Cari Caraway, MD    Brief Narrative:   Chief complaint: Follow-up syncope  Kristina Schroeder is an 76 y.o. female with a PMH of interstitial lung disease, pulmonary fibrosis, chronic hypoxic respiratory failure on 3 L of home oxygen, diabetes, breast cancer, chronic left bundle branch block, dyslipidemia and fibromyalgia who was admitted 07/20/16 for evaluation of 2 syncopal events. She was noted to be orthostatic upon evaluation by EMS and received 1 L of normal saline. She also had an increased oxygen requirement requiring a nonrebreather mask to maintain her oxygen saturations. She saw her pulmonologist 07/19/16 and had a high resolution CT scan 07/18/16 which showed patchy irregular areas of consolidation and groundglass opacities throughout the lungs, some of which are improved and some worsened compared to previous CT scan. Underlying pulmonary fibrosis and multiple attenuation lesions in the liver again identified. Recommendations were to start a slow prednisone taper.  Assessment/Plan:   Principal Problems:   Syncope and collapse/Orthostatic hypotension Noted to be orthostatic on presentation. Placed on IV hydration. Worsening hypoxia may be contributory Currently hemodynamically stable. 2-D echo shows EF of 55-60%, moderate pulmonary hypertension .    Acute on chronic respiratory failure in the setting of interstitial lung disease and pulmonary fibrosis/moderate pulmonary hypertension S/P Transbronchial Biopsy in 05/31/16 showing Focal Interstitial Fibrosis CTD work up (-) except for elevated C4, mildly elevated aldolase. Lymphocytic predominance in BAL (05/2016) Infiltrates "waxing and waning" with serial Chest CT scan) Chest x-ray done 07/20/16  patchy bilateral infiltrates apparent.   Respiratory virus panel negative.  S/P Pulse dose medrol, 3 days finished on 2/15 with 1 g/day.  Plan to decrease  dose to 125 mg q6 (from 250 mgq6) Medrol for 24 hrs then decrease to 60 mg q 6 x 24 then 30 mg q 6 x 24 hrs then 30 mg q12 x 24hrs then switch to 40 mg of prednisone. Dopplers negative for DVT. 2-D echo showed moderate pulmonary hypertension, EF 55-60%. Remains on high flow oxygen.   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 F/U  for myositis. (anti Jo negative, aldolase slightly elevated, ANA   negative ). CKs were normal. ESR and CRP very elevated Cont with NRBM, keep o2 sats > 88%. Trial with HFNC or pendant oximizer Give 1 dose of IV Lasix 1, to help in weaning     Left bundle branch hemiblock Monitoring on telemetry.   GERD (gastroesophageal reflux disease) Continue Protonix.   Type 2 diabetes mellitus uncontrolled, with long-term current use of insulin (HCC) and stage III CKD Currently being managed with Lantus  And resistant sliding scale. CBGs 115-318. Continue meal coverage to 11 units and  Lantus to 35 units given high-dose steroids. May need to be more aggressive with insulin therapy given high-dose steroids. Hemoglobin A1c 11.3% indicating poor control. Diabetes coordinator following. 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    Hyperlipidemia Continue Crestor and Zetia.    Hypokalemia Secondary to diuretic therapy. Repleted.    UTI Urine culture growing gram-negative rods. Empiric ceftriaxone continue   Family Communication/Anticipated D/C date and plan/Code Status   DVT prophylaxis: Lovenox ordered. Code Status: Full Code.  Family Communication: Husband updated at the bedside. Disposition Plan: Home when stable From a pulmonary standpoint, likely several more days.   Medical Consultants:  Pulmonology   Procedures:    2 D Echo 07/22/16  Study Conclusions  - Left ventricle: The cavity size was normal. Systolic function was   normal. The  estimated ejection fraction was in the range of 55%   to 60%. Images were inadequate for LV wall motion assessment. The   study is not technically sufficient to allow evaluation of LV   diastolic function. - Mitral valve: There was mild regurgitation. - Right ventricle: The cavity size was mildly dilated. Wall   thickness was normal. Systolic function was mildly reduced. - Tricuspid valve: There was mild regurgitation. - Pulmonary arteries: PA peak pressure: 60 mm Hg (S).  Impressions:  - The right ventricular systolic pressure was increased consistent   with moderate pulmonary hypertension.   Bilateral lower extremity venous duplex   Preliminary report:  There is no DVT or SVT noted in the bilateral lower extremities.   Anti-Infectives:    Rocephin 07/22/16--->  Subjective:    she continues to be short of breath. Denies cough or chest pain. No further syncopal episodes.  Objective:    Vitals:   07/25/16 0533 07/25/16 0630 07/25/16 0640 07/25/16 0650  BP: 106/71 129/72    Pulse: 79 74 77 69  Resp: (!) 22 18 20 19   Temp: 97.8 F (36.6 C)     TempSrc: Oral     SpO2: 95% 93% 93% 95%  Weight: 67.7 kg (149 lb 4.8 oz)     Height:        Intake/Output Summary (Last 24 hours) at 07/25/16 0805 Last data filed at 07/24/16 2130  Gross per 24 hour  Intake              212 ml  Output             1400 ml  Net            -1188 ml   Filed Weights   07/24/16 0500 07/25/16 0454 07/25/16 0533  Weight: 68.1 kg (150 lb 2.1 oz) 68.1 kg (150 lb 2.1 oz) 67.7 kg (149 lb 4.8 oz)    Exam: General:  NAD, well nourished. Comfortable.  Neuro:  CN grossly intact. (-) lateralizing signs.  HEENT:  NCAT, MM dry Cardiovascular:  RRR, s1/s2 no m/r/g.  Trace to 1+ pitting edema b/l LE Lungs:  Crackles mid-bases.  no wheezing/rhonchi.  (-) accessory muscle use.  Abdomen:  Soft, non-tender, normal bowel sounds Musculoskeletal:  Normal bulk and tone, joints normal Skin:  No rashes  Data  Reviewed:   I have personally reviewed following labs and imaging studies:  Labs: Basic Metabolic Panel:  Recent Labs Lab 07/20/16 1856 07/21/16 0423 07/22/16 0324 07/24/16 0248 07/25/16 0400  NA 133* 133* 137 136 139  K 3.5 3.6 3.0* 4.2 3.6  CL 98* 99* 104 102 102  CO2 26 24 24 25 27   GLUCOSE 257* 177* 121* 230* 163*  BUN 8 5* 6 9 10   CREATININE 0.64 0.55 0.48 0.49 0.45  CALCIUM 8.0* 8.1* 7.9* 8.6* 8.4*  MG  --   --   --  2.1  --   PHOS  --   --   --  2.0*  --    GFR Estimated Creatinine Clearance: 55.5 mL/min (by C-G formula based on SCr of 0.45 mg/dL). Liver Function Tests:  Recent Labs Lab 07/20/16 1856 07/24/16 0248 07/25/16 0400  AST 23  --  19  ALT 23  --  19  ALKPHOS 82  --  104  BILITOT 0.8  --  0.6  PROT 5.5*  --  5.6*  ALBUMIN 2.7* 2.4* 2.5*   CBC:  Recent Labs Lab 07/20/16 1856 07/21/16 0423 07/22/16 0324 07/24/16 0248 07/25/16 0400  WBC 12.0* 12.1* 9.7 13.8* 11.7*  NEUTROABS 10.1*  --   --   --   --   HGB 14.0 14.5 13.2 13.0 12.8  HCT 42.2 43.9 41.2 39.3 39.5  MCV 85.4 85.6 86.0 84.9 85.3  PLT 196 195 135* 215 232   CBG:  Recent Labs Lab 07/24/16 0801 07/24/16 1131 07/24/16 1603 07/24/16 2129 07/25/16 0740  GLUCAP 208* 293* 221* 211* 171*   Sepsis Labs:  Recent Labs Lab 07/20/16 1911 07/20/16 2138 07/21/16 0423 07/22/16 0324 07/24/16 0248 07/25/16 0400  PROCALCITON  --  0.10  --   --   --   --   WBC  --   --  12.1* 9.7 13.8* 11.7*  LATICACIDVEN 1.60  --   --   --   --   --     Microbiology Recent Results (from the past 240 hour(s))  Respiratory Panel by PCR     Status: None   Collection Time: 07/20/16  6:56 PM  Result Value Ref Range Status   Adenovirus NOT DETECTED NOT DETECTED Final   Coronavirus 229E NOT DETECTED NOT DETECTED Final   Coronavirus HKU1 NOT DETECTED NOT DETECTED Final   Coronavirus NL63 NOT DETECTED NOT DETECTED Final   Coronavirus OC43 NOT DETECTED NOT DETECTED Final   Metapneumovirus NOT  DETECTED NOT DETECTED Final   Rhinovirus / Enterovirus NOT DETECTED NOT DETECTED Final   Influenza A NOT DETECTED NOT DETECTED Final   Influenza B NOT DETECTED NOT DETECTED Final   Parainfluenza Virus 1 NOT DETECTED NOT DETECTED Final   Parainfluenza Virus 2 NOT DETECTED NOT DETECTED Final   Parainfluenza Virus 3 NOT DETECTED NOT DETECTED Final   Parainfluenza Virus 4 NOT DETECTED NOT DETECTED Final   Respiratory Syncytial Virus NOT DETECTED NOT DETECTED Final   Bordetella pertussis NOT DETECTED NOT DETECTED Final   Chlamydophila pneumoniae NOT DETECTED NOT DETECTED Final   Mycoplasma pneumoniae NOT DETECTED NOT DETECTED Final  Culture, Urine     Status: Abnormal   Collection Time: 07/20/16  7:03 PM  Result Value Ref Range Status   Specimen Description URINE, RANDOM  Final   Special Requests ADDED AT 0256 ON 161096  Final   Culture >=100,000 COLONIES/mL ESCHERICHIA COLI (A)  Final   Report Status 07/23/2016 FINAL  Final   Organism ID, Bacteria ESCHERICHIA COLI (A)  Final      Susceptibility   Escherichia coli - MIC*    AMPICILLIN >=32 RESISTANT Resistant     CEFAZOLIN 32 INTERMEDIATE Intermediate     CEFTRIAXONE <=1 SENSITIVE Sensitive     CIPROFLOXACIN <=0.25 SENSITIVE Sensitive     GENTAMICIN <=1 SENSITIVE Sensitive     IMIPENEM <=0.25 SENSITIVE Sensitive     NITROFURANTOIN <=16 SENSITIVE Sensitive     TRIMETH/SULFA <=20 SENSITIVE Sensitive     AMPICILLIN/SULBACTAM >=32 RESISTANT Resistant     PIP/TAZO <=4 SENSITIVE Sensitive     Extended ESBL NEGATIVE Sensitive     * >=100,000 COLONIES/mL ESCHERICHIA COLI  MRSA PCR Screening     Status: None   Collection Time: 07/21/16  8:52 AM  Result Value Ref Range Status   MRSA by PCR NEGATIVE NEGATIVE Final    Comment:        The GeneXpert MRSA Assay (FDA approved  for NASAL specimens only), is one component of a comprehensive MRSA colonization surveillance program. It is not intended to diagnose MRSA infection nor to guide  or monitor treatment for MRSA infections.     Radiology: Dg Chest Port 1 View  Result Date: 07/24/2016 CLINICAL DATA:  Respiratory failure, shortness of Breath EXAM: PORTABLE CHEST 1 VIEW COMPARISON:  07/20/2016 FINDINGS: Severe diffuse bilateral airspace disease, most confluent in the right upper lobe and left lower lobe. Findings are stable since prior study. No visible effusions. No acute bony abnormality. IMPRESSION: Severe diffuse bilateral airspace disease, unchanged. Electronically Signed   By: Rolm Baptise M.D.   On: 07/24/2016 09:43    Medications:   . aspirin EC  81 mg Oral QODAY  . cefTRIAXone (ROCEPHIN)  IV  1 g Intravenous Q24H  . diltiazem  360 mg Oral Daily  . DULoxetine  60 mg Oral BID  . enoxaparin (LOVENOX) injection  40 mg Subcutaneous Q24H  . ezetimibe  10 mg Oral QHS  . guaiFENesin  600 mg Oral BID  . insulin aspart  0-20 Units Subcutaneous TID WC  . insulin aspart  0-5 Units Subcutaneous QHS  . insulin aspart  8 Units Subcutaneous TID WC  . insulin glargine  35 Units Subcutaneous QHS  . loratadine  10 mg Oral Daily  . mouth rinse  15 mL Mouth Rinse BID  . methylPREDNISolone (SOLU-MEDROL) injection  125 mg Intravenous Q6H   Followed by  . [START ON 07/26/2016] methylPREDNISolone (SOLU-MEDROL) injection  60 mg Intravenous Q6H   Followed by  . [START ON 07/27/2016] methylPREDNISolone (SOLU-MEDROL) injection  30 mg Intravenous Q6H   Followed by  . [START ON 07/28/2016] methylPREDNISolone (SOLU-MEDROL) injection  30 mg Intravenous Q12H   Followed by  . [START ON 07/29/2016] predniSONE  40 mg Oral Q breakfast  . nadolol  20 mg Oral Daily  . pantoprazole  40 mg Oral BID AC  . pneumococcal 23 valent vaccine  0.5 mL Intramuscular Tomorrow-1000  . psyllium  1 packet Oral BID  . rosuvastatin  5 mg Oral QHS  . sodium chloride flush  3 mL Intravenous Q12H   Continuous Infusions: . sodium chloride 10 mL/hr at 07/24/16 0700    Medical decision making is of high  complexity and this patient is at high risk of deterioration, therefore this is a level 3 visit.  (> 4 problem points, >4 data points, high risk)   LOS: 4 days   [facility] Pager [phone]. If unable to reach me by pager, please call my cell phone at [phone].  *Please refer to [email or web address], password [email or web address] to get updated schedule on who will round on this patient, as hospitalists switch teams weekly. If 7PM-7AM, please contact night-coverage at [email or web address], password [email or web address] for any overnight needs.  07/25/2016, 8:05 AM

## 2016-07-25 NOTE — Progress Notes (Signed)
Name: Kristina Schroeder MRN: [id number] DOB: [birth year]    ADMISSION DATE:  07/20/2016 CONSULTATION DATE:  07/20/2016  REFERRING MD :  Dr. Eulas Post  CHIEF COMPLAINT:  SOB, ILD  HISTORY OF PRESENT ILLNESS:   76 yo female never smoker with hx fibromyalgia, chronic LBBB, sinus tachycardia regulated with B blocker and diltiazem, DM, GERD.  She was recently diagnosed with an ILD of unknown etiology and placed on 31m then increased to 629mof prednisone which she has been on for 1 month.  She was seen by her pulmonologist yesterday.  She also has liver lesions concerning for malignancy.   She states her SOB symptoms started in 05/2016 and have been only slightly better on the steroids.  She is on 3L of O2 with rest and exertion.  She reports that her O2 sats drop with exertion into the high 80s but she does not increase her O2.  Today she was walking to the bathroom and felt acutely SOB and diaphoretic.  She denies, chest pain, or vertigo.  She called for her husband and lost consciousness.  Husband denies any seizure like activity, bowel, bladder incontinence and she regained consciousness in seconds.   SUBJECTIVE:  Feels some better/ Remains on NRBM. Easily desaturates.  Eating.  O2 sats 96% from 90s on 2/14.   VITAL SIGNS: Temp:  [97.8 F (36.6 C)-98.6 F (37 C)] 97.8 F (36.6 C) (02/15 0533) Pulse Rate:  [68-91] 69 (02/15 0650) Resp:  [18-30] 19 (02/15 0650) BP: (104-132)/(57-104) 129/72 (02/15 0630) SpO2:  [83 %-98 %] 95 % (02/15 0650) Weight:  [67.7 kg (149 lb 4.8 oz)-68.1 kg (150 lb 2.1 oz)] 67.7 kg (149 lb 4.8 oz) (02/15 0533)  PHYSICAL EXAMINATION: General:  NAD, well nourished. Comfortable.  Neuro:  CN grossly intact. (-) lateralizing signs.  HEENT:  NCAT, MM dry Cardiovascular:  RRR, s1/s2 no m/r/g.  Trace to 1+ pitting edema b/l LE Lungs:  Crackles mid-bases.  no wheezing/rhonchi.  (-) accessory muscle use.  Abdomen:  Soft, non-tender, normal bowel sounds Musculoskeletal:   Normal bulk and tone, joints normal Skin:  No rashes   Recent Labs Lab 07/22/16 0324 07/24/16 0248 07/25/16 0400  NA 137 136 139  K 3.0* 4.2 3.6  CL 104 102 102  CO2 24 25 27   BUN 6 9 10   CREATININE 0.48 0.49 0.45  GLUCOSE 121* 230* 163*    Recent Labs Lab 07/22/16 0324 07/24/16 0248 07/25/16 0400  HGB 13.2 13.0 12.8  HCT 41.2 39.3 39.5  WBC 9.7 13.8* 11.7*  PLT 135* 215 232   Dg Chest Port 1 View  Result Date: 07/24/2016 CLINICAL DATA:  Respiratory failure, shortness of Breath EXAM: PORTABLE CHEST 1 VIEW COMPARISON:  07/20/2016 FINDINGS: Severe diffuse bilateral airspace disease, most confluent in the right upper lobe and left lower lobe. Findings are stable since prior study. No visible effusions. No acute bony abnormality. IMPRESSION: Severe diffuse bilateral airspace disease, unchanged. Electronically Signed   By: KeRolm Baptise.D.   On: 07/24/2016 09:43    ASSESSMENT / PLAN: Acute on Chronic Hypoxemic Respiratory Failure 2/2 Diffuse Bilateral Infiltrates  S/P Transbronchial Biopsy in 05/31/16 showing Focal Interstitial Fibrosis  CTD work up (-) except for elevated C4, mildly elevated aldolase. Lymphocytic predominance in BAL (05/2016)  Infiltrates "waxing and waning" with serial Chest CT scan  Pt with breast CA 15 yrs ago and also with liver lesions now concerning for CA.  Differentials : 1. Most likely this is IPF with severe  exacerbation. Possible NSIP as well.  2. Cryptogenic organizing PNA 3. Eosinophilic PNA 4. Less likely infectious as BAL was (-) in 05/2016 5. She can have a VTE on top of this process as her o2 requirement increased from 3L to [facility] since admission. Dopplers are (-) for DVT, SVT. This is less likely at this point.   6. Certainly, she can have CAD/CHF exacerbation.  Echo with 45-50% EF and diffuse hypokinesis back in 05/2016. Rpt Echo in 07/2015 with EF better at 55%. RV fxn mildly decreased. This is less likely with her echo being better this  admission.  7. Possible malignancy given lesions in the liver as well. Pt with breast CA, L, treated 2003.    Plan : 1.  I think at the end of the day, this will be " IPF" and she is having "ssevere IPF exacerbation".  Her chest ct scan however is NOT typical for IPF.  Pls see my last note on 2/14.  We extensively discussed prognosis and pt wants to be conservative and has made herself DNR.  She wants to go home hopefully next week.  2. Pt is S/P Pulse dose medrol, 3 days finished on 2/15 with 1 g/day.  Plan to decrease dose to 125 mg q6 (from 250 mgq6) Medrol for 24 hrs then decrease to 60 mg q 6 x 24 then 30 mg q 6 x 24 hrs then 30 mg q12 x 24hrs then switch to 40 mg of prednisone. Her ESR is better with pulse steroids so she likely is a responder to prednisone.  Likely d/c on Pred on a long slow taper.  3. Cont with NRBM, keep o2 sats > 88%. Trial with HFNC or pendant oximizer.  4. Keep euvolemic.  5. Finish off Abx for UTI. May give 7 days. 6. DVT prophylaxis. 7. folllow up on blood work (anti Sjogrens, anti histone, anti RNP sent on 2/14). Likely (-) as well.  8. Maybe palliative care consult.     Monica Becton, MD 07/25/2016, 7:49 AM [facility] Pager [phone] After 3 pm or if no answer, call [phone]

## 2016-07-26 LAB — GLUCOSE, CAPILLARY
GLUCOSE-CAPILLARY: 180 mg/dL — AB (ref 65–99)
Glucose-Capillary: 158 mg/dL — ABNORMAL HIGH (ref 65–99)
Glucose-Capillary: 226 mg/dL — ABNORMAL HIGH (ref 65–99)
Glucose-Capillary: 245 mg/dL — ABNORMAL HIGH (ref 65–99)

## 2016-07-26 MED ORDER — FUROSEMIDE 10 MG/ML IJ SOLN
40.0000 mg | Freq: Once | INTRAMUSCULAR | Status: AC
  Administered 2016-07-26: 40 mg via INTRAVENOUS
  Filled 2016-07-26: qty 4

## 2016-07-26 NOTE — Progress Notes (Addendum)
Progress Note    Kristina Schroeder  BZJ:[id number] DOB: 1940-07-07  DOA: 07/20/2016 PCP: Cari Caraway, MD    Brief Narrative:   Chief complaint: Follow-up syncope  Kristina Schroeder is an 76 y.o. female with a PMH of interstitial lung disease, pulmonary fibrosis, chronic hypoxic respiratory failure on 3 L of home oxygen, diabetes, breast cancer, chronic left bundle branch block, dyslipidemia and fibromyalgia who was admitted 07/20/16 for evaluation of 2 syncopal events. She was noted to be orthostatic upon evaluation by EMS and received 1 L of normal saline. She also had an increased oxygen requirement requiring a nonrebreather mask to maintain her oxygen saturations. She saw her pulmonologist 07/19/16 and had a high resolution CT scan 07/18/16 which showed patchy irregular areas of consolidation and groundglass opacities throughout the lungs, some of which are improved and some worsened compared to previous CT scan. Underlying pulmonary fibrosis and multiple attenuation lesions in the liver again identified. Recommendations were to start a slow prednisone taper.  Assessment/Plan:   Principal Problems:   Syncope and collapse/Orthostatic hypotension Noted to be orthostatic on presentation. Placed on IV hydration. Worsening hypoxia may be contributory Currently hemodynamically stable. 2-D echo shows EF of 55-60%, moderate pulmonary hypertension .    Acute on chronic respiratory failure in the setting of interstitial lung disease and pulmonary fibrosis/moderate pulmonary hypertension S/P Transbronchial Biopsy in 05/31/16 showing Focal Interstitial Fibrosis CTD work up (-) except for elevated C4, mildly elevated aldolase. Lymphocytic predominance in BAL (05/2016) Infiltrates "waxing and waning" with serial Chest CT scan) Chest x-ray done 07/20/16  patchy bilateral infiltrates apparent.   Respiratory virus panel negative.  S/P Pulse dose medrol, 3 days finished on 2/15 with 1 g/day.  Plan to decrease  dose to 125 mg q6 (from 250 mgq6) Medrol for 24 hrs then decrease to 60 mg q 6 x 24 then 30 mg q 6 x 24 hrs then 30 mg q12 x 24hrs then switch to 40 mg of prednisone. Dopplers negative for DVT. 2-D echo showed moderate pulmonary hypertension, EF 55-60%. Remains on high flow oxygen.   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 F/U  for myositis. (anti Jo negative, aldolase slightly elevated, ANA   negative ). CKs were normal. ESR and CRP very elevated Cont with NRBM, keep o2 sats > 88%. Trial with HFNC or pendant oximizer Continue steroid taper  Will be stable for discharge , once oxygen requirements less than 10 L     Left bundle branch hemiblock Monitoring on telemetry.   GERD (gastroesophageal reflux disease) Continue Protonix.   Type 2 diabetes mellitus uncontrolled, with long-term current use of insulin (HCC) and stage III CKD Currently being managed with Lantus  And resistant sliding scale. CBGs 115-318. Continue meal coverage to 11 units and  Lantus to 35 units given high-dose steroids. May need to be more aggressive with insulin therapy given high-dose steroids. Hemoglobin A1c 11.3% indicating poor control. Diabetes coordinator following. 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    Hyperlipidemia Continue Crestor and Zetia.    Hypokalemia Secondary to diuretic therapy. Repleted.    UTI Urine culture growing gram-negative rods. Empiric ceftriaxone continue   Family Communication/Anticipated D/C date and plan/Code Status   DVT prophylaxis: Lovenox ordered. Code Status: Full Code.  Family Communication: Husband updated at the bedside. Disposition Plan: Home when stable From a pulmonary standpoint, likely several more  days.   Medical Consultants:    Pulmonology   Procedures:    2 D Echo 07/22/16  Study Conclusions  - Left ventricle: The cavity size was  normal. Systolic function was   normal. The estimated ejection fraction was in the range of 55%   to 60%. Images were inadequate for LV wall motion assessment. The   study is not technically sufficient to allow evaluation of LV   diastolic function. - Mitral valve: There was mild regurgitation. - Right ventricle: The cavity size was mildly dilated. Wall   thickness was normal. Systolic function was mildly reduced. - Tricuspid valve: There was mild regurgitation. - Pulmonary arteries: PA peak pressure: 60 mm Hg (S).  Impressions:  - The right ventricular systolic pressure was increased consistent   with moderate pulmonary hypertension.   Bilateral lower extremity venous duplex   Preliminary report:  There is no DVT or SVT noted in the bilateral lower extremities.   Anti-Infectives:    Rocephin 07/22/16--->  Subjective:    she continues to be short of breath. Denies cough or chest pain. No further syncopal episodes.  Objective:    Vitals:   07/26/16 0733 07/26/16 0857 07/26/16 1109 07/26/16 1136  BP: 135/80   119/64  Pulse:    73  Resp:    (!) 22  Temp:  97.5 F (36.4 C)    TempSrc:  Oral    SpO2: 95% 98% 90% 96%  Weight:      Height:        Intake/Output Summary (Last 24 hours) at 07/26/16 1245 Last data filed at 07/26/16 0403  Gross per 24 hour  Intake              120 ml  Output             2200 ml  Net            -2080 ml   Filed Weights   07/25/16 0454 07/25/16 0533 07/26/16 0400  Weight: 68.1 kg (150 lb 2.1 oz) 67.7 kg (149 lb 4.8 oz) 65.2 kg (143 lb 11.2 oz)    Exam: General:  NAD, well nourished. Comfortable.  Neuro:  CN grossly intact. (-) lateralizing signs.  HEENT:  NCAT, MM dry Cardiovascular:  RRR, s1/s2 no m/r/g.  Trace to 1+ pitting edema b/l LE Lungs:  Crackles mid-bases.  no wheezing/rhonchi.  (-) accessory muscle use.  Abdomen:  Soft, non-tender, normal bowel sounds Musculoskeletal:  Normal bulk and tone, joints normal Skin:  No  rashes  Data Reviewed:   I have personally reviewed following labs and imaging studies:  Labs: Basic Metabolic Panel:  Recent Labs Lab 07/20/16 1856 07/21/16 0423 07/22/16 0324 07/24/16 0248 07/25/16 0400  NA 133* 133* 137 136 139  K 3.5 3.6 3.0* 4.2 3.6  CL 98* 99* 104 102 102  CO2 _0 GLUCOSE 257* 177* 121* 230* 163*  BUN 8 5* _1 CREATININE 0.64 0.55 0.48 0.49 0.45  CALCIUM 8.0* 8.1* 7.9* 8.6* 8.4*  MG  --   --   --  2.1  --   PHOS  --   --   --  2.0*  --    GFR Estimated Creatinine Clearance: 54.6 mL/min (by C-G formula based on SCr of 0.45 mg/dL). Liver Function Tests:  Recent Labs Lab 07/20/16 1856 07/24/16 0248 07/25/16 0400  AST 23  --  19  ALT 23  --  19  ALKPHOS 82  --  104  BILITOT 0.8  --  0.6  PROT 5.5*  --  5.6*  ALBUMIN 2.7* 2.4* 2.5*   CBC:  Recent Labs Lab 07/20/16 1856 07/21/16 0423 07/22/16 0324 07/24/16 0248 07/25/16 0400  WBC 12.0* 12.1* 9.7 13.8* 11.7*  NEUTROABS 10.1*  --   --   --   --   HGB 14.0 14.5 13.2 13.0 12.8  HCT 42.2 43.9 41.2 39.3 39.5  MCV 85.4 85.6 86.0 84.9 85.3  PLT 196 195 135* 215 232   CBG:  Recent Labs Lab 07/25/16 1118 07/25/16 1620 07/25/16 2059 07/26/16 0730 07/26/16 1142  GLUCAP 219* 211* 280* 158* 226*   Sepsis Labs:  Recent Labs Lab 07/20/16 1911 07/20/16 2138 07/21/16 0423 07/22/16 0324 07/24/16 0248 07/25/16 0400  PROCALCITON  --  0.10  --   --   --   --   WBC  --   --  12.1* 9.7 13.8* 11.7*  LATICACIDVEN 1.60  --   --   --   --   --     Microbiology Recent Results (from the past 240 hour(s))  Respiratory Panel by PCR     Status: None   Collection Time: 07/20/16  6:56 PM  Result Value Ref Range Status   Adenovirus NOT DETECTED NOT DETECTED Final   Coronavirus 229E NOT DETECTED NOT DETECTED Final   Coronavirus HKU1 NOT DETECTED NOT DETECTED Final   Coronavirus NL63 NOT DETECTED NOT DETECTED Final   Coronavirus OC43 NOT DETECTED NOT DETECTED Final    Metapneumovirus NOT DETECTED NOT DETECTED Final   Rhinovirus / Enterovirus NOT DETECTED NOT DETECTED Final   Influenza A NOT DETECTED NOT DETECTED Final   Influenza B NOT DETECTED NOT DETECTED Final   Parainfluenza Virus 1 NOT DETECTED NOT DETECTED Final   Parainfluenza Virus 2 NOT DETECTED NOT DETECTED Final   Parainfluenza Virus 3 NOT DETECTED NOT DETECTED Final   Parainfluenza Virus 4 NOT DETECTED NOT DETECTED Final   Respiratory Syncytial Virus NOT DETECTED NOT DETECTED Final   Bordetella pertussis NOT DETECTED NOT DETECTED Final   Chlamydophila pneumoniae NOT DETECTED NOT DETECTED Final   Mycoplasma pneumoniae NOT DETECTED NOT DETECTED Final  Culture, Urine     Status: Abnormal   Collection Time: 07/20/16  7:03 PM  Result Value Ref Range Status   Specimen Description URINE, RANDOM  Final   Special Requests ADDED AT 0256 ON [id number]  Final   Culture >=100,000 COLONIES/mL ESCHERICHIA COLI (A)  Final   Report Status 07/23/2016 FINAL  Final   Organism ID, Bacteria ESCHERICHIA COLI (A)  Final      Susceptibility   Escherichia coli - MIC*    AMPICILLIN >=32 RESISTANT Resistant     CEFAZOLIN 32 INTERMEDIATE Intermediate     CEFTRIAXONE <=1 SENSITIVE Sensitive     CIPROFLOXACIN <=0.25 SENSITIVE Sensitive     GENTAMICIN <=1 SENSITIVE Sensitive     IMIPENEM <=0.25 SENSITIVE Sensitive     NITROFURANTOIN <=16 SENSITIVE Sensitive     TRIMETH/SULFA <=20 SENSITIVE Sensitive     AMPICILLIN/SULBACTAM >=32 RESISTANT Resistant     PIP/TAZO <=4 SENSITIVE Sensitive     Extended ESBL NEGATIVE Sensitive     * >=100,000 COLONIES/mL ESCHERICHIA COLI  MRSA PCR Screening     Status: None   Collection Time: 07/21/16  8:52 AM  Result Value Ref Range Status   MRSA by PCR NEGATIVE NEGATIVE Final    Comment:        The GeneXpert MRSA Assay (FDA approved  for NASAL specimens only), is one component of a comprehensive MRSA colonization surveillance program. It is not intended to diagnose MRSA infection  nor to guide or monitor treatment for MRSA infections.     Radiology: No results found.  Medications:   . aspirin EC  81 mg Oral QODAY  . cefTRIAXone (ROCEPHIN)  IV  1 g Intravenous Q24H  . diltiazem  360 mg Oral Daily  . DULoxetine  60 mg Oral BID  . enoxaparin (LOVENOX) injection  40 mg Subcutaneous Q24H  . ezetimibe  10 mg Oral QHS  . guaiFENesin  600 mg Oral BID  . insulin aspart  0-20 Units Subcutaneous TID WC  . insulin aspart  0-5 Units Subcutaneous QHS  . insulin aspart  8 Units Subcutaneous TID WC  . insulin glargine  35 Units Subcutaneous QHS  . loratadine  10 mg Oral Daily  . mouth rinse  15 mL Mouth Rinse BID  . methylPREDNISolone (SOLU-MEDROL) injection  60 mg Intravenous Q6H   Followed by  . [START ON 07/27/2016] methylPREDNISolone (SOLU-MEDROL) injection  30 mg Intravenous Q6H   Followed by  . [START ON 07/28/2016] methylPREDNISolone (SOLU-MEDROL) injection  30 mg Intravenous Q12H   Followed by  . [START ON 07/29/2016] predniSONE  40 mg Oral Q breakfast  . nadolol  20 mg Oral Daily  . pantoprazole  40 mg Oral BID AC  . pneumococcal 23 valent vaccine  0.5 mL Intramuscular Tomorrow-1000  . psyllium  1 packet Oral BID  . rosuvastatin  5 mg Oral QHS  . sodium chloride flush  3 mL Intravenous Q12H   Continuous Infusions: . sodium chloride 10 mL/hr at 07/24/16 0700    Medical decision making is of high complexity and this patient is at high risk of deterioration, therefore this is a level 3 visit.  (> 4 problem points, >4 data points, high risk)   LOS: 5 days   [facility] Pager [phone]. If unable to reach me by pager, please call my cell phone at [phone].  *Please refer to [email or web address], password [email or web address] to get updated schedule on who will round on this patient, as hospitalists switch teams weekly. If 7PM-7AM, please contact night-coverage at [email or web address], password [email or web address] for any overnight needs.  07/26/2016, 12:45 PM

## 2016-07-26 NOTE — Evaluation (Signed)
Occupational Therapy Evaluation Patient Details Name: Kristina Schroeder MRN: [id number] DOB: May 27, 1941 Today's Date: 07/26/2016    History of Present Illness Kristina Schroeder is a 76 y.o. woman with a history of interstitial lung disease/pulmonary fibrosis causing chronic hypoxic respiratory failure (she is on 3L [place] at baseline), breast cancer, chronic LBBB, DM, GERD, dyslipidemia, and fibromyalgia who was in her baseline state of health  when she started feeling light-headed and called out to her husband.  He got to her in time to break her fall as she was having a syncopal episode in the doorway to the bathroom.  Her husband was able to get her to the ground, and he reports that she was unconscious for 10 minutes before she came to. When EMS personnel attempted to have her sit up, she had another syncopal episode, but she was only unconscious for 1-2 minutes the second time.     Clinical Impression   Pt reports she was independent with ADL PTA. Pt limited this session by respiratory status; SpO2 >84% (mainly 90-91%) on 15L O2 via NRB. Pt able to complete grooming activity in sitting with set up and supervision and tolerated sitting EOB ~10 minutes. Educated pt on breathing strategies and expelling secretions. Pt planning to d/c home with supervision from her husband who works and occasionally travels for work. At this time, recommending HHOT for follow up to maximize independence and safety for ADL and functional mobility upon return home. Pt would benefit from continued skilled OT to address established goals.    Follow Up Recommendations  Home health OT;Supervision/Assistance - 24 hour    Equipment Recommendations  3 in 1 bedside commode    Recommendations for Other Services       Precautions / Restrictions Precautions Precautions: Other (comment);Fall Precaution Comments: monitor o2 sats Restrictions Weight Bearing Restrictions: No      Mobility Bed Mobility Overal bed mobility:  Needs Assistance Bed Mobility: Supine to Sit;Sit to Supine     Supine to sit: Min guard Sit to supine: Min assist   General bed mobility comments: min/guard to EOB with cues for technique.  o2 went to 89-91% while sitting EOB on non rebreather mask at 15 L/min. Pt sat EOB for 10 mins before returning supine.  o2 dropped to 84% with returning supine and re-positioning in the bed.  Transfers Overall transfer level: Needs assistance Equipment used: None Transfers: Sit to/from Stand Sit to Stand: Min guard         General transfer comment: Pt did semi-stand to reposition pad with MIN/guard    Balance Overall balance assessment: History of Falls;Needs assistance Sitting-balance support: Feet supported;No upper extremity supported Sitting balance-Leahy Scale: Good                                      ADL Overall ADL's : Needs assistance/impaired Eating/Feeding: Set up;Bed level   Grooming: Set up;Supervision/safety;Sitting;Brushing hair                                 General ADL Comments: Pt limited to sitting EOB and bed level activities this session due to SpO2 down to >84% (mainly 90-91%) on 15L with NRB.      Vision     Perception     Praxis      Pertinent Vitals/Pain Pain Assessment: No/denies pain  Hand Dominance Right   Extremity/Trunk Assessment Upper Extremity Assessment Upper Extremity Assessment: Overall WFL for tasks assessed   Lower Extremity Assessment Lower Extremity Assessment: Defer to PT evaluation   Cervical / Trunk Assessment Cervical / Trunk Assessment: Normal   Communication Communication Communication: No difficulties   Cognition Arousal/Alertness: Awake/alert Behavior During Therapy: WFL for tasks assessed/performed Overall Cognitive Status: Within Functional Limits for tasks assessed                     General Comments       Exercises       Shoulder Instructions      Home Living  Family/patient expects to be discharged to:: Private residence Living Arrangements: Spouse/significant other Available Help at Discharge: Available PRN/intermittently;Family (supportive husband, but he works ) Type of Home: [place]: Two level;Able to live on main level with bedroom/bathroom     Bathroom Shower/Tub: Occupational psychologist: Standard     Home Equipment: Other (comment);Shower seat - built in   Additional Comments: 3L home o2 since December      Prior Functioning/Environment Level of Independence: Independent                 OT Problem List: Decreased activity tolerance;Impaired balance (sitting and/or standing);Cardiopulmonary status limiting activity   OT Treatment/Interventions: Self-care/ADL training;Therapeutic exercise;Energy conservation;DME and/or AE instruction;Therapeutic activities;Patient/family education;Balance training    OT Goals(Current goals can be found in the care plan section) Acute Rehab OT Goals Patient Stated Goal: to figure out exactly what is going on OT Goal Formulation: With patient/family Time For Goal Achievement: 08/09/16 Potential to Achieve Goals: Good ADL Goals Pt Will Perform Grooming: with supervision;standing;sitting Pt Will Perform Upper Body Bathing: with supervision;sitting Pt Will Perform Lower Body Bathing: with supervision;sit to/from stand Pt Will Perform Upper Body Dressing: with supervision;sitting Pt Will Perform Lower Body Dressing: with supervision;sit to/from stand Pt Will Transfer to Toilet: with supervision;ambulating;regular height toilet Pt Will Perform Toileting - Clothing Manipulation and hygiene: with supervision;sit to/from stand Pt Will Perform Tub/Shower Transfer: Shower transfer;with supervision;ambulating;shower seat Additional ADL Goal #1: Pt will verbally recall 3 energy conservation strategies and use during ADL.  OT Frequency: Min 2X/week   Barriers to D/C:             Co-evaluation PT/OT/SLP Co-Evaluation/Treatment: Yes Reason for Co-Treatment: For patient/therapist safety PT goals addressed during session: Mobility/safety with mobility OT goals addressed during session: ADL's and self-care      End of Session Equipment Utilized During Treatment: Oxygen Nurse Communication: Mobility status  Activity Tolerance: Patient tolerated treatment well;Treatment limited secondary to medical complications (Comment) Patient left: in bed;with call bell/phone within reach;with bed alarm set   Time: [id number] OT Time Calculation (min): 27 min Charges:  OT General Charges $OT Visit: 1 Procedure OT Evaluation $OT Eval Moderate Complexity: 1 Procedure G-Codes:     Binnie Kand M.S., OTR/L Pager: [phone]  07/26/2016, 2:15 PM

## 2016-07-26 NOTE — Progress Notes (Signed)
Dr. Allyson Sabal ordered pt to have [place]. I called Resp therapy and spoke with Mendie and she stated that [facility] does not have oximizers. I infromed Dr. Allyson Sabal. Pt continues to be on a NRB with sats in the mid 90s. However, pt has little tolerance for staying int he 90s with any exertion and when she takes off her mask to eat or take pills, she quickly desats in the 80s. I also updated Dr. Allyson Sabal on this

## 2016-07-26 NOTE — Progress Notes (Signed)
   [facility] PCCM > steroid taper has been placed > PCCM will follow up on the pt Monday or Tuesday to determine prednisone dose.  > cont to cut down Fio2. Keep sats > 88%   J. Shirl Harris, MD 07/26/2016, 12:00 PM [facility] Pager [phone] After 3 pm or if no answer, call [phone]

## 2016-07-26 NOTE — Evaluation (Signed)
Physical Therapy Evaluation Patient Details Name: Kristina Schroeder MRN: [id number] DOB: 1940-08-25 Today's Date: 07/26/2016   History of Present Illness  Kristina Schroeder is a 76 y.o. woman with a history of interstitial lung disease/pulmonary fibrosis causing chronic hypoxic respiratory failure (she is on 3L [place] at baseline), breast cancer, chronic LBBB, DM, GERD, dyslipidemia, and fibromyalgia who was in her baseline state of health  when she started feeling light-headed and called out to her husband.  He got to her in time to break her fall as she was having a syncopal episode in the doorway to the bathroom.  Her husband was able to get her to the ground, and he reports that she was unconscious for 10 minutes before she came to. When EMS personnel attempted to have her sit up, she had another syncopal episode, but she was only unconscious for 1-2 minutes the second time.    Clinical Impression  Pt admitted with above diagnosis. Pt currently with functional limitations due to the deficits listed below (see PT Problem List). Pt will benefit from skilled PT to increase their independence and safety with mobility to allow discharge to the venue listed below.  Pt's mobility is limited at this time due to de-sat to 89-91% with sitting EOB with non rebreather mask at 15 L/min o2.  Her husband is very supportive and recommend home with HHPT.  She may need increased A level at home as well compared to prior to admission.  Depending on her progress and prognosis, she may benefit from a w/c.  Will continue to assess DME needs during acute care.    Follow Up Recommendations Home health PT    Equipment Recommendations  Other (comment);Wheelchair (measurements PT) (possibly?  Will need to continue to assess)    Recommendations for Other Services       Precautions / Restrictions Precautions Precautions: Other (comment);Fall Precaution Comments: monitor o2 sats      Mobility  Bed Mobility Overal bed mobility:  Needs Assistance Bed Mobility: Supine to Sit     Supine to sit: Min guard     General bed mobility comments: min/guard to EOB with cues for technique.  o2 went to 89-91% while sitting EOB on non rebreather mask at 15 L/min. Pt sat EOB for 10 mins before returning supine.  o2 dropped to 84% with returning supine and re-positioning in the bed.  Transfers Overall transfer level: Needs assistance Equipment used: None Transfers: Sit to/from Stand Sit to Stand: Min guard         General transfer comment: Pt did semi-stand to reposition pad with MIN/guard  Ambulation/Gait                Stairs            Wheelchair Mobility    Modified Rankin (Stroke Patients Only)       Balance Overall balance assessment: History of Falls                                           Pertinent Vitals/Pain Pain Assessment: No/denies pain    Home Living Family/patient expects to be discharged to:: Private residence Living Arrangements: Spouse/significant other Available Help at Discharge: Available PRN/intermittently;Family (supportive husband, but he works) Type of Home: [place]: Two level;Able to live on main level with bedroom/bathroom Home Equipment: Other (comment);Shower  seat - built in Additional Comments: home o2 since December    Prior Function Level of Independence: Independent               Hand Dominance        Extremity/Trunk Assessment   Upper Extremity Assessment Upper Extremity Assessment: Defer to OT evaluation    Lower Extremity Assessment Lower Extremity Assessment: Generalized weakness       Communication   Communication: No difficulties  Cognition Arousal/Alertness: Awake/alert Behavior During Therapy: WFL for tasks assessed/performed Overall Cognitive Status: Within Functional Limits for tasks assessed                      General Comments General comments (skin integrity, edema, etc.):  PT demonstrated LE therex she could do in bed for strenghtening    Exercises     Assessment/Plan    PT Assessment Patient needs continued PT services  PT Problem List Decreased strength;Decreased activity tolerance;Decreased balance;Cardiopulmonary status limiting activity;Decreased mobility          PT Treatment Interventions Gait training;Functional mobility training;Stair training;Therapeutic activities;Therapeutic exercise;Balance training    PT Goals (Current goals can be found in the Care Plan section)  Acute Rehab PT Goals Patient Stated Goal: to figure out exactly what is going on PT Goal Formulation: With patient/family Time For Goal Achievement: 08/09/16 Potential to Achieve Goals: Fair    Frequency Min 3X/week   Barriers to discharge        Co-evaluation PT/OT/SLP Co-Evaluation/Treatment: Yes Reason for Co-Treatment: For patient/therapist safety PT goals addressed during session: Mobility/safety with mobility         End of Session Equipment Utilized During Treatment: Gait belt;Oxygen Activity Tolerance: Patient limited by fatigue Patient left: in bed;with call bell/phone within reach;with bed alarm set Nurse Communication: Mobility status         Time: [id number] PT Time Calculation (min) (ACUTE ONLY): 24 min   Charges:   PT Evaluation $PT Eval Moderate Complexity: 1 Procedure     PT G Codes:        [place] Yeagley LUBECK 07/26/2016, 11:46 AM

## 2016-07-27 DIAGNOSIS — R262 Difficulty in walking, not elsewhere classified: Secondary | ICD-10-CM

## 2016-07-27 LAB — BASIC METABOLIC PANEL
Anion gap: 10 (ref 5–15)
BUN: 12 mg/dL (ref 6–20)
CO2: 33 mmol/L — AB (ref 22–32)
CREATININE: 0.46 mg/dL (ref 0.44–1.00)
Calcium: 8.3 mg/dL — ABNORMAL LOW (ref 8.9–10.3)
Chloride: 93 mmol/L — ABNORMAL LOW (ref 101–111)
GFR calc non Af Amer: >60 mL/min (ref >60)
Glucose, Bld: 192 mg/dL — ABNORMAL HIGH (ref 65–99)
POTASSIUM: 3 mmol/L — AB (ref 3.5–5.1)
Sodium: 136 mmol/L (ref 135–145)

## 2016-07-27 LAB — GLUCOSE, CAPILLARY
GLUCOSE-CAPILLARY: 195 mg/dL — AB (ref 65–99)
GLUCOSE-CAPILLARY: 241 mg/dL — AB (ref 65–99)
GLUCOSE-CAPILLARY: 209 mg/dL — AB (ref 65–99)
Glucose-Capillary: 178 mg/dL — ABNORMAL HIGH (ref 65–99)

## 2016-07-27 LAB — MAGNESIUM: MAGNESIUM: 2.3 mg/dL (ref 1.7–2.4)

## 2016-07-27 MED ORDER — POTASSIUM CHLORIDE CRYS ER 20 MEQ PO TBCR
40.0000 meq | EXTENDED_RELEASE_TABLET | Freq: Three times a day (TID) | ORAL | Status: AC
  Administered 2016-07-27 (×3): 40 meq via ORAL
  Filled 2016-07-27 (×3): qty 2

## 2016-07-27 MED ORDER — NYSTATIN 100000 UNIT/ML MT SUSP
5.0000 mL | Freq: Four times a day (QID) | OROMUCOSAL | Status: DC
  Administered 2016-07-27 – 2016-07-30 (×11): 500000 [IU] via ORAL
  Filled 2016-07-27 (×11): qty 5

## 2016-07-27 NOTE — Care Management Note (Signed)
Case Management Note  Patient Details  Name: Kristina Schroeder MRN: [id number] Date of Birth: 11/25/1940  Subjective/Objective:   76 y.o. F admitted   07/20/2016 after syncopal episode. Pt has hx Pulmonary Fibrosis and Chronic Hypoxic Respiratory Failure. Have made [facility] aware she will need Oximizer prior to discharge an 3:1. PT has suggested may need w/c. HHPT/OT/RN/Resp Care/and HHaide. [facility] has accepted referral for all of above.                Action/Plan: Will continue to follow.    Expected Discharge Date:                  Expected Discharge Plan:  [place]  In-House Referral:     Discharge planning Services  CM Consult  Post Acute Care Choice:  Durable Medical Equipment, Home Health Choice offered to:  Patient  DME Arranged:  3-N-1, Pulse oximeter, Other see comment Psychologist, educational) DME Agency:  [place]:  RN, PT, OT, Respirator Therapy, Nurse's Aide, Disease Management [place] Agency:  [place]  Status of Service:  Completed, signed off  If discussed at [place] of Stay Meetings, dates discussed:    Additional Comments:  Delrae Sawyers, RN 07/27/2016, 9:39 AM

## 2016-07-27 NOTE — Progress Notes (Signed)
Progress Note    Kristina Schroeder  [id number] DOB: 1941/04/26  DOA: 07/20/2016 PCP: Cari Caraway, MD    Brief Narrative:   Chief complaint: Follow-up syncope  Kristina Schroeder is an 76 y.o. female with a PMH of interstitial lung disease, pulmonary fibrosis, chronic hypoxic respiratory failure on 3 L of home oxygen, diabetes, breast cancer, chronic left bundle branch block, dyslipidemia and fibromyalgia who was admitted 07/20/16 for evaluation of 2 syncopal events. She was noted to be orthostatic upon evaluation by EMS and received 1 L of normal saline. She also had an increased oxygen requirement requiring a nonrebreather mask to maintain her oxygen saturations. She saw her pulmonologist 07/19/16 and had a high resolution CT scan 07/18/16 which showed patchy irregular areas of consolidation and groundglass opacities throughout the lungs, some of which are improved and some worsened compared to previous CT scan. Underlying pulmonary fibrosis and multiple attenuation lesions in the liver again identified. Recommendations were to start a slow prednisone taper.  Assessment/Plan:   Principal Problems:  Syncope and collapse/Orthostatic hypotension Noted to be orthostatic on presentation. Initially on  IV hydration. Worsening hypoxia may be contributory Currently hemodynamically stable. 2-D echo shows EF of 55-60%, moderate pulmonary hypertension. Continue intermittent low-dose Lasix, to help with weaning oxygen .    Acute on chronic respiratory failure in the setting of interstitial lung disease and pulmonary fibrosis/moderate pulmonary hypertension S/P Transbronchial Biopsy in 05/31/16 showing Focal Interstitial Fibrosis CTD work up (-) except for elevated C4, mildly elevated aldolase. Lymphocytic predominance in BAL (05/2016) Infiltrates "waxing and waning" with serial Chest CT scan) Chest x-ray done 07/20/16  patchy bilateral infiltrates apparent.   Respiratory virus panel negative.  S/P Pulse  dose medrol, 3 days finished on 2/15 with 1 g/day.  Plan to decrease dose to 125 mg q6 (from 250 mgq6) Medrol for 24 hrs then decrease to 60 mg q 6 x 24 then 30 mg q 6 x 24 hrs then 30 mg q12 x 24hrs then switch to 40 mg of prednisone. Dopplers negative for DVT. 2-D echo showed moderate pulmonary hypertension, EF 55-60%. Remains on high flow oxygen.   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 F/U  for myositis. (anti Jo negative, aldolase slightly elevated, ANA   negative ). CKs were normal. ESR and CRP very elevated Cont with NRBM, keep o2 sats > 88%. Trial with HFNC or pendant oximizer Continue steroid taper Will be stable for discharge , once oxygen requirements less than 10 L Palliative care consultation for goals of care/hospice eligibility [prognosis cannot be determined at this point]    Left bundle branch hemiblock Monitoring on telemetry.   GERD (gastroesophageal reflux disease) Continue Protonix.   Type 2 diabetes mellitus uncontrolled, with long-term current use of insulin (HCC) and stage III CKD Currently being managed with Lantus  And resistant sliding scale. CBGs 115-318. Continue meal coverage to 11 units and  Lantus to 35 units given high-dose steroids. May need to be more aggressive with insulin therapy given high-dose steroids. Hemoglobin A1c 11.3% indicating poor control. Diabetes coordinator following. 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    Hyperlipidemia Continue Crestor and Zetia.    Hypokalemia Replete.    UTI Urine culture growing gram-negative rods. Empiric ceftriaxone continue   Family Communication/Anticipated D/C date and plan/Code Status   DVT prophylaxis: Lovenox ordered. Code Status: Full Code.  Family  Communication: Husband updated at the bedside. Disposition Plan: Home when stable From a pulmonary standpoint, likely several more  days.   Medical Consultants:    Pulmonology   Procedures:    2 D Echo 07/22/16  Study Conclusions  - Left ventricle: The cavity size was normal. Systolic function was   normal. The estimated ejection fraction was in the range of 55%   to 60%. Images were inadequate for LV wall motion assessment. The   study is not technically sufficient to allow evaluation of LV   diastolic function. - Mitral valve: There was mild regurgitation. - Right ventricle: The cavity size was mildly dilated. Wall   thickness was normal. Systolic function was mildly reduced. - Tricuspid valve: There was mild regurgitation. - Pulmonary arteries: PA peak pressure: 60 mm Hg (S).  Impressions:  - The right ventricular systolic pressure was increased consistent   with moderate pulmonary hypertension.   Bilateral lower extremity venous duplex   Preliminary report:  There is no DVT or SVT noted in the bilateral lower extremities.   Anti-Infectives:    Rocephin 07/22/16--->  Subjective:    she continues to be short of breath.   Objective:    Vitals:   07/26/16 2005 07/27/16 0010 07/27/16 0500 07/27/16 0744  BP: 102/68 114/69 128/72 120/60  Pulse: 76 78 80 83  Resp: (!) 32 17 18 20   Temp: 98.8 F (37.1 C) 99 F (37.2 C) 98.5 F (36.9 C) 98.3 F (36.8 C)  TempSrc:      SpO2: 93% 96% 95% 96%  Weight:   64.5 kg (142 lb 4.8 oz)   Height:        Intake/Output Summary (Last 24 hours) at 07/27/16 0915 Last data filed at 07/27/16 2671  Gross per 24 hour  Intake               90 ml  Output             1400 ml  Net            -1310 ml   Filed Weights   07/25/16 0533 07/26/16 0400 07/27/16 0500  Weight: 67.7 kg (149 lb 4.8 oz) 65.2 kg (143 lb 11.2 oz) 64.5 kg (142 lb 4.8 oz)    Exam: General:  NAD, well nourished. Comfortable.  Neuro:  CN grossly intact. (-) lateralizing signs.  HEENT:  NCAT, MM dry Cardiovascular:  RRR, s1/s2 no m/r/g.  Trace to 1+ pitting edema b/l LE Lungs:   Crackles mid-bases.  no wheezing/rhonchi.  (-) accessory muscle use.  Abdomen:  Soft, non-tender, normal bowel sounds Musculoskeletal:  Normal bulk and tone, joints normal Skin:  No rashes  Data Reviewed:   I have personally reviewed following labs and imaging studies:  Labs: Basic Metabolic Panel:  Recent Labs Lab 07/21/16 0423 07/22/16 0324 07/24/16 0248 07/25/16 0400 07/27/16 0513  NA 133* 137 136 139 136  K 3.6 3.0* 4.2 3.6 3.0*  CL 99* 104 102 102 93*  CO2 24 24 25 27  33*  GLUCOSE 177* 121* 230* 163* 192*  BUN 5* 6 9 10 12   CREATININE 0.55 0.48 0.49 0.45 0.46  CALCIUM 8.1* 7.9* 8.6* 8.4* 8.3*  MG  --   --  2.1  --   --   PHOS  --   --  2.0*  --   --    GFR Estimated Creatinine Clearance: 54.3 mL/min (by C-G formula based on SCr of 0.46 mg/dL). Liver Function Tests:  Recent  Labs Lab 07/20/16 1856 07/24/16 0248 07/25/16 0400  AST 23  --  19  ALT 23  --  19  ALKPHOS 82  --  104  BILITOT 0.8  --  0.6  PROT 5.5*  --  5.6*  ALBUMIN 2.7* 2.4* 2.5*   CBC:  Recent Labs Lab 07/20/16 1856 07/21/16 0423 07/22/16 0324 07/24/16 0248 07/25/16 0400  WBC 12.0* 12.1* 9.7 13.8* 11.7*  NEUTROABS 10.1*  --   --   --   --   HGB 14.0 14.5 13.2 13.0 12.8  HCT 42.2 43.9 41.2 39.3 39.5  MCV 85.4 85.6 86.0 84.9 85.3  PLT 196 195 135* 215 232   CBG:  Recent Labs Lab 07/26/16 0730 07/26/16 1142 07/26/16 1648 07/26/16 2154 07/27/16 0733  GLUCAP 158* 226* 245* 180* 195*   Sepsis Labs:  Recent Labs Lab 07/20/16 1911 07/20/16 2138 07/21/16 0423 07/22/16 0324 07/24/16 0248 07/25/16 0400  PROCALCITON  --  0.10  --   --   --   --   WBC  --   --  12.1* 9.7 13.8* 11.7*  LATICACIDVEN 1.60  --   --   --   --   --     Microbiology Recent Results (from the past 240 hour(s))  Respiratory Panel by PCR     Status: None   Collection Time: 07/20/16  6:56 PM  Result Value Ref Range Status   Adenovirus NOT DETECTED NOT DETECTED Final   Coronavirus 229E NOT DETECTED  NOT DETECTED Final   Coronavirus HKU1 NOT DETECTED NOT DETECTED Final   Coronavirus NL63 NOT DETECTED NOT DETECTED Final   Coronavirus OC43 NOT DETECTED NOT DETECTED Final   Metapneumovirus NOT DETECTED NOT DETECTED Final   Rhinovirus / Enterovirus NOT DETECTED NOT DETECTED Final   Influenza A NOT DETECTED NOT DETECTED Final   Influenza B NOT DETECTED NOT DETECTED Final   Parainfluenza Virus 1 NOT DETECTED NOT DETECTED Final   Parainfluenza Virus 2 NOT DETECTED NOT DETECTED Final   Parainfluenza Virus 3 NOT DETECTED NOT DETECTED Final   Parainfluenza Virus 4 NOT DETECTED NOT DETECTED Final   Respiratory Syncytial Virus NOT DETECTED NOT DETECTED Final   Bordetella pertussis NOT DETECTED NOT DETECTED Final   Chlamydophila pneumoniae NOT DETECTED NOT DETECTED Final   Mycoplasma pneumoniae NOT DETECTED NOT DETECTED Final  Culture, Urine     Status: Abnormal   Collection Time: 07/20/16  7:03 PM  Result Value Ref Range Status   Specimen Description URINE, RANDOM  Final   Special Requests ADDED AT 0256 ON [id number]  Final   Culture >=100,000 COLONIES/mL ESCHERICHIA COLI (A)  Final   Report Status 07/23/2016 FINAL  Final   Organism ID, Bacteria ESCHERICHIA COLI (A)  Final      Susceptibility   Escherichia coli - MIC*    AMPICILLIN >=32 RESISTANT Resistant     CEFAZOLIN 32 INTERMEDIATE Intermediate     CEFTRIAXONE <=1 SENSITIVE Sensitive     CIPROFLOXACIN <=0.25 SENSITIVE Sensitive     GENTAMICIN <=1 SENSITIVE Sensitive     IMIPENEM <=0.25 SENSITIVE Sensitive     NITROFURANTOIN <=16 SENSITIVE Sensitive     TRIMETH/SULFA <=20 SENSITIVE Sensitive     AMPICILLIN/SULBACTAM >=32 RESISTANT Resistant     PIP/TAZO <=4 SENSITIVE Sensitive     Extended ESBL NEGATIVE Sensitive     * >=100,000 COLONIES/mL ESCHERICHIA COLI  MRSA PCR Screening     Status: None   Collection Time: 07/21/16  8:52 AM  Result Value  Ref Range Status   MRSA by PCR NEGATIVE NEGATIVE Final    Comment:        The GeneXpert  MRSA Assay (FDA approved for NASAL specimens only), is one component of a comprehensive MRSA colonization surveillance program. It is not intended to diagnose MRSA infection nor to guide or monitor treatment for MRSA infections.     Radiology: No results found.  Medications:   . aspirin EC  81 mg Oral QODAY  . cefTRIAXone (ROCEPHIN)  IV  1 g Intravenous Q24H  . diltiazem  360 mg Oral Daily  . DULoxetine  60 mg Oral BID  . enoxaparin (LOVENOX) injection  40 mg Subcutaneous Q24H  . ezetimibe  10 mg Oral QHS  . guaiFENesin  600 mg Oral BID  . insulin aspart  0-20 Units Subcutaneous TID WC  . insulin aspart  0-5 Units Subcutaneous QHS  . insulin aspart  8 Units Subcutaneous TID WC  . insulin glargine  35 Units Subcutaneous QHS  . loratadine  10 mg Oral Daily  . mouth rinse  15 mL Mouth Rinse BID  . methylPREDNISolone (SOLU-MEDROL) injection  30 mg Intravenous Q6H   Followed by  . [START ON 07/28/2016] methylPREDNISolone (SOLU-MEDROL) injection  30 mg Intravenous Q12H   Followed by  . [START ON 07/29/2016] predniSONE  40 mg Oral Q breakfast  . nadolol  20 mg Oral Daily  . pantoprazole  40 mg Oral BID AC  . pneumococcal 23 valent vaccine  0.5 mL Intramuscular Tomorrow-1000  . potassium chloride  40 mEq Oral TID  . psyllium  1 packet Oral BID  . rosuvastatin  5 mg Oral QHS  . sodium chloride flush  3 mL Intravenous Q12H   Continuous Infusions: . sodium chloride 10 mL/hr at 07/24/16 0700    Medical decision making is of high complexity and this patient is at high risk of deterioration, therefore this is a level 3 visit.  (> 4 problem points, >4 data points, high risk)   LOS: 6 days   [facility] Pager [phone]. If unable to reach me by pager, please call my cell phone at [phone].  *Please refer to [email or web address], password [email or web address] to get updated schedule on who will round on this patient, as hospitalists switch teams weekly. If 7PM-7AM, please contact  night-coverage at [email or web address], password [email or web address] for any overnight needs.  07/27/2016, 9:15 AM

## 2016-07-28 LAB — COMPREHENSIVE METABOLIC PANEL
ALBUMIN: 2.6 g/dL — AB (ref 3.5–5.0)
ALK PHOS: 107 U/L (ref 38–126)
ALT: 23 U/L (ref 14–54)
ANION GAP: 9 (ref 5–15)
AST: 22 U/L (ref 15–41)
BUN: 13 mg/dL (ref 6–20)
CALCIUM: 8.5 mg/dL — AB (ref 8.9–10.3)
CHLORIDE: 98 mmol/L — AB (ref 101–111)
CO2: 30 mmol/L (ref 22–32)
Creatinine, Ser: 0.43 mg/dL — ABNORMAL LOW (ref 0.44–1.00)
GFR calc non Af Amer: >60 mL/min (ref >60)
GLUCOSE: 154 mg/dL — AB (ref 65–99)
POTASSIUM: 4.8 mmol/L (ref 3.5–5.1)
SODIUM: 137 mmol/L (ref 135–145)
Total Bilirubin: 0.6 mg/dL (ref 0.3–1.2)
Total Protein: 5.9 g/dL — ABNORMAL LOW (ref 6.5–8.1)

## 2016-07-28 LAB — GLUCOSE, CAPILLARY
GLUCOSE-CAPILLARY: 265 mg/dL — AB (ref 65–99)
GLUCOSE-CAPILLARY: 195 mg/dL — AB (ref 65–99)
Glucose-Capillary: 160 mg/dL — ABNORMAL HIGH (ref 65–99)
Glucose-Capillary: 271 mg/dL — ABNORMAL HIGH (ref 65–99)

## 2016-07-28 MED ORDER — FUROSEMIDE 10 MG/ML IJ SOLN
40.0000 mg | Freq: Once | INTRAMUSCULAR | Status: AC
  Administered 2016-07-28: 40 mg via INTRAVENOUS
  Filled 2016-07-28: qty 4

## 2016-07-28 NOTE — Progress Notes (Signed)
Progress Note    ENZLEY KITCHENS  OYD:[id number] DOB: 07-18-1940  DOA: 07/20/2016 PCP: Cari Caraway, MD    Brief Narrative:   Chief complaint: Follow-up syncope  Kristina Schroeder is an 76 y.o. female with a PMH of interstitial lung disease, pulmonary fibrosis, chronic hypoxic respiratory failure on 3 L of home oxygen, diabetes, breast cancer, chronic left bundle branch block, dyslipidemia and fibromyalgia who was admitted 07/20/16 for evaluation of 2 syncopal events. She was noted to be orthostatic upon evaluation by EMS and received 1 L of normal saline. She also had an increased oxygen requirement requiring a nonrebreather mask to maintain her oxygen saturations. She saw her pulmonologist 07/19/16 and had a high resolution CT scan 07/18/16 which showed patchy irregular areas of consolidation and groundglass opacities throughout the lungs, some of which are improved and some worsened compared to previous CT scan. Underlying pulmonary fibrosis and multiple attenuation lesions in the liver again identified. Recommendations were to start a slow prednisone taper.  Assessment/Plan:   Principal Problems:  Syncope and collapse/Orthostatic hypotension Noted to be orthostatic on presentation. Initially on  IV hydration. Worsening hypoxia may be contributory Currently hemodynamically stable. 2-D echo shows EF of 55-60%, moderate pulmonary hypertension. Continue intermittent low-dose Lasix, to help with weaning oxygen .    Acute on chronic respiratory failure in the setting of interstitial lung disease and pulmonary fibrosis/moderate pulmonary hypertension S/P Transbronchial Biopsy in 05/31/16 showing Focal Interstitial Fibrosis CTD work up (-) except for elevated C4, mildly elevated aldolase. Lymphocytic predominance in BAL (05/2016) Infiltrates "waxing and waning" with serial Chest CT scan) Chest x-ray done 07/20/16  patchy bilateral infiltrates apparent.   Respiratory virus panel negative.  S/P Pulse  dose medrol, 3 days finished on 2/15 with 1 g/day.  Plan to decrease dose to 125 mg q6 (from 250 mgq6) Medrol for 24 hrs then decrease to 60 mg q 6 x 24 then 30 mg q 6 x 24 hrs then 30 mg q12 x 24hrs then switch to 40 mg of prednisone. Dopplers negative for DVT. 2-D echo showed moderate pulmonary hypertension, EF 55-60%. Remains on high flow oxygen.   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 F/U  for myositis. (anti Jo negative, aldolase slightly elevated, ANA   negative ). CKs were normal. ESR and CRP very elevated Cont with NRBM, keep o2 sats > 88%. Trial with HFNC or pendant oximizer Continue steroid taper Will be stable for discharge , once oxygen requirements less than 10 L Palliative care consultation for goals of care/hospice eligibility [prognosis cannot be determined at this point], likely poor  requested PCCM to discuss with husband 2/19, per his request   Left bundle branch hemiblock Monitoring on telemetry.   GERD (gastroesophageal reflux disease) Continue Protonix.   Type 2 diabetes mellitus uncontrolled, with long-term current use of insulin (HCC) and stage III CKD Continue NovoLog 8 units and  Lantus to 35 units given high-dose steroids. May need to be more aggressive with insulin therapy given high-dose steroids. Hemoglobin A1c 11.3% indicating poor control. Diabetes coordinator following. 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    Hyperlipidemia Continue Crestor and Zetia.    Hypokalemia Replete.    UTI Urine culture growing gram-negative rods. Empiric ceftriaxone continue. DC Rocephin 2/19   Family Communication/Anticipated D/C date and plan/Code Status   DVT prophylaxis: Lovenox ordered. Code Status: Full Code.  Family Communication:  Daughter/grandson/husband  updated Disposition Plan: Home when stable From a pulmonary standpoint, likely several  more days.   Medical Consultants:    Pulmonology  Palliative care   Procedures:    2 D Echo 07/22/16  Study Conclusions  - Left ventricle: The cavity size was normal. Systolic function was   normal. The estimated ejection fraction was in the range of 55%   to 60%. Images were inadequate for LV wall motion assessment. The   study is not technically sufficient to allow evaluation of LV   diastolic function. - Mitral valve: There was mild regurgitation. - Right ventricle: The cavity size was mildly dilated. Wall   thickness was normal. Systolic function was mildly reduced. - Tricuspid valve: There was mild regurgitation. - Pulmonary arteries: PA peak pressure: 60 mm Hg (S).  Impressions:  - The right ventricular systolic pressure was increased consistent   with moderate pulmonary hypertension.   Bilateral lower extremity venous duplex   Preliminary report:  There is no DVT or SVT noted in the bilateral lower extremities.   Anti-Infectives:    Rocephin 07/22/16--->  Subjective:    she continues to be short of breath. Husband by the bedside   Objective:    Vitals:   07/27/16 1531 07/27/16 1950 07/28/16 0047 07/28/16 0332  BP: 124/65 (!) 137/125 114/77 120/73  Pulse: 88 94 87 86  Resp: 20 (!) 21 16 17   Temp: 97.5 F (36.4 C) 98.5 F (36.9 C) 98.6 F (37 C) 97.7 F (36.5 C)  TempSrc: Oral   Axillary  SpO2: 97% 96% 97% 95%  Weight:    66.5 kg (146 lb 9.6 oz)  Height:        Intake/Output Summary (Last 24 hours) at 07/28/16 0842 Last data filed at 07/28/16 0339  Gross per 24 hour  Intake              840 ml  Output              500 ml  Net              340 ml   Filed Weights   07/26/16 0400 07/27/16 0500 07/28/16 0332  Weight: 65.2 kg (143 lb 11.2 oz) 64.5 kg (142 lb 4.8 oz) 66.5 kg (146 lb 9.6 oz)    Exam: General:  NAD, well nourished. Comfortable.  Neuro:  CN grossly intact. (-) lateralizing signs.  HEENT:  NCAT, MM dry Cardiovascular:   RRR, s1/s2 no m/r/g.  Trace to 1+ pitting edema b/l LE Lungs:  Crackles mid-bases.  no wheezing/rhonchi.  (-) accessory muscle use.  Abdomen:  Soft, non-tender, normal bowel sounds Musculoskeletal:  Normal bulk and tone, joints normal Skin:  No rashes  Data Reviewed:   I have personally reviewed following labs and imaging studies:  Labs: Basic Metabolic Panel:  Recent Labs Lab 07/22/16 0324 07/24/16 0248 07/25/16 0400 07/27/16 0513 07/27/16 1122 07/28/16 0353  NA 137 136 139 136  --  137  K 3.0* 4.2 3.6 3.0*  --  4.8  CL 104 102 102 93*  --  98*  CO2 24 25 27  33*  --  30  GLUCOSE 121* 230* 163* 192*  --  154*  BUN 6 9 10 12   --  13  CREATININE 0.48 0.49 0.45 0.46  --  0.43*  CALCIUM 7.9* 8.6* 8.4* 8.3*  --  8.5*  MG  --  2.1  --   --  2.3  --  PHOS  --  2.0*  --   --   --   --    GFR Estimated Creatinine Clearance: 55.1 mL/min (by C-G formula based on SCr of 0.43 mg/dL (L)). Liver Function Tests:  Recent Labs Lab 07/24/16 0248 07/25/16 0400 07/28/16 0353  AST  --  19 22  ALT  --  19 23  ALKPHOS  --  104 107  BILITOT  --  0.6 0.6  PROT  --  5.6* 5.9*  ALBUMIN 2.4* 2.5* 2.6*   CBC:  Recent Labs Lab 07/22/16 0324 07/24/16 0248 07/25/16 0400  WBC 9.7 13.8* 11.7*  HGB 13.2 13.0 12.8  HCT 41.2 39.3 39.5  MCV 86.0 84.9 85.3  PLT 135* 215 232   CBG:  Recent Labs Lab 07/27/16 0733 07/27/16 1154 07/27/16 1631 07/27/16 2118 07/28/16 0734  GLUCAP 195* 241* 209* 178* 160*   Sepsis Labs:  Recent Labs Lab 07/22/16 0324 07/24/16 0248 07/25/16 0400  WBC 9.7 13.8* 11.7*    Microbiology Recent Results (from the past 240 hour(s))  Respiratory Panel by PCR     Status: None   Collection Time: 07/20/16  6:56 PM  Result Value Ref Range Status   Adenovirus NOT DETECTED NOT DETECTED Final   Coronavirus 229E NOT DETECTED NOT DETECTED Final   Coronavirus HKU1 NOT DETECTED NOT DETECTED Final   Coronavirus NL63 NOT DETECTED NOT DETECTED Final    Coronavirus OC43 NOT DETECTED NOT DETECTED Final   Metapneumovirus NOT DETECTED NOT DETECTED Final   Rhinovirus / Enterovirus NOT DETECTED NOT DETECTED Final   Influenza A NOT DETECTED NOT DETECTED Final   Influenza B NOT DETECTED NOT DETECTED Final   Parainfluenza Virus 1 NOT DETECTED NOT DETECTED Final   Parainfluenza Virus 2 NOT DETECTED NOT DETECTED Final   Parainfluenza Virus 3 NOT DETECTED NOT DETECTED Final   Parainfluenza Virus 4 NOT DETECTED NOT DETECTED Final   Respiratory Syncytial Virus NOT DETECTED NOT DETECTED Final   Bordetella pertussis NOT DETECTED NOT DETECTED Final   Chlamydophila pneumoniae NOT DETECTED NOT DETECTED Final   Mycoplasma pneumoniae NOT DETECTED NOT DETECTED Final  Culture, Urine     Status: Abnormal   Collection Time: 07/20/16  7:03 PM  Result Value Ref Range Status   Specimen Description URINE, RANDOM  Final   Special Requests ADDED AT 0256 ON [id number]  Final   Culture >=100,000 COLONIES/mL ESCHERICHIA COLI (A)  Final   Report Status 07/23/2016 FINAL  Final   Organism ID, Bacteria ESCHERICHIA COLI (A)  Final      Susceptibility   Escherichia coli - MIC*    AMPICILLIN >=32 RESISTANT Resistant     CEFAZOLIN 32 INTERMEDIATE Intermediate     CEFTRIAXONE <=1 SENSITIVE Sensitive     CIPROFLOXACIN <=0.25 SENSITIVE Sensitive     GENTAMICIN <=1 SENSITIVE Sensitive     IMIPENEM <=0.25 SENSITIVE Sensitive     NITROFURANTOIN <=16 SENSITIVE Sensitive     TRIMETH/SULFA <=20 SENSITIVE Sensitive     AMPICILLIN/SULBACTAM >=32 RESISTANT Resistant     PIP/TAZO <=4 SENSITIVE Sensitive     Extended ESBL NEGATIVE Sensitive     * >=100,000 COLONIES/mL ESCHERICHIA COLI  MRSA PCR Screening     Status: None   Collection Time: 07/21/16  8:52 AM  Result Value Ref Range Status   MRSA by PCR NEGATIVE NEGATIVE Final    Comment:        The GeneXpert MRSA Assay (FDA approved for NASAL specimens only), is one component of a comprehensive  MRSA colonization surveillance  program. It is not intended to diagnose MRSA infection nor to guide or monitor treatment for MRSA infections.     Radiology: No results found.  Medications:   . aspirin EC  81 mg Oral QODAY  . cefTRIAXone (ROCEPHIN)  IV  1 g Intravenous Q24H  . diltiazem  360 mg Oral Daily  . DULoxetine  60 mg Oral BID  . enoxaparin (LOVENOX) injection  40 mg Subcutaneous Q24H  . ezetimibe  10 mg Oral QHS  . furosemide  40 mg Intravenous Once  . guaiFENesin  600 mg Oral BID  . insulin aspart  0-20 Units Subcutaneous TID WC  . insulin aspart  0-5 Units Subcutaneous QHS  . insulin aspart  8 Units Subcutaneous TID WC  . insulin glargine  35 Units Subcutaneous QHS  . loratadine  10 mg Oral Daily  . mouth rinse  15 mL Mouth Rinse BID  . methylPREDNISolone (SOLU-MEDROL) injection  30 mg Intravenous Q12H   Followed by  . [START ON 07/29/2016] predniSONE  40 mg Oral Q breakfast  . nadolol  20 mg Oral Daily  . nystatin  5 mL Oral QID  . pantoprazole  40 mg Oral BID AC  . pneumococcal 23 valent vaccine  0.5 mL Intramuscular Tomorrow-1000  . psyllium  1 packet Oral BID  . rosuvastatin  5 mg Oral QHS  . sodium chloride flush  3 mL Intravenous Q12H   Continuous Infusions: . sodium chloride 10 mL/hr at 07/24/16 0700    Medical decision making is of high complexity and this patient is at high risk of deterioration, therefore this is a level 3 visit.  (> 4 problem points, >4 data points, high risk)   LOS: 7 days   [facility] Pager [phone]. If unable to reach me by pager, please call my cell phone at [phone].  *Please refer to [email or web address], password [email or web address] to get updated schedule on who will round on this patient, as hospitalists switch teams weekly. If 7PM-7AM, please contact night-coverage at [email or web address], password [email or web address] for any overnight needs.  07/28/2016, 8:42 AM

## 2016-07-29 DIAGNOSIS — Z515 Encounter for palliative care: Secondary | ICD-10-CM

## 2016-07-29 DIAGNOSIS — Z7189 Other specified counseling: Secondary | ICD-10-CM

## 2016-07-29 LAB — GLUCOSE, CAPILLARY
GLUCOSE-CAPILLARY: 102 mg/dL — AB (ref 65–99)
GLUCOSE-CAPILLARY: 193 mg/dL — AB (ref 65–99)
Glucose-Capillary: 222 mg/dL — ABNORMAL HIGH (ref 65–99)
Glucose-Capillary: 245 mg/dL — ABNORMAL HIGH (ref 65–99)

## 2016-07-29 NOTE — Progress Notes (Signed)
PT Cancellation Note/ Discharge  Patient Details Name: Kristina Schroeder MRN: [id number] DOB: May 15, 1941   Cancelled Treatment:    Reason Eval/Treat Not Completed: Other (comment) (family deferred all further therapy stating they prefer to pursue hospice and comfort measures. Will sign off per pt and family request)   [name] 07/29/2016, 1:09 PM  Elwyn Reach, [place]

## 2016-07-29 NOTE — Clinical Social Work Note (Signed)
[facility] home referral made this afternoon. Yetta Glassman with facility plans to meet with the family at 12:30PM on 2/20. CSW will assist with DC when appropriate.  Liz Beach MSW, [place], [place], [id number]

## 2016-07-29 NOTE — Care Management Important Message (Signed)
Important Message  Patient Details  Name: Kristina Schroeder MRN: [id number] Date of Birth: 01-27-1941   Medicare Important Message Given:  Yes    Nathen May 07/29/2016, 12:17 PM

## 2016-07-29 NOTE — Progress Notes (Signed)
Name: Kristina Schroeder MRN: [id number] DOB: 12-07-1940    ADMISSION DATE:  07/20/2016 CONSULTATION DATE:  07/20/2016  REFERRING MD :  Dr. Eulas Post  CHIEF COMPLAINT:  SOB, ILD Brief  75 yo female never smoker with hx fibromyalgia, chronic LBBB, sinus tachycardia regulated with B blocker and diltiazem, DM, GERD.  She was recently diagnosed with an ILD of unknown etiology and placed on 1m then increased to 691mof prednisone which she has been on for 1 month.  She was seen by her pulmonologist yesterday.  She also has liver lesions concerning for malignancy.   She states her SOB symptoms started in 05/2016 and have been only slightly better on the steroids.  She is on 3L of O2 with rest and exertion.  She reports that her O2 sats drop with exertion into the high 80s but she does not increase her O2.  Today she was walking to the bathroom and felt acutely SOB and diaphoretic.  She denies, chest pain, or vertigo.  She called for her husband and lost consciousness.  Husband denies any seizure like activity, bowel, bladder incontinence and she regained consciousness in seconds.     SUBJECTIVE/OVERNIGHT/INTERVAL HX 2.19 - met with husband, patient, daughter and in conference at bedside with palliative NP SaCharlynn CourtQuestions on prognosis. She says since 07/20/2016 not any better. Still on face mask. ECOG 4 with class 4 dsypnea and easy desats. Family want to know prognosis  VITAL SIGNS: Temp:  [97.6 F (36.4 C)-98.6 F (37 C)] 98.6 F (37 C) (02/19 0752) Pulse Rate:  [86-96] 96 (02/19 0752) Resp:  [14-17] 17 (02/19 0752) BP: (89-118)/(63-88) 89/63 (02/19 0752) SpO2:  [94 %-96 %] 96 % (02/19 0752) Weight:  [65.6 kg (144 lb 9.6 oz)] 65.6 kg (144 lb 9.6 oz) (02/19 0437)  PHYSICAL EXAMINATION: General: looks unwell Resp: Face mask o2, easy desats, crackles. Mild increased wob + CNS: AxOx3   Recent Labs Lab 07/25/16 0400 07/27/16 0513 07/28/16 0353  NA 139 136 137  K 3.6 3.0* 4.8  CL 102  93* 98*  CO2 27 33* 30  BUN _0 CREATININE 0.45 0.46 0.43*  GLUCOSE 163* 192* 154*    Recent Labs Lab 07/24/16 0248 07/25/16 0400  HGB 13.0 12.8  HCT 39.3 39.5  WBC 13.8* 11.7*  PLT 215 232   No results found.  ASSESSMENT / PLAN:   PROBL Acute on Chronic Hypoxemic Respiratory Failure 2/2 Diffuse Bilateral Infiltrates  S/P Transbronchial Biopsy in 05/31/16 showing Focal Interstitial Fibrosis  CTD work up (-) except for elevated C4, mildly elevated aldolase. Lymphocytic predominance in BAL  But with 11% eois(05/2016)  Infiltrates "waxing and waning" with serial Chest CT scan  Pt with breast CA 15 yrs ago and also with liver lesions now concerning for CA.    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 Other Ddx are BOOP/COP, AIP etc.  - explained surgical lung bx too risky - given lack of improvement with steroids in 1 weeks, 30d mortality is 50% and survivors set new low baseline with ecog 3/4 and small < 5% perhaps can improve to [place] level o2   - they are appreciative of the advice  - they will talk to palliative care about going home   PCCM will see every few days   > 50% of this > 40 min visit spent  in face to face counseling or/and coordination of care     Dr. Brand Males, M.D., [facility].C.P Pulmonary and Critical Care Medicine Staff Physician [place] [facility] Pager: [phone], If no answer or between  15:00h - 7:00h: call [phone]  07/29/2016 1:40 PM

## 2016-07-29 NOTE — Consult Note (Signed)
Consultation Note Date: 07/29/2016   Patient Name: ADALEIGH WARF  DOB: 04/16/1941  MRN: [id number]  Age / Sex: 76 y.o., female  PCP: Gweneth Dimitri, MD Referring Physician: Richarda Overlie, MD  Reason for Consultation: Disposition, Establishing goals of care, Hospice Evaluation and Psychosocial/spiritual support  HPI/Patient Profile: 76 y.o. female  with past medical history of newly diagnosised interstitial lung disease, pulmonary fibrosis, chronic hypoxic respiratory failure on 3L O2, uncontrolled DM, breast cancer (2003 s/p lumpectomy, chemo, and XRT), LBBB, HLD, and fibromyalgia who presented to the ED after 2 syncopal episodes, and was subsequently admitted on 07/20/2016 with severe idiopathic pulmonary fibrosis exacerbation. She remains on NRB and desaturated with eating or taking medication.   Of note, CT chest done 12/20 to evaluate new hypoxia noted multiple liver lesions, concerning for potential metastatic disease. Biopsy of a liver lesion and PET scan trying to be arranged.   Clinical Assessment and Goals of Care: I met with Mrs. Seres at her bedside, family was present in the room. I introduced myself and explained my role on her care team. Specifically, I explained that Palliative Care helped patients and their family manage the stress and symptoms associated with serious illness. We also help them understand the information given, explore their goals of care, and work to align their goals with available interventions.   For Mrs. Berrie, she continues to require NRB at 15L with no real change in her respiratory status despite steroids. She quickly desaturates with any movement, including eating or taking medication. The plan for today is to trial a pendant oximyzer, which I discussed with Mrs. Carolin Coy. Per primary team, this device should be brought to the floor today. PCCM is also expected to provide a clinical update to her and her family. I emphasized  with Mrs. Goates that her conversation with PCCM would shape our conversation, being frank that their expectation for improvement [if any] and timeframe for that may help direct her goals of care.   UPDATE: I was able to be present during PCCM's conversation with Mrs. Lichtenberg and her family. They provided a detailed overview of her pulmonary issues, explained the rationale of management interventions, and helped answer numerous questions. In terms of prognosis, they explained the overall poor prognosis in light of her flare and minimal response to steroids. She may improve (which could take days to weeks), but her baseline O2 requirement will likely be higher, and deteriorations remains likely.   After the team left I sat down with Mrs. Mcwatters, her husband, and her daughter. I asked what she had heard, and Mrs. Sliwinski was able to say that her breathing would likely not get better (given the poor response to steroids), and that her time was limited to likely less than a month. I reaffirmed these things, careful to explain that there was a chance for improvement, but the overall prognosis was poor. This poor prognosis is also supported by her new liver lesions that are concerning for metastatic disease. In light of this information, she does not want to pursue any further aggressive intervention (no lung or liver biopsy) and instead wants to focus on being comfortable and spending quality time with her family.   In terms of discharge, home is not a realistic option. She would not have 24hr caregiver availability (her husband and children work full time), and she continues to have very high O2 requirement. Given her lack of respiratory improvement despite aggressive steroids, and the likely metastatic disease in her liver (unknown primary),  I would expect progressive deterioration and ongoing high symptom burden. I brought up the possibility of residential hospice, which her and her family were interested in pursuing.     Primary Decision Maker PATIENT   SUMMARY OF RECOMMENDATIONS    DNR  SW consulted for residential hospice referral ([facility])  Plan to medically optimize, then likely discharge to residential hospice. Plan to continue steroids after discharge per PCCM taper plan.  Symptoms  I recommended trying morphine for dyspnea, however she did not want to as she became nauseated from it in the past. I offered antiemetics concurrently, but she felt Ultram was all she needed. Will re-address tomorrow again.   Code Status/Advance Care Planning:  DNR  Palliative Prophylaxis:   Frequent Pain Assessment  Additional Recommendations (Limitations, Scope, Preferences):  Plan to medically optimize, then discharge with ongoing steroid taper to residential hospice.   Psycho-social/Spiritual:   Desire for further Chaplaincy support:no  Additional Recommendations: Education on Hospice  Prognosis:   < 4 weeks  Discharge Planning: Hospice facility      Primary Diagnoses: Present on Admission: . Left bundle branch hemiblock . GERD (gastroesophageal reflux disease) . Acute on chronic respiratory failure with hypoxemia (HCC) . ILD (interstitial lung disease) (HCC) . Syncope and collapse . Respiratory failure (HCC) . Urinary tract infection . Pulmonary hypertension   I have reviewed the medical record, interviewed the patient and family, and examined the patient. The following aspects are pertinent.  Past Medical History:  Diagnosis Date  . Allergy   . Bronchitis    acute  . Bundle branch block left   . Cancer [facility])    Breast  . 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 Degenerative joint disease   . Dyslipidemia   . Fibromyalgia   . Gastritis   . GERD (gastroesophageal reflux disease)   . Hyperlipidemia   . IBS (irritable bowel syndrome)   . Left ventricular dysfunction   . Shingles    Social History   Social History  . Marital status:  Married    Spouse name: N/A  . Number of children: 1  . Years of education: N/A   Social History Main Topics  . Smoking status: Never Smoker  . Smokeless tobacco: Never Used  . Alcohol use No  . Drug use: Unknown  . Sexual activity: Not Asked   Other Topics Concern  . None   Social History Narrative  . None   Family History  Problem Relation Age of Onset  . Heart disease Mother   . Heart disease Sister   . Diabetes Sister   . Arthritis Father   . Cancer Father   . Crohn's disease Father   . Cancer Paternal Aunt    Scheduled Meds: . aspirin EC  81 mg Oral QODAY  . cefTRIAXone (ROCEPHIN)  IV  1 g Intravenous Q24H  . diltiazem  360 mg Oral Daily  . DULoxetine  60 mg Oral BID  . enoxaparin (LOVENOX) injection  40 mg Subcutaneous Q24H  . ezetimibe  10 mg Oral QHS  . guaiFENesin  600 mg Oral BID  . insulin aspart  0-20 Units Subcutaneous TID WC  . insulin aspart  0-5 Units Subcutaneous QHS  . insulin aspart  8 Units Subcutaneous TID WC  . insulin glargine  35 Units Subcutaneous QHS  . loratadine  10 mg Oral Daily  . mouth rinse  15 mL Mouth Rinse BID  . nadolol  20 mg Oral  Daily  . nystatin  5 mL Oral QID  . pantoprazole  40 mg Oral BID AC  . pneumococcal 23 valent vaccine  0.5 mL Intramuscular Tomorrow-1000  . predniSONE  40 mg Oral Q breakfast  . psyllium  1 packet Oral BID  . rosuvastatin  5 mg Oral QHS  . sodium chloride flush  3 mL Intravenous Q12H   Continuous Infusions: . sodium chloride 10 mL/hr at 07/24/16 0700   PRN Meds:.acetaminophen **OR** acetaminophen, baclofen, lip balm, ondansetron **OR** ondansetron (ZOFRAN) IV, traMADol, zolpidem Allergies  Allergen Reactions  . Demerol Nausea And Vomiting       . Hydrocodone-Acetaminophen Nausea And Vomiting       . Meperidine Hcl Nausea And Vomiting       . Morphine And Related Nausea And Vomiting        . Vioxx [Rofecoxib] Nausea And Vomiting          Review of Systems  Constitutional: Positive for  activity change, appetite change and fatigue.  HENT: Negative for congestion, facial swelling, hearing loss, sinus pressure, sore throat and trouble swallowing.   Eyes: Negative for visual disturbance.  Respiratory: Positive for cough and shortness of breath. Negative for chest tightness and wheezing.   Cardiovascular: Negative for chest pain.  Gastrointestinal: Negative for abdominal distention and abdominal pain.  Neurological: Positive for syncope (PTA) and weakness. Negative for dizziness, speech difficulty and light-headedness.  Psychiatric/Behavioral: Negative for agitation, decreased concentration and sleep disturbance. The patient is nervous/anxious.    Physical Exam  Constitutional: She is oriented to person, place, and time. She appears well-developed and well-nourished. No distress.  HENT:  Head: Normocephalic and atraumatic.  Mouth/Throat: Oropharynx is clear and moist. No oropharyngeal exudate.  Eyes: EOM are normal.  Neck: Normal range of motion.  Cardiovascular: Normal rate.   Pulmonary/Chest:  Mild SOB at rest, increased WOB with brief conversation. On NRB with 95% O2 saturation at rest.  Abdominal: Soft.  Musculoskeletal: Normal range of motion.  Neurological: She is alert and oriented to person, place, and time.  Skin: Skin is warm and dry. There is pallor.  Psychiatric: She has a normal mood and affect. Her behavior is normal. Judgment and thought content normal.   Vital Signs: BP (!) 89/63 (BP Location: Right Arm)   Pulse 96   Temp 98.6 F (37 C) (Axillary)   Resp 17   Ht 5' 2.5" (1.588 m)   Wt 65.6 kg (144 lb 9.6 oz)   SpO2 96%   BMI 26.03 kg/m  Pain Assessment: No/denies pain POSS *See Group Information*: S-Acceptable,Sleep, easy to arouse Pain Score: 0-No pain   SpO2: SpO2: 96 % O2 Device:SpO2: 96 % O2 Flow Rate: .O2 Flow Rate (L/min): 15 L/min  IO: Intake/output summary:  Intake/Output Summary (Last 24 hours) at 07/29/16 4917 Last data filed at  07/29/16 0654  Gross per 24 hour  Intake              610 ml  Output             1950 ml  Net            -1340 ml    LBM: Last BM Date: 07/28/16 Baseline Weight: Weight: 69.6 kg (153 lb 7 oz) Most recent weight: Weight: 65.6 kg (144 lb 9.6 oz)     Palliative Assessment/Data: PPS 30-40%, r/t respiratory status   Flowsheet Rows   Flowsheet Row Most Recent Value  Intake Tab  Referral Department  Hospitalist  Unit at Time of Referral  Cardiac/Telemetry Unit  Palliative Care Primary Diagnosis  Pulmonary  Date Notified  07/28/16  Palliative Care Type  New Palliative care  Reason for referral  Clarify Goals of Care  Date of Admission  07/20/16  Date first seen by Palliative Care  07/28/16  # of days Palliative referral response time  0 Day(s)  # of days IP prior to Palliative referral  8  Clinical Assessment  Palliative Performance Scale Score  30%  Dyspnea Max Last 24 Hours  6  Dyspnea Min Last 24 hours  3  Psychosocial & Spiritual Assessment  Palliative Care Outcomes  Patient/Family meeting held?  Yes  Who was at the meeting?  patient and husband  Palliative Care Outcomes  Clarified goals of care      Time in/Out: 0930/1030; 12:30/1330  Time Total: 160 minutes Greater than 50%  of this time was spent counseling and coordinating care related to the above assessment and plan.  Signed by: Charlynn Court, NP Palliative Medicine Team Pager # [phone] (M-F 7a-5p) Team Phone # [phone] (Nights/Weekends)

## 2016-07-29 NOTE — Progress Notes (Signed)
Occupational Therapy Treatment Patient Details Name: Kristina Schroeder MRN: [id number] DOB: 1940/11/23 Today's Date: 07/29/2016    History of present illness Kristina Schroeder is a 76 y.o. woman with a history of interstitial lung disease/pulmonary fibrosis causing chronic hypoxic respiratory failure (she is on 3L [place] at baseline), breast cancer, chronic LBBB, DM, GERD, dyslipidemia, and fibromyalgia who was in her baseline state of health  when she started feeling light-headed and called out to her husband.  He got to her in time to break her fall as she was having a syncopal episode in the doorway to the bathroom.  Her husband was able to get her to the ground, and he reports that she was unconscious for 10 minutes before she came to. When EMS personnel attempted to have her sit up, she had another syncopal episode, but she was only unconscious for 1-2 minutes the second time.     OT comments  Pt required max encouragement to participate in any mobility. At rest BP 124/83, O2 93% and HR 60. Pt agreeable to sit EOB and standing at EOB after max encouragement.Pt sat EOB x 5 minutes with O2 decreasing to 88-90%. Pt stood with min A at EOB, O2 decreased to 84% standing 20 seconds. Pt then returned to sitting and O2 recovered 88-90% within seconds. Pt on 15L O2. Educated pt and family on benefits of as much activity as tolerated for ADLs. OT will continue to follow acutely  Follow Up Recommendations  Home health OT;Supervision/Assistance - 24 hour    Equipment Recommendations  3 in 1 bedside commode    Recommendations for Other Services      Precautions / Restrictions Precautions Precautions: Other (comment);Fall Precaution Comments: monitor o2 sats Restrictions Weight Bearing Restrictions: No       Mobility Bed Mobility Overal bed mobility: Needs Assistance Bed Mobility: Supine to Sit;Sit to Supine     Supine to sit: Min guard Sit to supine: Min assist   General bed mobility comments: Pt  rerquired max encouragement to sit EOB. min guard A to EOB and for LEs back onto bed. O2 decreased 88-90% EOB  Transfers                 General transfer comment: Pt required max encouragment to attempt standing. Pt stood with min A at EOB, O2 decreased to 84% standing 20 seconds. Pt then returned to sitting and O2 recovered 88-90% within seconds. Pt on 15L O2    Balance Overall balance assessment: History of Falls;Needs assistance Sitting-balance support: Feet supported;No upper extremity supported Sitting balance-Leahy Scale: Good     Standing balance support: Single extremity supported;Bilateral upper extremity supported Standing balance-Leahy Scale: Poor                     ADL Overall ADL's : Needs assistance/impaired     Grooming: Set up;Supervision/safety;Sitting;Wash/dry hands;Wash/dry face   Upper Body Bathing: Min guard;Sitting (simulated)   Lower Body Bathing: Maximal assistance (simulated)             Toilet Transfer Details (indicate cue type and reason): pt only able to stand x 20 second sat EOB and O2 SATs deceasing. Feel pt would have been able to SPT to [facility]           General ADL Comments: Pt limited to sitting EOB and bed level activities this session due to SpO2 down to 84% stadning x 20 seconds  Cognition   Behavior During Therapy: WFL for tasks assessed/performed Overall Cognitive Status: Within Functional Limits for tasks assessed                                     General Comments  pt pleasant    Pertinent Vitals/ Pain       Pain Assessment: No/denies pain                                                          Frequency  Min 2X/week        Progress Toward Goals  OT Goals(current goals can now be found in the care plan section)  Progress towards OT goals: OT to reassess next treatment  Acute Rehab OT Goals Patient Stated  Goal: "find out if I will can get better" OT Goal Formulation: With patient/family  Plan Discharge plan remains appropriate                     End of Session Equipment Utilized During Treatment: Oxygen;Gait belt  OT Visit Diagnosis: History of falling (Z91.81);Muscle weakness (generalized) (M62.81)   Activity Tolerance Patient limited by fatigue;Treatment limited secondary to medical complications (Comment)   Patient Left in bed;with call bell/phone within reach;with family/visitor present   Nurse Communication          Time: [id number] OT Time Calculation (min): 18 min  Charges: OT General Charges $OT Visit: 1 Procedure OT Treatments $Therapeutic Activity: 8-22 mins     Britt Bottom 07/29/2016, 12:51 PM

## 2016-07-29 NOTE — Progress Notes (Signed)
Progress Note    Kristina Schroeder  WFU:[id number] DOB: Jun 06, 1941  DOA: 07/20/2016 PCP: Cari Caraway, MD    Brief Narrative:   Chief complaint: Follow-up syncope  Kristina Schroeder is an 76 y.o. female with a PMH of interstitial lung disease, pulmonary fibrosis, chronic hypoxic respiratory failure on 3 L of home oxygen, diabetes, breast cancer, chronic left bundle branch block, dyslipidemia and fibromyalgia who was admitted 07/20/16 for evaluation of 2 syncopal events. She was noted to be orthostatic upon evaluation by EMS and received 1 L of normal saline. She also had an increased oxygen requirement requiring a nonrebreather mask to maintain her oxygen saturations. She saw her pulmonologist 07/19/16 and had a high resolution CT scan 07/18/16 which showed patchy irregular areas of consolidation and groundglass opacities throughout the lungs, some of which are improved and some worsened compared to previous CT scan. Underlying pulmonary fibrosis and multiple attenuation lesions in the liver again identified. Recommendations were to start a slow prednisone taper.  Assessment/Plan:      Syncope and collapse/Orthostatic hypotension Noted to be orthostatic on presentation. Hydrated with IV hydration. Worsening hypoxia may be contributory Currently hemodynamically stable. 2-D echo shows EF of 55-60%, moderate pulmonary hypertension. Continue intermittent low-dose Lasix, to help with weaning oxygen .    Acute on chronic respiratory failure in the setting of interstitial lung disease and pulmonary fibrosis/moderate pulmonary hypertension S/P Transbronchial Biopsy in 05/31/16 showing Focal Interstitial Fibrosis CTD work up (-) except for elevated C4, mildly elevated aldolase. Lymphocytic predominance in BAL (05/2016) Infiltrates "waxing and waning" with serial Chest CT scan) Chest x-ray done 07/20/16  patchy bilateral infiltrates apparent.   Respiratory virus panel negative.  S/P Pulse dose medrol, 3  days finished on 2/15 with 1 g/day.  Plan to decrease dose to 125 mg q6 (from 250 mgq6) Medrol for 24 hrs then decrease to 60 mg q 6 x 24 then 30 mg q 6 x 24 hrs then 30 mg q12 x 24hrs then switch to 40 mg of prednisone. Dopplers negative for DVT. 2-D echo showed moderate pulmonary hypertension, EF 55-60%. Remains on high flow oxygen.   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 F/U  for myositis. (anti Jo negative, aldolase slightly elevated, ANA   negative ). CKs were normal. ESR and CRP very elevated Cont with NRBM, keep o2 sats > 88%. Trial with HFNC or pendant oximizer Continue steroid taper Will be stable for discharge , once oxygen requirements less than 10 L Palliative care consultation for goals of care/hospice eligibility [prognosis cannot be determined at this point], likely poor  requested PCCM to reach up to husband 2/19      Left bundle branch hemiblock Monitoring on telemetry.   GERD (gastroesophageal reflux disease) Continue Protonix.   Type 2 diabetes mellitus uncontrolled, with long-term current use of insulin (HCC) and stage III CKD Continue NovoLog 8 units and  Lantus to 35 units given high-dose steroids. May need to be more aggressive with insulin therapy given high-dose steroids. Hemoglobin A1c 11.3% indicating poor control. Diabetes coordinator following. 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    Hyperlipidemia Continue Crestor and Zetia.    Hypokalemia Repleted.    UTI Urine culture growing gram-negative rods. Empiric ceftriaxone continue. DC Rocephin 2/19   History of breast cancer Recent MRI of the abdomen consistent with liver findings, peripherally enhancing liver lesions which  are worrisome for metastatic disease Cannot r/o lymphangitic spread, willl verify with pulmonary Discussed with husband and daughter Will not be able to tolerate  liver bx or any further invasive workup   Family Communication/Anticipated D/C date and plan/Code Status   DVT prophylaxis: Lovenox ordered. Code Status: Full Code.  Family Communication:  Daughter/grandson/husband  updated Disposition Plan: Home when stable From a pulmonary standpoint, likely several more days.   Medical Consultants:    Pulmonology  Palliative care   Procedures:    2 D Echo 07/22/16  Study Conclusions  - Left ventricle: The cavity size was normal. Systolic function was   normal. The estimated ejection fraction was in the range of 55%   to 60%. Images were inadequate for LV wall motion assessment. The   study is not technically sufficient to allow evaluation of LV   diastolic function. - Mitral valve: There was mild regurgitation. - Right ventricle: The cavity size was mildly dilated. Wall   thickness was normal. Systolic function was mildly reduced. - Tricuspid valve: There was mild regurgitation. - Pulmonary arteries: PA peak pressure: 60 mm Hg (S).  Impressions:  - The right ventricular systolic pressure was increased consistent   with moderate pulmonary hypertension.   Bilateral lower extremity venous duplex   Preliminary report:  There is no DVT or SVT noted in the bilateral lower extremities.   Anti-Infectives:    Rocephin 07/22/16--->  Subjective:    she continues to be short of breath. Husband by the bedside   Objective:    Vitals:   07/28/16 2015 07/29/16 0025 07/29/16 0437 07/29/16 0752  BP: 118/88 113/80 115/70 (!) 89/63  Pulse: 88 86 89 96  Resp: 17 14 17 17   Temp: 98.3 F (36.8 C) 98.2 F (36.8 C) 97.6 F (36.4 C) 98.6 F (37 C)  TempSrc:  Axillary  Axillary  SpO2: 94% 95% 94% 96%  Weight:   65.6 kg (144 lb 9.6 oz)   Height:        Intake/Output Summary (Last 24 hours) at 07/29/16 0826 Last data filed at 07/29/16 0654  Gross per 24 hour  Intake              610 ml  Output             1950 ml  Net             -1340 ml   Filed Weights   07/27/16 0500 07/28/16 0332 07/29/16 0437  Weight: 64.5 kg (142 lb 4.8 oz) 66.5 kg (146 lb 9.6 oz) 65.6 kg (144 lb 9.6 oz)    Exam: General:  NAD, well nourished. Comfortable.  Neuro:  CN grossly intact. (-) lateralizing signs.  HEENT:  NCAT, MM dry Cardiovascular:  RRR, s1/s2 no m/r/g.  Trace to 1+ pitting edema b/l LE Lungs:  Crackles mid-bases.  no wheezing/rhonchi.  (-) accessory muscle use.  Abdomen:  Soft, non-tender, normal bowel sounds Musculoskeletal:  Normal bulk and tone, joints normal Skin:  No rashes  Data Reviewed:   I have personally reviewed following labs and imaging studies:  Labs: Basic Metabolic Panel:  Recent Labs Lab 07/24/16 0248 07/25/16 0400 07/27/16 0513 07/27/16 1122 07/28/16 0353  NA 136 139 136  --  137  K 4.2 3.6 3.0*  --  4.8  CL 102 102 93*  --  98*  CO2 25 27 33*  --  30  GLUCOSE 230* 163* 192*  --  154*  BUN 9 10 12   --  13  CREATININE 0.49 0.45 0.46  --  0.43*  CALCIUM 8.6* 8.4* 8.3*  --  8.5*  MG 2.1  --   --  2.3  --   PHOS 2.0*  --   --   --   --    GFR Estimated Creatinine Clearance: 54.7 mL/min (by C-G formula based on SCr of 0.43 mg/dL (L)). Liver Function Tests:  Recent Labs Lab 07/24/16 0248 07/25/16 0400 07/28/16 0353  AST  --  19 22  ALT  --  19 23  ALKPHOS  --  104 107  BILITOT  --  0.6 0.6  PROT  --  5.6* 5.9*  ALBUMIN 2.4* 2.5* 2.6*   CBC:  Recent Labs Lab 07/24/16 0248 07/25/16 0400  WBC 13.8* 11.7*  HGB 13.0 12.8  HCT 39.3 39.5  MCV 84.9 85.3  PLT 215 232   CBG:  Recent Labs Lab 07/28/16 0734 07/28/16 1138 07/28/16 1613 07/28/16 2132 07/29/16 0750  GLUCAP 160* 271* 265* 195* 102*   Sepsis Labs:  Recent Labs Lab 07/24/16 0248 07/25/16 0400  WBC 13.8* 11.7*    Microbiology Recent Results (from the past 240 hour(s))  Respiratory Panel by PCR     Status: None   Collection Time: 07/20/16  6:56 PM  Result Value Ref Range Status   Adenovirus NOT  DETECTED NOT DETECTED Final   Coronavirus 229E NOT DETECTED NOT DETECTED Final   Coronavirus HKU1 NOT DETECTED NOT DETECTED Final   Coronavirus NL63 NOT DETECTED NOT DETECTED Final   Coronavirus OC43 NOT DETECTED NOT DETECTED Final   Metapneumovirus NOT DETECTED NOT DETECTED Final   Rhinovirus / Enterovirus NOT DETECTED NOT DETECTED Final   Influenza A NOT DETECTED NOT DETECTED Final   Influenza B NOT DETECTED NOT DETECTED Final   Parainfluenza Virus 1 NOT DETECTED NOT DETECTED Final   Parainfluenza Virus 2 NOT DETECTED NOT DETECTED Final   Parainfluenza Virus 3 NOT DETECTED NOT DETECTED Final   Parainfluenza Virus 4 NOT DETECTED NOT DETECTED Final   Respiratory Syncytial Virus NOT DETECTED NOT DETECTED Final   Bordetella pertussis NOT DETECTED NOT DETECTED Final   Chlamydophila pneumoniae NOT DETECTED NOT DETECTED Final   Mycoplasma pneumoniae NOT DETECTED NOT DETECTED Final  Culture, Urine     Status: Abnormal   Collection Time: 07/20/16  7:03 PM  Result Value Ref Range Status   Specimen Description URINE, RANDOM  Final   Special Requests ADDED AT 0256 ON 102111  Final   Culture >=100,000 COLONIES/mL ESCHERICHIA COLI (A)  Final   Report Status 07/23/2016 FINAL  Final   Organism ID, Bacteria ESCHERICHIA COLI (A)  Final      Susceptibility   Escherichia coli - MIC*    AMPICILLIN >=32 RESISTANT Resistant     CEFAZOLIN 32 INTERMEDIATE Intermediate     CEFTRIAXONE <=1 SENSITIVE Sensitive     CIPROFLOXACIN <=0.25 SENSITIVE Sensitive     GENTAMICIN <=1 SENSITIVE Sensitive     IMIPENEM <=0.25 SENSITIVE Sensitive     NITROFURANTOIN <=16 SENSITIVE Sensitive     TRIMETH/SULFA <=20 SENSITIVE Sensitive     AMPICILLIN/SULBACTAM >=32 RESISTANT Resistant     PIP/TAZO <=4 SENSITIVE Sensitive     Extended ESBL NEGATIVE Sensitive     * >=100,000 COLONIES/mL ESCHERICHIA COLI  MRSA PCR Screening     Status: None   Collection Time: 07/21/16  8:52 AM  Result Value Ref Range Status   MRSA by PCR  NEGATIVE NEGATIVE Final    Comment:  The GeneXpert MRSA Assay (FDA approved for NASAL specimens only), is one component of a comprehensive MRSA colonization surveillance program. It is not intended to diagnose MRSA infection nor to guide or monitor treatment for MRSA infections.     Radiology: No results found.  Medications:   . aspirin EC  81 mg Oral QODAY  . cefTRIAXone (ROCEPHIN)  IV  1 g Intravenous Q24H  . diltiazem  360 mg Oral Daily  . DULoxetine  60 mg Oral BID  . enoxaparin (LOVENOX) injection  40 mg Subcutaneous Q24H  . ezetimibe  10 mg Oral QHS  . guaiFENesin  600 mg Oral BID  . insulin aspart  0-20 Units Subcutaneous TID WC  . insulin aspart  0-5 Units Subcutaneous QHS  . insulin aspart  8 Units Subcutaneous TID WC  . insulin glargine  35 Units Subcutaneous QHS  . loratadine  10 mg Oral Daily  . mouth rinse  15 mL Mouth Rinse BID  . nadolol  20 mg Oral Daily  . nystatin  5 mL Oral QID  . pantoprazole  40 mg Oral BID AC  . pneumococcal 23 valent vaccine  0.5 mL Intramuscular Tomorrow-1000  . predniSONE  40 mg Oral Q breakfast  . psyllium  1 packet Oral BID  . rosuvastatin  5 mg Oral QHS  . sodium chloride flush  3 mL Intravenous Q12H   Continuous Infusions: . sodium chloride 10 mL/hr at 07/24/16 0700    Medical decision making is of high complexity and this patient is at high risk of deterioration, therefore this is a level 3 visit.  (> 4 problem points, >4 data points, high risk)   LOS: 8 days   [facility] Pager [phone]. If unable to reach me by pager, please call my cell phone at [phone].  *Please refer to [email or web address], password [email or web address] to get updated schedule on who will round on this patient, as hospitalists switch teams weekly. If 7PM-7AM, please contact night-coverage at [email or web address], password [email or web address] for any overnight needs.  07/29/2016, 8:26 AM

## 2016-07-29 NOTE — Progress Notes (Signed)
PT Cancellation Note  Patient Details Name: Kristina Schroeder MRN: [id number] DOB: May 11, 1941   Cancelled Treatment:    Reason Eval/Treat Not Completed: Other (comment) (pt just finished with OT and fatigued, will attempt later today)   Dryden Tapley B Autumn Pruitt 07/29/2016, 10:32 AM  Elwyn Reach, [place]

## 2016-07-30 LAB — BASIC METABOLIC PANEL
ANION GAP: 8 (ref 5–15)
BUN: 12 mg/dL (ref 6–20)
CALCIUM: 8.4 mg/dL — AB (ref 8.9–10.3)
CO2: 30 mmol/L (ref 22–32)
Chloride: 98 mmol/L — ABNORMAL LOW (ref 101–111)
Creatinine, Ser: 0.39 mg/dL — ABNORMAL LOW (ref 0.44–1.00)
GFR calc Af Amer: >60 mL/min (ref >60)
GLUCOSE: 59 mg/dL — AB (ref 65–99)
Potassium: 4 mmol/L (ref 3.5–5.1)
Sodium: 136 mmol/L (ref 135–145)

## 2016-07-30 LAB — GLUCOSE, CAPILLARY
GLUCOSE-CAPILLARY: 123 mg/dL — AB (ref 65–99)
Glucose-Capillary: 167 mg/dL — ABNORMAL HIGH (ref 65–99)
Glucose-Capillary: 143 mg/dL — ABNORMAL HIGH (ref 65–99)

## 2016-07-30 MED ORDER — INSULIN GLARGINE 300 UNIT/ML ~~LOC~~ SOPN: 20.0000 [IU] | PEN_INJECTOR | Freq: Every day | SUBCUTANEOUS | 12 refills | Status: AC

## 2016-07-30 MED ORDER — NYSTATIN 100000 UNIT/ML MT SUSP: 5.0000 mL | Freq: Four times a day (QID) | OROMUCOSAL | 1 refills | Status: AC

## 2016-07-30 MED ORDER — LORAZEPAM 2 MG/ML IJ SOLN
0.5000 mg | Freq: Once | INTRAMUSCULAR | Status: AC
  Administered 2016-07-30: 0.5 mg via INTRAVENOUS

## 2016-07-30 MED ORDER — PREDNISONE 20 MG PO TABS: 40.0000 mg | ORAL_TABLET | Freq: Every day | ORAL | 1 refills | Status: AC

## 2016-07-30 MED ORDER — DILTIAZEM HCL ER COATED BEADS 360 MG PO CP24: 360.0000 mg | ORAL_CAPSULE | Freq: Every day | ORAL | 0 refills | Status: AC

## 2016-07-30 MED ORDER — LORAZEPAM 2 MG/ML IJ SOLN
INTRAMUSCULAR | Status: AC
  Administered 2016-07-30: 0.5 mg via INTRAVENOUS
  Filled 2016-07-30: qty 1

## 2016-07-30 MED ORDER — LORAZEPAM 2 MG/ML IJ SOLN: 0.5000 mg | INTRAMUSCULAR | Status: DC | PRN

## 2016-07-30 MED ORDER — MORPHINE SULFATE (CONCENTRATE) 10 MG/0.5ML PO SOLN: 5.0000 mg | ORAL | 0 refills | Status: AC | PRN

## 2016-07-30 NOTE — Progress Notes (Signed)
Daily Progress Note   Patient Name: Kristina Schroeder       Date: 07/30/2016 DOB: 1940-09-28  Age: 76 y.o. MRN#: [id number] Attending Physician: Reyne Dumas, MD Primary Care Physician: Cari Caraway, MD Admit Date: 07/20/2016  Reason for Consultation/Follow-up: Non pain symptom management, Psychosocial/spiritual support and Terminal Care  Subjective: Kristina Schroeder is similar to yesterday in her breathing status. She continues to feel comfortable with her decision for comfort care and Hospice, however is having increasing anxiety about dying and leaving her family and friends. She was very tearful today discussing her fears and concerns, but was also very reflective of her good life and legacy.  Length of Stay: 9  Current Medications: Scheduled Meds:  . aspirin EC  81 mg Oral QODAY  . diltiazem  360 mg Oral Daily  . DULoxetine  60 mg Oral BID  . enoxaparin (LOVENOX) injection  40 mg Subcutaneous Q24H  . ezetimibe  10 mg Oral QHS  . guaiFENesin  600 mg Oral BID  . insulin aspart  0-20 Units Subcutaneous TID WC  . insulin aspart  0-5 Units Subcutaneous QHS  . insulin aspart  8 Units Subcutaneous TID WC  . insulin glargine  35 Units Subcutaneous QHS  . loratadine  10 mg Oral Daily  . mouth rinse  15 mL Mouth Rinse BID  . nadolol  20 mg Oral Daily  . nystatin  5 mL Oral QID  . pantoprazole  40 mg Oral BID AC  . pneumococcal 23 valent vaccine  0.5 mL Intramuscular Tomorrow-1000  . predniSONE  40 mg Oral Q breakfast  . psyllium  1 packet Oral BID  . rosuvastatin  5 mg Oral QHS  . sodium chloride flush  3 mL Intravenous Q12H    Continuous Infusions: . sodium chloride 10 mL/hr at 07/24/16 0700    PRN Meds: acetaminophen **OR** acetaminophen, baclofen, lip balm, ondansetron **OR** ondansetron (ZOFRAN) IV, traMADol, zolpidem  Physical Exam      Constitutional: She is oriented to person, place, and time. She appears well-developed and well-nourished.  Appropriate situational anxiety.  HENT:  Head: Normocephalic and atraumatic.  Mouth/Throat: Oropharynx is clear and moist. No oropharyngeal exudate.  Eyes: EOM are normal.  Neck: Normal range of motion.  Cardiovascular: Normal rate.   Pulmonary/Chest:  Mild SOB at rest, increased WOB with brief conversation. On NRB with 94% O2 saturation at rest.  Abdominal: Soft.  Musculoskeletal: Normal range of motion.  Neurological: She is alert and oriented to person, place, and time.  Skin: Skin is warm and dry. There is pallor.  Psychiatric: She has a normal mood and affect. Her behavior is normal. Judgment and thought content normal.       Vital Signs: BP (!) 132/91   Pulse 100   Temp 98 F (36.7 C) (Axillary)   Resp (!) 22   Ht 5' 2.5" (1.588 m)   Wt 65.1 kg (143 lb 9.6 oz)   SpO2 96%   BMI 25.85 kg/m  SpO2: SpO2: 96 % O2 Device: O2 Device: NRB O2 Flow Rate: O2 Flow Rate (L/min): 15 L/min  Intake/output summary:  Intake/Output Summary (Last 24 hours) at 07/30/16 1239 Last data filed at 07/30/16 0500  Gross per 24 hour  Intake              960 ml  Output              700 ml  Net              260 ml   LBM: Last BM Date: 07/28/16 Baseline Weight: Weight: 69.6 kg (153 lb 7 oz) Most recent weight: Weight: 65.1 kg (143 lb 9.6 oz)  Palliative Assessment/Data: PPS 30-40% r/t respiratory status   Flowsheet Rows   Flowsheet Row Most Recent Value  Intake Tab  Referral Department  Hospitalist  Unit at Time of Referral  Cardiac/Telemetry Unit  Palliative Care Primary Diagnosis  Pulmonary  Date Notified  07/28/16  Palliative Care Type  New Palliative care  Reason for referral  Clarify Goals of Care  Date of Admission  07/20/16  Date first seen by Palliative Care  07/28/16  # of days Palliative referral response time  0 Day(s)  # of days IP prior to Palliative referral  8   Clinical Assessment  Palliative Performance Scale Score  30%  Dyspnea Max Last 24 Hours  6  Dyspnea Min Last 24 hours  3  Psychosocial & Spiritual Assessment  Palliative Care Outcomes  Patient/Family meeting held?  Yes  Who was at the meeting?  patient and husband  Palliative Care Outcomes  Clarified goals of care      Patient Active Problem List   Diagnosis Date Noted  . Goals of care, counseling/discussion   . Palliative care by specialist   . Difficulty in walking, not elsewhere classified   . Urinary tract infection 07/23/2016  . Pulmonary hypertension 07/23/2016  . Uncontrolled diabetes mellitus with stage 3 chronic kidney disease, with long-term current use of insulin ([place]) 07/23/2016  . Hypokalemia 07/22/2016  . Respiratory failure ([place]) 07/20/2016  . Syncope and collapse 07/20/2016  . Orthostatic hypotension 07/20/2016  . ILD (interstitial lung disease) ([place]) 07/10/2016  . Hypoxia 05/29/2016  . Acute on chronic respiratory failure with hypoxemia ([place]) 05/29/2016  . Bronchitis 03/02/2012  . Sinusitis acute 10/31/2011  . Hyperlipidemia 10/15/2011  . Fibromyalgia 10/15/2011  . IBS (irritable bowel syndrome) 10/15/2011  . GERD (gastroesophageal reflux disease) 10/15/2011  . Inappropriate sinus node tachycardia 09/26/2008  . CARCINOMA IN SITU OF BREAST 09/24/2008  . Left bundle branch hemiblock 09/24/2008  . LEFT VENTRICULAR FUNCTION, DECREASED 09/24/2008    Palliative Care Assessment & Plan   HPI: 76 y.o. female  with past medical history of newly diagnosised interstitial lung disease, pulmonary fibrosis, chronic hypoxic respiratory failure on 3L O2, uncontrolled DM, breast cancer (2003 s/p lumpectomy, chemo, and XRT), LBBB, HLD, and fibromyalgia who presented to the ED after 2 syncopal episodes, and was subsequently admitted on 07/20/2016 with severe idiopathic pulmonary fibrosis exacerbation. She remains on NRB and desaturated with eating or taking medication.   Of  note, CT chest done 12/20 to evaluate new hypoxia noted multiple liver lesions, concerning for potential metastatic disease. Biopsy of a liver lesion and PET scan was working to be arranged PTA.   Assessment: I met with Kristina Schroeder, her husband, and her daughter on 2/19. Critical Care was present during the beginning of our meeting and gave insight into her pulmonary disease process, considerations for prognosis, and helped answer numerous questions. After their departure I had a long talk with Mrs. Markwardt and her husband that explored her understanding of what was said and her goals of care. She  ultimately felt Hospice was the best approach to supporting her, and I recommended residential placement given her significant respiratory symptom burden and potential for acute decline. She was amenable to this recommendation and [facility] was contacted by social work for referral.   On evaluation today she was felt to meet residential Hospice criteria. Education and information was provided to the patient and her family by the liaison for the facility. I follow-up to provide emotional support, and well as to evaluate her symptom burden. Her biggest issue today is increasing anxiety as she approaches the end of her life. She is sad about leaving her family, and working through the knowledge that her time is limited with them. I spent a long time at her bedside helping her explore these things, as well as reflecting on her many moments of joy throughout her life, and the impact she has had on so many people. We also discussed the grieving process and I encouraged her to reach for the things that bring her comfort and recognize that it is normal and okay to feel the things she is feeling. She was appreciative of our conversation and was thankful for the PRN anxiety medication to help get her through the especially hard moments.   Recommendations/Plan:  DNR, plan to transition to residential Hospice  today  I would continue to recommend steroids after discharge, per PCCM taper plan  Continue PRN ativan for anxiety  Goals of Care and Additional Recommendations:  Continue supportive care, plan to transition to full comfort care at discharge to residential hospice  Code Status:  DNR  Prognosis:   < 4 weeks. With her IPF flare unresponsive to steroids there is a 50% mortality at 30 days. Mrs. Gajda has not had any significant respiratory improvement and remains on NRB face mask. She also has liver metastases from an unknown primary. Given her lack of improvement, ongoing energy requirement from increased WOB, and oral intake limited by her respiratory status, I would expect her to deteriorate with a prognosis of less than a month.   Discharge Planning:  Hospice facility  Care plan was discussed with pt, husband, charge nurse.   Thank you for allowing the Palliative Medicine Team to assist in the care of this patient.  Time in/out: 1240/1250; 1440/1540  Total time: 90 minutes    Greater than 50%  of this time was spent counseling and coordinating care related to the above assessment and plan.  Charlynn Court, NP Palliative Medicine Team [phone] pager (7a-5p) Team Phone # [phone]

## 2016-07-30 NOTE — Discharge Summary (Addendum)
Physician Discharge Summary  Kristina Schroeder MRN: [id number] DOB/AGE: 1940-07-06 76 y.o.  PCP: Cari Caraway, MD   Admit date: 07/20/2016 Discharge date: 07/30/2016  Discharge Diagnoses:    Principal Problem:   Syncope and collapse Active Problems:   Left bundle branch hemiblock   GERD (gastroesophageal reflux disease)   Acute on chronic respiratory failure with hypoxemia (HCC)   ILD (interstitial lung disease) (HCC)   Respiratory failure (HCC)   Orthostatic hypotension   Hypokalemia   Urinary tract infection   Pulmonary hypertension   Uncontrolled diabetes mellitus with stage 3 chronic kidney disease, with long-term current use of insulin (HCC)   Difficulty in walking, not elsewhere classified   Goals of care, counseling/discussion   Palliative care by specialist   Patient being discharged home with [facility]  DME services provided Patient will 15L of oxygen ,high flow [place]      Current Discharge Medication List    START taking these medications   Details  Morphine Sulfate (MORPHINE CONCENTRATE) 10 MG/0.5ML SOLN concentrated solution Take 0.25 mLs (5 mg total) by mouth every 2 (two) hours as needed for severe pain. Qty: 180 mL, Refills: 0    nystatin (MYCOSTATIN) 100000 UNIT/ML suspension Take 5 mLs (500,000 Units total) by mouth 4 (four) times daily. Qty: 250 mL, Refills: 1      CONTINUE these medications which have CHANGED   Details  diltiazem (CARDIZEM CD) 360 MG 24 hr capsule Take 1 capsule (360 mg total) by mouth daily. Qty: 30 capsule, Refills: 0    Insulin Glargine (TOUJEO SOLOSTAR) 300 UNIT/ML SOPN Inject 20 Units into the skin at bedtime. Qty: 1 pen, Refills: 12    predniSONE (DELTASONE) 20 MG tablet Take 2 tablets (40 mg total) by mouth daily with breakfast. Qty: 60 tablet, Refills: 1      CONTINUE these medications which have NOT CHANGED   Details  acetaminophen (TYLENOL) 500 MG tablet Take 500-1,000 mg by mouth every 6 (six) hours as  needed for headache (or pain).    aspirin EC 81 MG tablet Take 81 mg by mouth every other day.    baclofen (LIORESAL) 10 MG tablet Take 10 mg by mouth 2 (two) times daily as needed for muscle spasms.     BIOTIN 5000 PO Take 5,000 mg by mouth daily.     cetirizine (ZYRTEC) 10 MG tablet Take 10 mg by mouth daily.    cholecalciferol (VITAMIN D) 1000 UNITS tablet Take 2,000 Units by mouth daily.      DULoxetine (CYMBALTA) 60 MG capsule Take 1 capsule (60 mg total) by mouth 2 (two) times daily. Qty: 60 capsule, Refills: 6    ezetimibe (ZETIA) 10 MG tablet Take 1 tablet (10 mg total) by mouth daily. Qty: 30 tablet, Refills: 11    furosemide (LASIX) 20 MG tablet Take 1 tablet (20 mg total) by mouth daily as needed for fluid or edema. For weight gain of >3Lbs in 1 day. Qty: 30 tablet, Refills: 0    guaiFENesin (MUCINEX) 600 MG 12 hr tablet Take 1 tablet (600 mg total) by mouth 2 (two) times daily. Qty: 30 tablet, Refills: 0    nadolol (CORGARD) 20 MG tablet Take 1 tablet (20 mg total) by mouth daily. Qty: 30 tablet, Refills: 3    pantoprazole (PROTONIX) 40 MG tablet Take 1 tablet (40 mg total) by mouth 2 (two) times daily before a meal. Qty: 60 tablet, Refills: 0    rosuvastatin (CRESTOR) 5 MG tablet Take  5 mg by mouth at bedtime.    traMADol (ULTRAM) 50 MG tablet Take 1 tablet (50 mg total) by mouth every 6 (six) hours as needed. Qty: 15 tablet, Refills: 0    zolpidem (AMBIEN) 10 MG tablet Take 0.5 tablets (5 mg total) by mouth at bedtime as needed. For sleep Qty: 30 tablet, Refills: 3      STOP taking these medications     glimepiride (AMARYL) 2 MG tablet      Wheat Dextrin (BENEFIBER) POWD          Discharge Condition: Prognosis guarded, less than one month        Allergies  Allergen Reactions  . Demerol Nausea And Vomiting       . Hydrocodone-Acetaminophen Nausea And Vomiting       . Meperidine Hcl Nausea And Vomiting       . Morphine And Related Nausea And  Vomiting        . Vioxx [Rofecoxib] Nausea And Vomiting             Disposition: Home with hospice   Consults:  Palliative care Critical care      Significant Diagnostic Studies:  Dg Chest 2 View  Result Date: 07/20/2016 CLINICAL DATA:  Hypoxemia EXAM: CHEST  2 VIEW COMPARISON:  07/18/2016 FINDINGS: Cardiac shadow is stable. Patchy infiltrative changes are again identified throughout both lungs stable from the recent CT examination. No new focal abnormality is seen. No bony abnormality is noted. IMPRESSION: Patchy bilateral infiltrates stable from the recent CT. Electronically Signed   By: Inez Catalina M.D.   On: 07/20/2016 19:52   Ct Chest High Resolution  Result Date: 07/18/2016 CLINICAL DATA:  76 year old female with a history of left breast cancer in 2003 presents for follow-up of interstitial lung disease on prednisone. Patient also has numerous liver masses suspicious for metastatic disease per prior MRI evaluation. EXAM: CT CHEST WITHOUT CONTRAST TECHNIQUE: Multidetector CT imaging of the chest was performed following the standard protocol without intravenous contrast. High resolution imaging of the lungs, as well as inspiratory and expiratory imaging, was performed. COMPARISON:  06/19/2016 MRI abdomen. 05/29/2016 high-resolution chest CT. FINDINGS: Cardiovascular: Stable mild cardiomegaly. No significant pericardial fluid/thickening. Left anterior descending, left circumflex and right coronary atherosclerosis. Atherosclerotic nonaneurysmal thoracic aorta. Stable dilated main pulmonary artery (3.6 cm diameter). Mediastinum/Nodes: No discrete thyroid nodules. Unremarkable esophagus. No axillary adenopathy. Stable mild right paratracheal adenopathy measuring up to 1.1 cm (series 2/ image 56). Stable mildly enlarged 1.1 cm right subcarinal node (series 2/ image 74). Stable coarsely calcified right infrahilar nodes. No new pathologically enlarged mediastinal or gross hilar nodes on this  noncontrast scan. Lungs/Pleura: No pneumothorax. No pleural effusion. There are persistent extensive patchy irregular regions of consolidation and ground-glass attenuation throughout both lungs involving all lung lobes, generally associated with the peripheral peribronchovascular regions of the lungs, which demonstrate mixed interval changes since 05/29/2016 high-resolution chest CT. For example in the left upper lobe, the patchy ground-glass opacities have significantly decreased. However, several of the irregular regions of consolidation and nodular foci of ground-glass attenuation have increased in size and density. For example an irregular 3.6 x 2.8 cm peripheral right upper lobe focus of consolidation (series 3/ image 41), increased from 3.0 x 2.3 cm, and increased in density. In the left lower lobe, a nodular focus of ground-glass opacity measures 2.0 x 1.3 cm (series 3/image 60), previously 1.5 x 0.9 cm. These findings are superimposed on stable confluent patchy subpleural reticulation and ground-glass  attenuation with traction bronchiectasis and bronchiolectasis, which demonstrates a mild basilar predominance. No frank honeycombing. Upper abdomen: Simple 1.9 cm central left liver lobe cyst. Several additional low-attenuation masses scattered throughout the liver measuring up to 1.8 cm in the anterior left liver lobe are poorly delineated on this noncontrast study and not convincingly changed compared to the 05/29/2016 chest CT. Musculoskeletal: No aggressive appearing focal osseous lesions. Moderate thoracic spondylosis. Surgical clips are again noted in the lateral left breast. IMPRESSION: 1. Mixed interval changes. Persistent extensive patchy irregular peribronchovascular regions of consolidation and ground-glass attenuation throughout both lungs, some larger and increased in density and some decreased, which are superimposed on peripheral basilar predominant fibrotic changes. The differential for these  findings includes waxing and waning atypical inflammatory processes such as cryptogenic organizing pneumonia, vasculitis or eosinophilic pneumonia, with suspected underlying fibrotic interstitial lung disease such as nonspecific interstitial pneumonia (NSIP) or early usual interstitial pneumonia (UIP). Given the history of breast cancer and suspicious liver masses, the possibility of a component of metastatic pulmonary disease cannot be excluded. 2. Stable nonspecific mild mediastinal lymphadenopathy. 3. Low-attenuation liver masses scattered throughout the liver, poorly delineated on this noncontrast study, not convincingly changed, which remain suspicious for liver metastases as per 06/19/2016 MRI abdomen report. Ultrasound-guided liver mass biopsy should be considered. 4. Stable mild cardiomegaly. Aortic atherosclerosis. Three-vessel coronary atherosclerosis. Stable dilated main pulmonary artery, suggesting chronic pulmonary arterial hypertension. Electronically Signed   By: Ilona Sorrel M.D.   On: 07/18/2016 13:38   Dg Chest Port 1 View  Result Date: 07/24/2016 CLINICAL DATA:  Respiratory failure, shortness of Breath EXAM: PORTABLE CHEST 1 VIEW COMPARISON:  07/20/2016 FINDINGS: Severe diffuse bilateral airspace disease, most confluent in the right upper lobe and left lower lobe. Findings are stable since prior study. No visible effusions. No acute bony abnormality. IMPRESSION: Severe diffuse bilateral airspace disease, unchanged. Electronically Signed   By: Rolm Baptise M.D.   On: 07/24/2016 09:43        Filed Weights   07/28/16 0332 07/29/16 0437 07/30/16 0430  Weight: 66.5 kg (146 lb 9.6 oz) 65.6 kg (144 lb 9.6 oz) 65.1 kg (143 lb 9.6 oz)     Microbiology: Recent Results (from the past 240 hour(s))  Respiratory Panel by PCR     Status: None   Collection Time: 07/20/16  6:56 PM  Result Value Ref Range Status   Adenovirus NOT DETECTED NOT DETECTED Final   Coronavirus 229E NOT DETECTED NOT  DETECTED Final   Coronavirus HKU1 NOT DETECTED NOT DETECTED Final   Coronavirus NL63 NOT DETECTED NOT DETECTED Final   Coronavirus OC43 NOT DETECTED NOT DETECTED Final   Metapneumovirus NOT DETECTED NOT DETECTED Final   Rhinovirus / Enterovirus NOT DETECTED NOT DETECTED Final   Influenza A NOT DETECTED NOT DETECTED Final   Influenza B NOT DETECTED NOT DETECTED Final   Parainfluenza Virus 1 NOT DETECTED NOT DETECTED Final   Parainfluenza Virus 2 NOT DETECTED NOT DETECTED Final   Parainfluenza Virus 3 NOT DETECTED NOT DETECTED Final   Parainfluenza Virus 4 NOT DETECTED NOT DETECTED Final   Respiratory Syncytial Virus NOT DETECTED NOT DETECTED Final   Bordetella pertussis NOT DETECTED NOT DETECTED Final   Chlamydophila pneumoniae NOT DETECTED NOT DETECTED Final   Mycoplasma pneumoniae NOT DETECTED NOT DETECTED Final  Culture, Urine     Status: Abnormal   Collection Time: 07/20/16  7:03 PM  Result Value Ref Range Status   Specimen Description URINE, RANDOM  Final   Special  Requests ADDED AT 0256 ON [id number]  Final   Culture >=100,000 COLONIES/mL ESCHERICHIA COLI (A)  Final   Report Status 07/23/2016 FINAL  Final   Organism ID, Bacteria ESCHERICHIA COLI (A)  Final      Susceptibility   Escherichia coli - MIC*    AMPICILLIN >=32 RESISTANT Resistant     CEFAZOLIN 32 INTERMEDIATE Intermediate     CEFTRIAXONE <=1 SENSITIVE Sensitive     CIPROFLOXACIN <=0.25 SENSITIVE Sensitive     GENTAMICIN <=1 SENSITIVE Sensitive     IMIPENEM <=0.25 SENSITIVE Sensitive     NITROFURANTOIN <=16 SENSITIVE Sensitive     TRIMETH/SULFA <=20 SENSITIVE Sensitive     AMPICILLIN/SULBACTAM >=32 RESISTANT Resistant     PIP/TAZO <=4 SENSITIVE Sensitive     Extended ESBL NEGATIVE Sensitive     * >=100,000 COLONIES/mL ESCHERICHIA COLI  MRSA PCR Screening     Status: None   Collection Time: 07/21/16  8:52 AM  Result Value Ref Range Status   MRSA by PCR NEGATIVE NEGATIVE Final    Comment:        The GeneXpert MRSA  Assay (FDA approved for NASAL specimens only), is one component of a comprehensive MRSA colonization surveillance program. It is not intended to diagnose MRSA infection nor to guide or monitor treatment for MRSA infections.        Blood Culture    Component Value Date/Time   SDES URINE, RANDOM 07/20/2016 1903   SPECREQUEST ADDED AT [place] ON [id number] 07/20/2016 1903   CULT >=100,000 COLONIES/mL ESCHERICHIA COLI (A) 07/20/2016 1903   REPTSTATUS 07/23/2016 FINAL 07/20/2016 1903      Labs: Results for orders placed or performed during the hospital encounter of 07/20/16 (from the past 48 hour(s))  Glucose, capillary     Status: Abnormal   Collection Time: 07/28/16 11:38 AM  Result Value Ref Range   Glucose-Capillary 271 (H) 65 - 99 mg/dL  Glucose, capillary     Status: Abnormal   Collection Time: 07/28/16  4:13 PM  Result Value Ref Range   Glucose-Capillary 265 (H) 65 - 99 mg/dL  Glucose, capillary     Status: Abnormal   Collection Time: 07/28/16  9:32 PM  Result Value Ref Range   Glucose-Capillary 195 (H) 65 - 99 mg/dL  Glucose, capillary     Status: Abnormal   Collection Time: 07/29/16  7:50 AM  Result Value Ref Range   Glucose-Capillary 102 (H) 65 - 99 mg/dL  Glucose, capillary     Status: Abnormal   Collection Time: 07/29/16 11:27 AM  Result Value Ref Range   Glucose-Capillary 222 (H) 65 - 99 mg/dL  Glucose, capillary     Status: Abnormal   Collection Time: 07/29/16  5:14 PM  Result Value Ref Range   Glucose-Capillary 245 (H) 65 - 99 mg/dL   Comment 1 Document in Chart   Glucose, capillary     Status: Abnormal   Collection Time: 07/29/16  9:41 PM  Result Value Ref Range   Glucose-Capillary 193 (H) 65 - 99 mg/dL  Basic metabolic panel     Status: Abnormal   Collection Time: 07/30/16  5:00 AM  Result Value Ref Range   Sodium 136 135 - 145 mmol/L   Potassium 4.0 3.5 - 5.1 mmol/L   Chloride 98 (L) 101 - 111 mmol/L   CO2 30 22 - 32 mmol/L   Glucose, Bld 59 (L) 65  - 99 mg/dL   BUN 12 6 - 20 mg/dL   Creatinine, Ser 0.39 (  L) 0.44 - 1.00 mg/dL   Calcium 8.4 (L) 8.9 - 10.3 mg/dL   GFR calc non Af Amer >60 >60 mL/min   GFR calc Af Amer >60 >60 mL/min    Comment: (NOTE) The eGFR has been calculated using the CKD EPI equation. This calculation has not been validated in all clinical situations. eGFR's persistently <60 mL/min signify possible Chronic Kidney Disease.    Anion gap 8 5 - 15  Glucose, capillary     Status: Abnormal   Collection Time: 07/30/16  7:28 AM  Result Value Ref Range   Glucose-Capillary 123 (H) 65 - 99 mg/dL     Lipid Panel     Component Value Date/Time   CHOL 129 09/04/2011 0906   TRIG 119.0 09/04/2011 0906   HDL 45.50 09/04/2011 0906   CHOLHDL 3 09/04/2011 0906   VLDL 23.8 09/04/2011 0906   LDLCALC 60 09/04/2011 1829     Lab Results  Component Value Date   HGBA1C 11.3 (H) 07/22/2016       HPI :  Kristina Schroeder is an 76 y.o. female with a PMH of interstitial lung disease, pulmonary fibrosis, chronic hypoxic respiratory failure on 3 L of home oxygen, diabetes, breast cancer, chronic left bundle branch block, dyslipidemia and fibromyalgia who was admitted 07/20/16 for evaluation of 2 syncopal events. She was noted to be orthostatic upon evaluation by EMS and received 1 L of normal saline. She also had an increased oxygen requirement requiring a nonrebreather mask to maintain her oxygen saturations. She saw her pulmonologist 07/19/16 and had a high resolution CT scan 07/18/16 which showed patchy irregular areas of consolidation and groundglass opacities throughout the lungs, some of which are improved and some worsened compared to previous CT scan. Underlying pulmonary fibrosis and multiple attenuation lesions in the liver again identified. Recommendations were to start a slow prednisone taper. Of note, CT chest done 12/20 to evaluate new hypoxia noted multiple liver lesions, concerning for potential metastatic disease. Biopsy of a  liver lesion and PET scan were being attempted to be arranged. However patient ended up in the hospital with hypoxic respiratory failure  HOSPITAL COURSE:   Syncope and collapse/Orthostatic hypotension Noted to be orthostatic on presentation. Hydrated with IV hydration. Worsening hypoxia may be contributory Currently hemodynamically stable. 2-D echo shows EF of 55-60%, moderate pulmonary hypertension. Continue intermittent low-dose Lasix, to help with weaning oxygen .    Acute on chronic respiratory failure in the setting of interstitial lung disease and pulmonary fibrosis/moderate pulmonary hypertension S/P Transbronchial Biopsy in 05/31/16 showing Focal Interstitial Fibrosis CTD work up (-) except for elevated C4, mildly elevated aldolase.Lymphocytic predominance in BAL (05/2016) Infiltrates "waxing and waning" with serial Chest CT scan) Chest x-ray done 07/20/16  patchy bilateral infiltrates apparent.   Respiratory virus panel negative.  S/P Pulse dose medrol, 3 days finished on 2/15 with 1 g/day. Plan to decrease dose to 125 mg q6 (from 250 mgq6) Medrol for 24 hrs then decrease to 60 mg q 6 x 24 then 30 mg q 6 x 24 hrs then 30 mg q12 x 24hrs then switch to 40 mg of prednisone which will be continued. Dopplers negative for DVT. 2-D echo showed moderate pulmonary hypertension, EF 55-60%. Remains on high flow oxygen.   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  CKs were normal. ESR  and CRP very elevated Cont with NRBM, keep o2 sats >88%. Now switched to HFNC or pendant oximizer brought in by home health Continue maintenance dose steroid Pulmonary has determined that the surgical lung biopsy or a liver biopsy would be too risky Palliative care consultation for goals of care/hospice eligibility [prognosis cannot be  determined at this point], likely poor  given lack of improvement with steroids in 1 weeks, 30d mortality is 50%      Left bundle branch hemiblock Unchanged   GERD (gastroesophageal reflux disease) Continue Protonix.   Type 2 diabetes mellitus uncontrolled, with long-term current use of insulin (HCC) and stage III CKD Continue  Lantus to 20 units while the patient is on maintenance dose steroids.  . Hemoglobin A1c 11.3% indicating poor control.     Hyperlipidemia Continue Crestor and Zetia.    Hypokalemia Repleted.    UTI Urine culture growing gram-negative rods. Empiric ceftriaxone continue. Completed Rocephin 2/19   History of breast cancer Recent MRI of the abdomen consistent with liver findings, peripherally enhancing liver lesions which are worrisome for metastatic disease Cannot r/o lymphangitic spread, willl verify with pulmonary Discussed with husband and daughter Will not be able to tolerate liver bx or any further invasive workup  Goals of care her breathing would likely not get better (given the poor response to steroids), and that her time was limited to likely less than a month.here was a chance for improvement, but the overall prognosis was poor. This poor prognosis is also supported by her new liver lesions that are concerning for metastatic disease. In light of this information, she does not want to pursue any further aggressive intervention (no lung or liver biopsy) and instead wants to focus on being comfortable and spending quality time with her family.   Discharge Exam:   Blood pressure 131/76, pulse 88, temperature 97.5 F (36.4 C), resp. rate 17, height 5' 2.5" (1.588 m), weight 65.1 kg (143 lb 9.6 oz), SpO2 92 %.  General: looks unwell Resp: Face mask o2, easy desats, crackles. Mild increased wob + CNS: AxOx3    Follow-up Information    [facility] Follow up.   Why:  HHRN/PT/OT/aide/resp- arranged- please allow 24-48 hrs  post discharge for them to call and arrange home visits Contact information: [address] [place] [address] [phone]        Inc[facility] - Dme [facility] Follow up.   Why:  3n1 and oximizer arranged- to be delivered to room prior to discharge.  Contact information: [place] [address] [phone]           Signed: Reyne Dumas 07/30/2016, 8:45 AM        Time spent >45 mins

## 2016-07-30 NOTE — Progress Notes (Signed)
OT Cancellation Note  Patient Details Name: Kristina Schroeder MRN: [id number] DOB: 1940/12/15   Cancelled Treatment:    Reason Eval/Treat Not Completed: Other (comment). Family deferred all further therapy stating they prefer to pursue hospice and comfort measures. Will sign off per pt and family request   Britt Bottom 07/30/2016, 1:10 PM

## 2016-07-30 NOTE — Progress Notes (Signed)
Dc tp hospice 07/30/2016  per Dr Allyson Sabal - pccm can sign off 07/30/2016   Dr. Brand Males, M.D., [facility].C.P Pulmonary and Critical Care Medicine Staff Physician [facility] Pager: [phone], If no answer or between  15:00h - 7:00h: call [phone]  07/30/2016 12:55 PM

## 2016-07-30 NOTE — Clinical Social Work Note (Signed)
Per MD patient ready for DC to [facility]. RN, patient, patient's family Delfino Lovett and Maudie Mercury), and facility notified of DC. RN given number for report. DC packet on chart. Ambulance transport requested for patient for 3:00PM pickup. CSW signing off.    Liz Beach MSW, [place], [place], [id number]

## 2016-07-30 NOTE — Care Management (Signed)
Case Management Note Initial Note Started By: Brandt Loosen, RN, on 07-27-16 Patient Details  Name: Kristina Schroeder MRN: [id number] Date of Birth: 15-Jan-1941  Subjective/Objective:   76 y.o. F admitted   07/20/2016 after syncopal episode. Pt has hx Pulmonary Fibrosis and Chronic Hypoxic Respiratory Failure. Have made [facility] aware she will need Oximizer prior to discharge an 3:1. PT has suggested may need w/c. HHPT/OT/RN/Resp Care/and HHaide. [facility] has accepted referral for all of above.                Action/Plan: Will continue to follow.    Expected Discharge Date:                         Expected Discharge Plan:  [place]  In-House Referral:     Discharge planning Services  CM Consult  Post Acute Care Choice:  Durable Medical Equipment, Home Health Choice offered to:  Patient  DME Arranged:  3-N-1, Pulse oximeter, Other see comment (Oximizer) DME Agency:  [facility] [place]:  RN, PT, OT, Respirator Therapy, Nurse's Aide, Disease Management [place] Agency:  [place]  Status of Service:  Completed, signed off  If discussed at [place] of Stay Meetings, dates discussed:  07-30-16   Additional Comments: [place] 07-30-16 Jacqlyn Krauss, RN,BSN [phone] [facility]. CSW assisting with disposition needs- referral made to [facility]idential [facility]. No further needs from CM at this time. Palliative Care Meeting to be done at 1230 if any changes- CM/ SW following.

## 2016-08-01 LAB — ACID FAST CULTURE WITH REFLEXED SENSITIVITIES: ACID FAST CULTURE - AFSCU3: NEGATIVE

## 2016-08-01 LAB — ACID FAST CULTURE WITH REFLEXED SENSITIVITIES (MYCOBACTERIA)

## 2016-09-08 DEATH — deceased

## 2016-09-23 ENCOUNTER — Ambulatory Visit: Payer: Medicare Other | Admitting: Pulmonary Disease

## 2016-11-18 ENCOUNTER — Ambulatory Visit (INDEPENDENT_AMBULATORY_CARE_PROVIDER_SITE_OTHER): Payer: Medicare Other | Admitting: Ophthalmology

## 2017-06-16 IMAGING — MR MR ABDOMEN WO/W CM
9 of 18 series · 21 of 48 positions shown · IV contrast (multihance)
Comparison: 05/29/2016

CLINICAL DATA: Evaluate liver lesions identified on recent chest CT

EXAM:
MRI ABDOMEN WITHOUT AND WITH CONTRAST
TECHNIQUE: Multiplanar multisequence MR imaging of the abdomen was performed
both before and after the administration of intravenous contrast.
CONTRAST:  15mL MULTIHANCE GADOBENATE DIMEGLUMINE 529 MG/ML IV SOLN

[Series 3: T2 fat-sat · axial · 5.0mm · 0.78mm/px · z∈[-62,+228]mm · 2 of 59 slices shown]
[im 1/59]
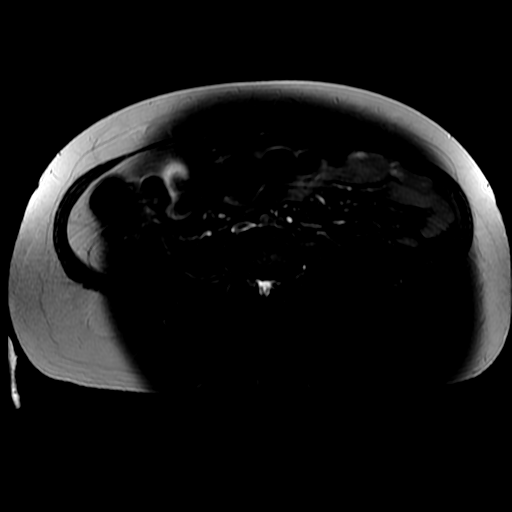
[im 59/59]
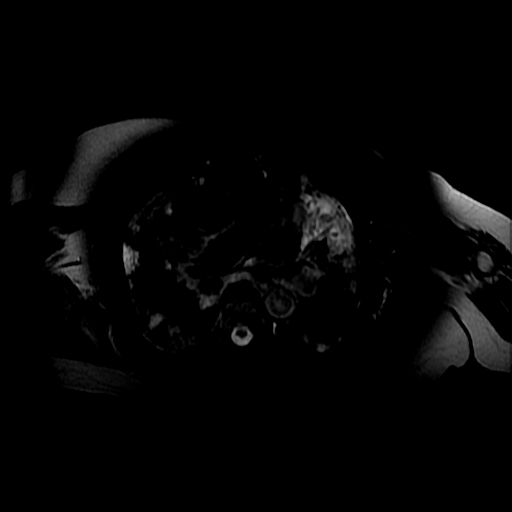

[Series 4: DWI b500 · axial · 6.0mm · 1.48mm/px · z∈[-74,+230]mm · 3 of 80 slices shown]
[im 1/80]
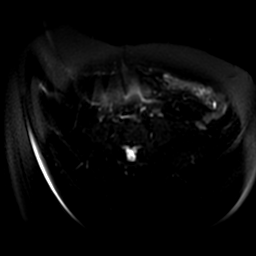
[im 40/80]
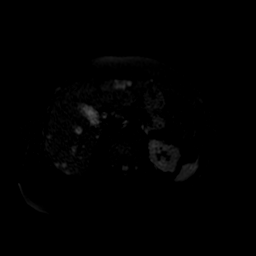
[im 80/80]
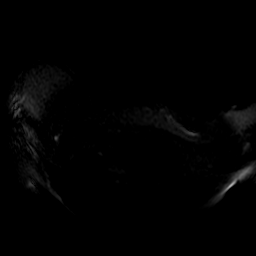

[Series 5: ax dualecho · axial · 5.0mm · 0.78mm/px · z∈[-62,+228]mm · 4 of 118 slices shown]
[im 1/118]
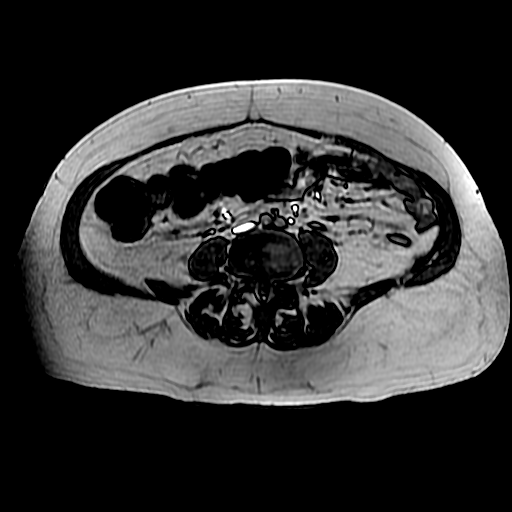
[im 40/118]
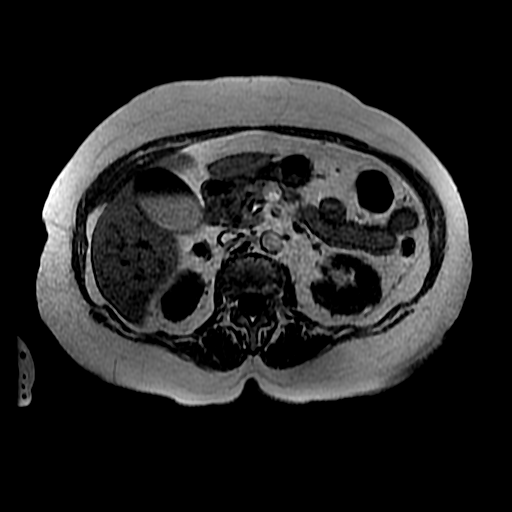
[im 79/118]
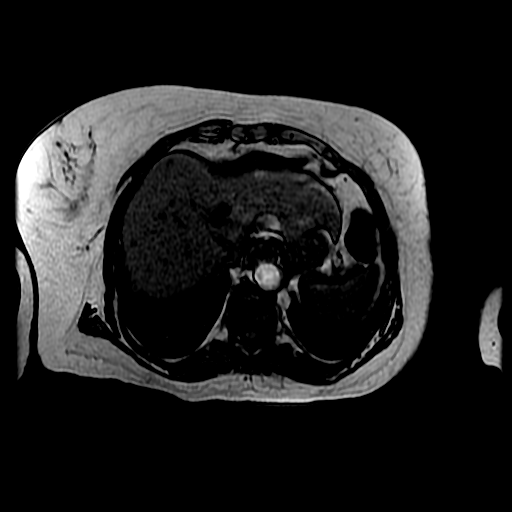
[im 118/118]
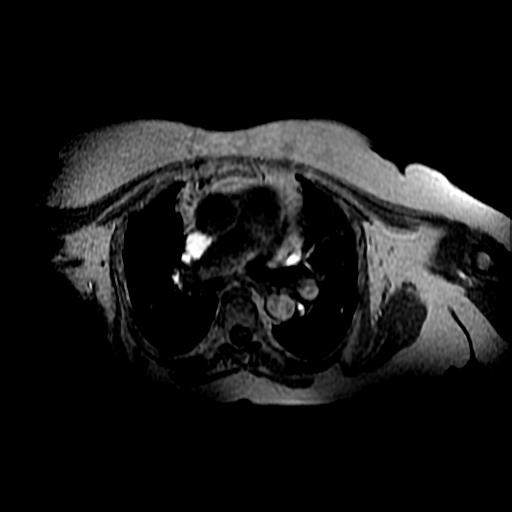

[Series 6: T2 · axial · 5.0mm · 0.78mm/px · z∈[-47,+228]mm · 2 of 56 slices shown (1 of 2)]
[im 1/56]
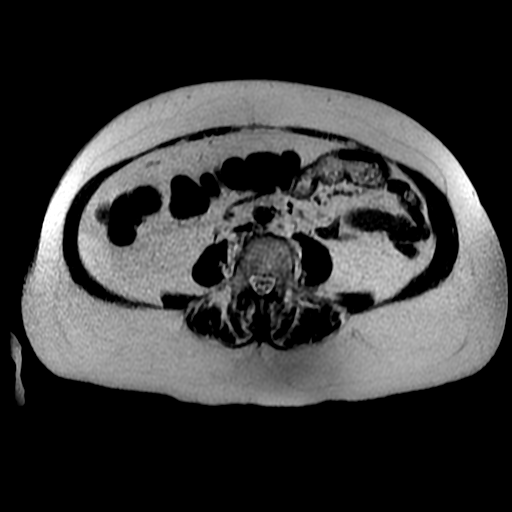
[im 56/56]
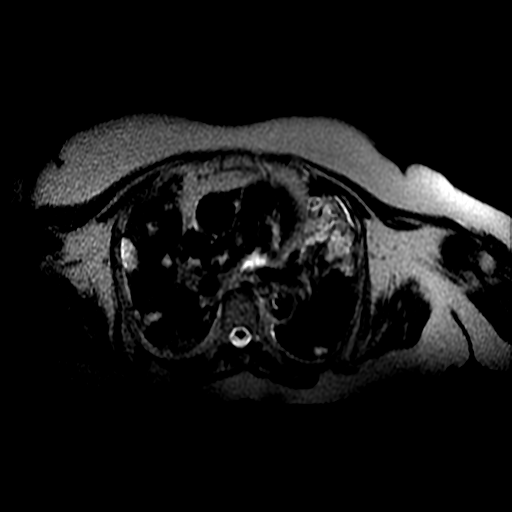

[Series 7: bSSFP · axial · 5.0mm · 0.78mm/px · z∈[-62,+228]mm · 2 of 59 slices shown]
[im 1/59]
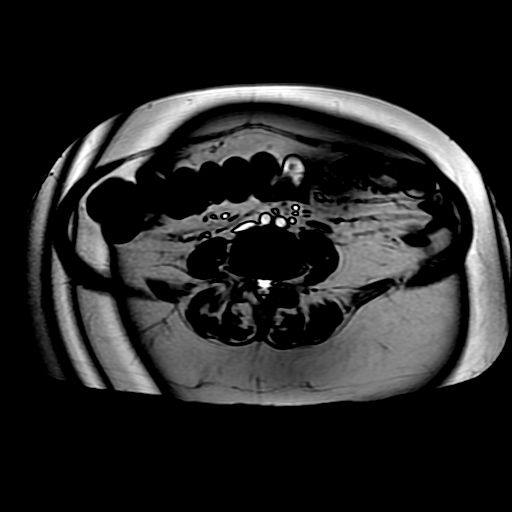
[im 59/59]
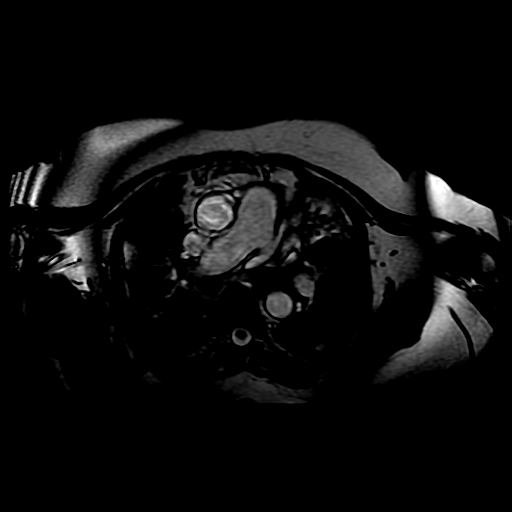

[Series 8: T2 · coronal · 5.0mm · 0.78mm/px · 1 of 40 slices shown (2 of 2)]
[im 1/40]
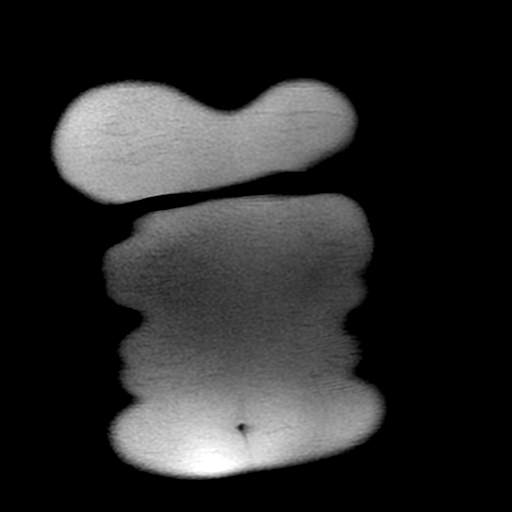

[Series 400: DWI · axial · 6.0mm · 1.48mm/px · 1 of 40 slices shown]
[im 1/40]
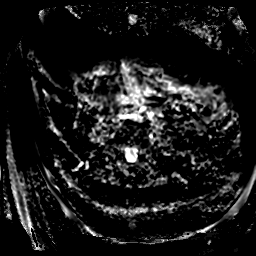

[Series 900: T1 dynamic · axial · 6.0mm · 0.86mm/px · z∈[-64,+197]mm · 3 of 88 slices shown (1 of 2)]
[im 1/88]
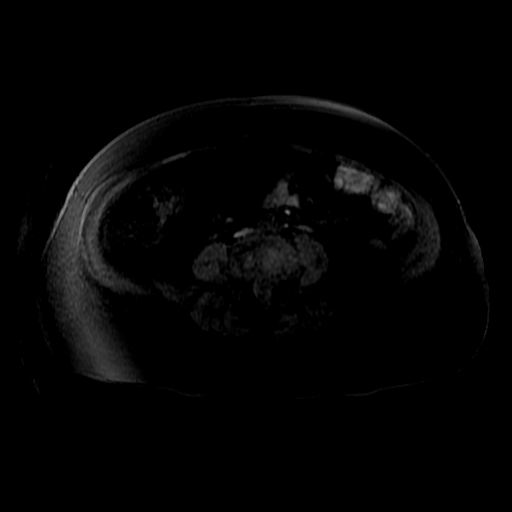
[im 44/88]
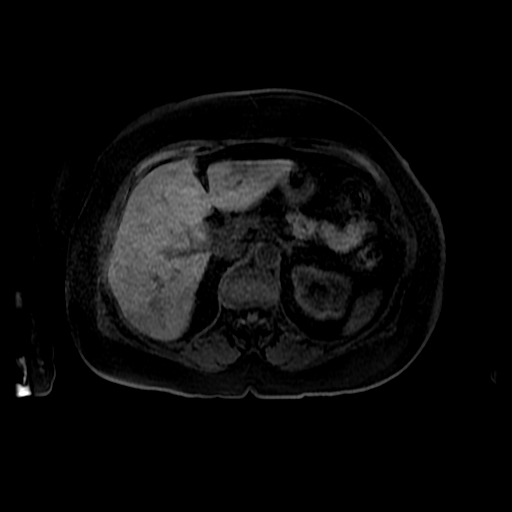
[im 88/88]
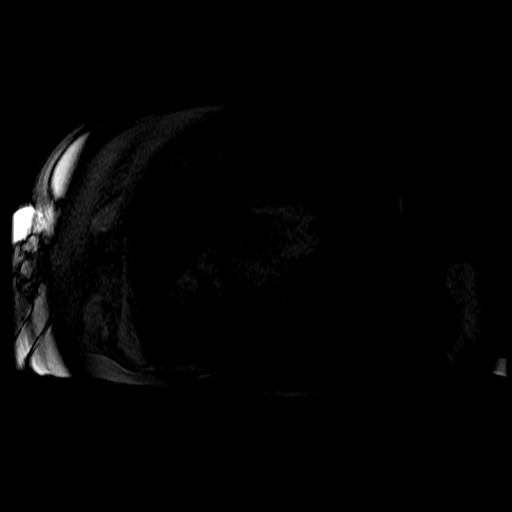

[Series 901: T1 dynamic · axial · 6.0mm · 0.86mm/px · z∈[-64,+197]mm · 3 of 88 slices shown (2 of 2)]
[im 1/88]
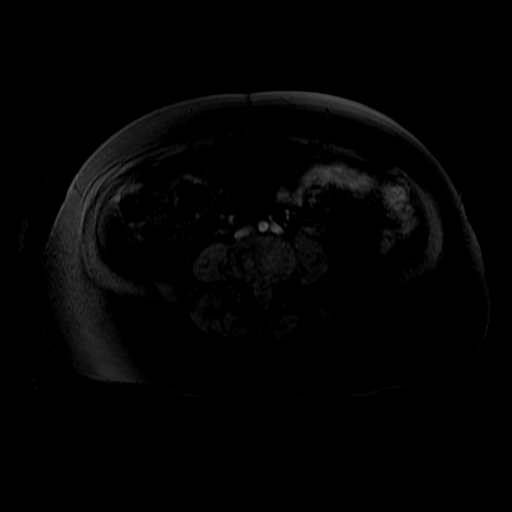
[im 44/88]
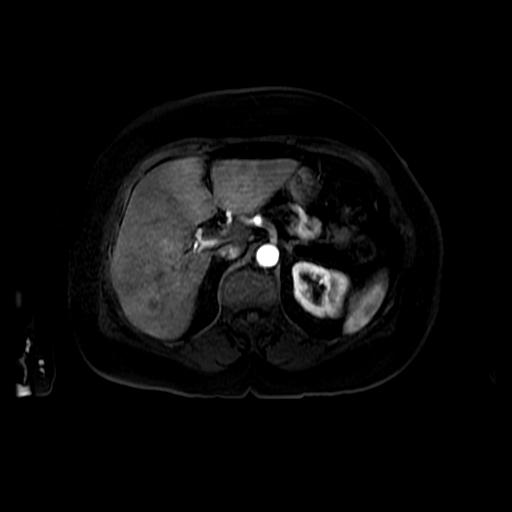
[im 88/88]
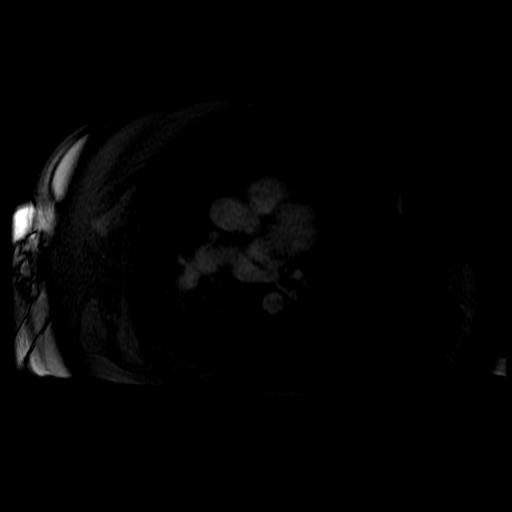

[21 of 48 positions shown; findings below may reference images not displayed]

FINDINGS: Lower chest: Extensive bilateral pulmonary lesions are again noted
better evaluated on recent chest CT from 05/29/2016.

Hepatobiliary: The gallbladder appears normal. No intrahepatic bile
duct dilatation. The common bile duct measures 7 mm in diameter.
Medial segment of left lobe of liver cyst is identified measuring
1.4 cm, image 16 of series 8. There are innumerable peripherally
enhancing (target lesions) within both lobes of liver. Findings are
worrisome for multifocal liver metastases. Lesions are too numerous
to count. Posterior right lobe of liver lesion measures 1.5 cm,
image 25 of series 3. Lesion within medial segment of left lobe
measures 1.4 cm, image 22 of series 3. Index lesion within the
lateral segment of left lobe of liver measures 2.7 cm, image 32
series 3.

Pancreas: No mass, inflammatory changes, or other parenchymal
abnormality identified.

Spleen:  Within normal limits in size and appearance.

Adrenals/Urinary Tract: No masses identified. No evidence of
hydronephrosis.

Stomach/Bowel: Visualized portions within the abdomen are
unremarkable.

Vascular/Lymphatic: No pathologically enlarged lymph nodes
identified. No abdominal aortic aneurysm demonstrated.

Other:  None.

Musculoskeletal: No suspicious bone lesions identified.
IMPRESSION: 1. The examination is positive for innumerable peripherally
enhancing liver lesions which are worrisome for metastatic disease.
2. Extensive bilateral pulmonary lesions. Given findings in the
liver these are worrisome for multifocal liver metastases. See CT
chest from 05/29/2016.
